# Patient Record
Sex: Male | Born: 1935 | Hispanic: No | Marital: Married | Smoking: Former smoker
Health system: Southern US, Community
[De-identification: ages and names within clinical notes are randomized; demographics above are authoritative.]

## PROBLEM LIST (undated history)

## (undated) DIAGNOSIS — M109 Gout, unspecified: Secondary | ICD-10-CM

## (undated) DIAGNOSIS — I10 Essential (primary) hypertension: Secondary | ICD-10-CM

## (undated) DIAGNOSIS — T8859XA Other complications of anesthesia, initial encounter: Secondary | ICD-10-CM

## (undated) DIAGNOSIS — M1990 Unspecified osteoarthritis, unspecified site: Secondary | ICD-10-CM

## (undated) DIAGNOSIS — T4145XA Adverse effect of unspecified anesthetic, initial encounter: Secondary | ICD-10-CM

## (undated) DIAGNOSIS — C61 Malignant neoplasm of prostate: Secondary | ICD-10-CM

## (undated) HISTORY — DX: Malignant neoplasm of prostate: C61

## (undated) HISTORY — PX: CATARACT EXTRACTION: SUR2

## (undated) HISTORY — PX: PROSTATE BIOPSY: SHX241

---

## 2001-08-20 ENCOUNTER — Encounter: Admission: RE | Admit: 2001-08-20 | Discharge: 2001-08-20 | Payer: Self-pay | Admitting: Internal Medicine

## 2003-05-22 HISTORY — PX: BACK SURGERY: SHX140

## 2009-05-10 ENCOUNTER — Encounter: Admission: RE | Admit: 2009-05-10 | Discharge: 2009-05-10 | Payer: Self-pay | Admitting: Neurosurgery

## 2011-06-22 ENCOUNTER — Other Ambulatory Visit: Payer: Self-pay | Admitting: Internal Medicine

## 2011-06-22 ENCOUNTER — Ambulatory Visit
Admission: RE | Admit: 2011-06-22 | Discharge: 2011-06-22 | Disposition: A | Discharge status: Home / Self Care | Payer: Medicare Other | Source: Ambulatory Visit | Attending: Internal Medicine | Admitting: Internal Medicine

## 2011-06-22 DIAGNOSIS — M79673 Pain in unspecified foot: Secondary | ICD-10-CM

## 2012-06-04 ENCOUNTER — Other Ambulatory Visit: Payer: Self-pay | Admitting: Neurosurgery

## 2012-06-04 DIAGNOSIS — M542 Cervicalgia: Secondary | ICD-10-CM

## 2012-06-07 ENCOUNTER — Ambulatory Visit
Admission: RE | Admit: 2012-06-07 | Discharge: 2012-06-07 | Disposition: A | Discharge status: Home / Self Care | Payer: Medicare Other | Source: Ambulatory Visit | Attending: Neurosurgery | Admitting: Neurosurgery

## 2012-06-07 DIAGNOSIS — M542 Cervicalgia: Secondary | ICD-10-CM

## 2012-08-21 ENCOUNTER — Other Ambulatory Visit: Payer: Self-pay | Admitting: Urology

## 2012-09-18 ENCOUNTER — Encounter (HOSPITAL_COMMUNITY): Payer: Self-pay | Admitting: Pharmacy Technician

## 2012-10-03 NOTE — Patient Instructions (Addendum)
[address] DEVONTA BLANFORD  10/03/2012   Your procedure is scheduled on: 10/10/12  Report to [facility] at 0515 AM.  Call this number if you have problems the morning of surgery [phone]: [phone]   Remember:   Do not eat food or drink liquids After Midnight.     Do not wear jewelry, make-up or nail polish.  Do not wear lotions, powders, or perfumes. You may wear deodorant.  Do not shave 48 hours prior to surgery. Men may shave face and neck.  Do not bring valuables to the hospital.  Contacts, dentures or bridgework may not be worn into surgery.  Leave suitcase in the car. After surgery it may be brought to your room.  For patients admitted to the hospital, checkout time is 11:00 AM the day of discharge.   Please read over the following fact sheets that you were given: MRSA Information.  Birdie Sons, RN  pre op nurse call if needed [phone]    FAILURE TO FOLLOW THESE INSTRUCTIONS MAY RESULT IN CANCELLATION OF YOUR SURGERY   Patient Signature: ___________________________________________

## 2012-10-03 NOTE — Progress Notes (Signed)
Office visit note on chart, EKG 05/09/11 on chart

## 2012-10-06 ENCOUNTER — Encounter (HOSPITAL_COMMUNITY): Payer: Self-pay

## 2012-10-06 ENCOUNTER — Ambulatory Visit (HOSPITAL_COMMUNITY)
Admission: RE | Admit: 2012-10-06 | Discharge: 2012-10-06 | Disposition: A | Discharge status: Home / Self Care | Payer: Medicare Other | Source: Ambulatory Visit | Attending: Urology | Admitting: Urology

## 2012-10-06 ENCOUNTER — Encounter (HOSPITAL_COMMUNITY)
Admission: RE | Admit: 2012-10-06 | Discharge: 2012-10-06 | Disposition: A | Discharge status: Home / Self Care | Payer: Medicare Other | Source: Ambulatory Visit | Attending: Urology | Admitting: Urology

## 2012-10-06 DIAGNOSIS — Z01810 Encounter for preprocedural cardiovascular examination: Secondary | ICD-10-CM | POA: Insufficient documentation

## 2012-10-06 DIAGNOSIS — Z01812 Encounter for preprocedural laboratory examination: Secondary | ICD-10-CM | POA: Insufficient documentation

## 2012-10-06 DIAGNOSIS — Z87891 Personal history of nicotine dependence: Secondary | ICD-10-CM | POA: Insufficient documentation

## 2012-10-06 DIAGNOSIS — I517 Cardiomegaly: Secondary | ICD-10-CM | POA: Insufficient documentation

## 2012-10-06 DIAGNOSIS — I10 Essential (primary) hypertension: Secondary | ICD-10-CM | POA: Insufficient documentation

## 2012-10-06 DIAGNOSIS — Z01818 Encounter for other preprocedural examination: Secondary | ICD-10-CM | POA: Insufficient documentation

## 2012-10-06 HISTORY — DX: Other complications of anesthesia, initial encounter: T88.59XA

## 2012-10-06 HISTORY — DX: Gout, unspecified: M10.9

## 2012-10-06 HISTORY — DX: Essential (primary) hypertension: I10

## 2012-10-06 HISTORY — DX: Adverse effect of unspecified anesthetic, initial encounter: T41.45XA

## 2012-10-06 HISTORY — DX: Unspecified osteoarthritis, unspecified site: M19.90

## 2012-10-06 LAB — BASIC METABOLIC PANEL
BUN: 21 mg/dL (ref 6–23)
CO2: 31 mEq/L (ref 19–32)
Calcium: 9.8 mg/dL (ref 8.4–10.5)
Chloride: 101 mEq/L (ref 96–112)
Creatinine, Ser: 0.85 mg/dL (ref 0.50–1.35)
GFR calc Af Amer: >90 mL/min (ref >90)
GFR calc non Af Amer: 80 mL/min — ABNORMAL LOW (ref >90)
Glucose, Bld: 94 mg/dL (ref 70–99)
Potassium: 4.6 mEq/L (ref 3.5–5.1)
Sodium: 140 mEq/L (ref 135–145)

## 2012-10-06 LAB — SURGICAL PCR SCREEN
MRSA, PCR: NEGATIVE
Staphylococcus aureus: POSITIVE — AB

## 2012-10-06 LAB — CBC
HCT: 42.7 % (ref 39.0–52.0)
Hemoglobin: 15 g/dL (ref 13.0–17.0)
MCH: 32.1 pg (ref 26.0–34.0)
MCHC: 35.1 g/dL (ref 30.0–36.0)
MCV: 91.2 fL (ref 78.0–100.0)
Platelets: 174 10*3/uL (ref 150–400)
RBC: 4.68 MIL/uL (ref 4.22–5.81)
RDW: 12.9 % (ref 11.5–15.5)
WBC: 4.7 10*3/uL (ref 4.0–10.5)

## 2012-10-06 NOTE — Progress Notes (Signed)
10/06/12 0929  OBSTRUCTIVE SLEEP APNEA  Have you ever been diagnosed with sleep apnea through a sleep study? No  Do you snore loudly (loud enough to be heard through closed doors)?  1  Do you often feel tired, fatigued, or sleepy during the daytime? 0  Has anyone observed you stop breathing during your sleep? 0  Do you have, or are you being treated for high blood pressure? 1  BMI more than 35 kg/m2? 0  Age over 77 years old? 1  Neck circumference greater than 40 cm/18 inches? 0  Gender: 1  Obstructive Sleep Apnea Score 4  Score 4 or greater  Results sent to PCP

## 2012-10-09 DIAGNOSIS — N401 Enlarged prostate with lower urinary tract symptoms: Secondary | ICD-10-CM

## 2012-10-09 DIAGNOSIS — N138 Other obstructive and reflux uropathy: Secondary | ICD-10-CM

## 2012-10-09 NOTE — H&P (Signed)
Seth Mccall is a 79 year old male Seth Mccall with a history of BPH and outlet obstruction.   History of Present Illness     BPH with LUTS: Seth Mccall has obstructive voiding symptoms consisting primarily of nocturia and slow urinary stream. In addition Seth Mccall reported that Seth Mccall had daytime frequency and fairly significant urgency. These symptoms have been present for about 3 years. Seth Mccall has tried Avodart but stopped that after Seth Mccall learned about the black box warning for that medication. Seth Mccall said after stopping the medicine Seth Mccall noted no real change in his voiding pattern. Seth Mccall said Seth Mccall also took tamsulosin but stopped that about a year ago as Seth Mccall noted no improvement with this medicine either. Seth Mccall said Seth Mccall is primarily bothered by his urgency. Seth Mccall had urodynamics in 11/11 which revealed a large capacity bladder of 700 cc with mild bladder instability at large volumes as well as some degree of outlet obstruction.  Seth Mccall underwent transurethral microwave therapy on 04/23/11.      Seth Mccall has had a history of kidney stones in the distant past but has not had any flank pain or hematuria for some time.   Interval history: Seth Mccall continues to be bothered by obstructive voiding symptoms. Seth Mccall came in today to discuss proceeding with surgical therapy.    Past Medical History Problems  1. History of  Edema Of The Penis 607.83 2. History of  Hypertension 401.9 3. History of  Nephrolithiasis V13.01 4. History of  Spinal Stenosis 724.00  Surgical History Problems  1. History of  Spine Repair  Current Meds 1. Aspirin 81 MG Oral Tablet; Therapy: (Recorded:14Nov2011) to 2. Caltrate 600+D TABS; Therapy: (Recorded:14Nov2011) to 3. Centrum Silver Oral Tablet; Therapy: (Recorded:14Nov2011) to 4. Colcrys 0.6 MG Oral Tablet; Therapy: 21Jan2013 to 5. Lisinopril 10 MG Oral Tablet; Therapy: (Recorded:14Nov2011) to 6. Rapaflo 8 MG Oral Capsule; TAKE 1 CAPSULE Bedtime; Therapy: 07Dec2011 to  (Evaluate:21Dec2012); Last Rx:22Dec2011 7.  Sulfamethoxazole-Trimethoprim 400-80 MG Oral Tablet; Take 1 tablet twice daily; Therapy:  16Dec2013 to (Last Rx:16Dec2013)  Requested for: 16Dec2013  Allergies Medication  1. No Known Drug Allergies  Family History Problems  1. Family history of  Family Health Status - Mother's Age Mother lives in [place] and is 104. 2. Family history of  Family Health Status Number Of Children 4 children 3. Family history of  Father Deceased At Age ____ Died at 61 from unknown causes.  Social History Problems  1. Alcohol Use 2. Caffeine Use 2 cups a day 3. Marital History - Currently Married 4. Never A Smoker Denied  5. History of  Former Smoker 6. History of  Tobacco Use  Review of Systems Genitourinary, constitutional, skin, eye, otolaryngeal, hematologic/lymphatic, cardiovascular, pulmonary, endocrine, musculoskeletal, gastrointestinal, neurological and psychiatric system(s) were reviewed and pertinent findings if present are noted.  Genitourinary: urinary frequency, feelings of urinary urgency, nocturia, difficulty starting the urinary stream, weak urinary stream, post-void dribbling and erectile dysfunction.  Gastrointestinal: constipation.    Vitals Vital Signs Height Weight BMI BSA  Weight: 150 lb  Blood Pressure  Blood Pressure: 145 / 76 Temperature  Temperature: 97.9 F Pulse  Heart Rate: 83  Physical Exam Constitutional: Well nourished and well developed. No acute distress.  ENT:. The ears and nose are normal in appearance.  Neck: The appearance of the neck is normal and no neck mass is present.  Pulmonary: No respiratory distress and normal respiratory rhythm and effort.  Cardiovascular: Heart rate and rhythm are normal. No peripheral edema.  Abdomen: The abdomen is soft and  nontender. No masses are palpated. No CVA tenderness. No hernias are palpable. No hepatosplenomegaly noted.  Rectal: Rectal exam demonstrates normal sphincter tone, no tenderness and no masses. The  prostate has no nodularity and is not tender. The left seminal vesicle is nonpalpable. The right seminal vesicle is nonpalpable. The perineum is normal on inspection.  Genitourinary: Examination of the penis demonstrates no discharge, no masses, no lesions and a normal meatus. The penis is uncircumcised. The scrotum is without lesions. The right epididymis is palpably normal and non-tender. The left epididymis is found to have a spermatocele, but non-tender. The right testis is non-tender and without masses. The left testis is non-tender and without masses.  Lymphatics: The femoral and inguinal nodes are not enlarged or tender.  Skin: Normal skin turgor, no visible rash and no visible skin lesions.  Neuro/Psych:. Mood and affect are appropriate.   The following clinical lab reports were reviewed:  The urine was clear.    Assessment Assessed  1. Benign Prostatic Hypertrophy With Urinary Obstruction 600.01   I long discussion with the Seth Mccall about proceeding with surgical treatment of his outlet obstruction. Seth Mccall was given information about transurethral resection of the prostate previously and is reviewed this. Seth Mccall would like to proceed with surgery. I therefore went over the procedure with him and also discussed performing the procedure with the greenlight laser. We discussed the pluses and minuses of each of these forms of therapy. I also went over the risks and potential complications. Seth Mccall understands and would like to proceed with greenlight laser ablation of his prostate. We discussed the probability of success, the anticipated postoperative course and Seth Mccall indicated that Seth Mccall would have his daughter available to assist him postoperatively.   Plan    Seth Mccall will be scheduled for a greenlight laser ablation of the prostate.

## 2012-10-10 ENCOUNTER — Encounter (HOSPITAL_COMMUNITY)
Admission: RE | Disposition: A | Discharge status: Home / Self Care | Payer: Self-pay | Source: Ambulatory Visit | Attending: Urology

## 2012-10-10 ENCOUNTER — Ambulatory Visit (HOSPITAL_COMMUNITY)
Admission: RE | Admit: 2012-10-10 | Discharge: 2012-10-11 | Disposition: A | Discharge status: Home / Self Care | Payer: Medicare Other | Source: Ambulatory Visit | Attending: Urology | Admitting: Urology

## 2012-10-10 ENCOUNTER — Encounter (HOSPITAL_COMMUNITY): Payer: Self-pay | Admitting: *Deleted

## 2012-10-10 ENCOUNTER — Encounter (HOSPITAL_COMMUNITY): Payer: Self-pay | Admitting: Certified Registered Nurse Anesthetist

## 2012-10-10 ENCOUNTER — Ambulatory Visit (HOSPITAL_COMMUNITY): Payer: Medicare Other | Admitting: Certified Registered Nurse Anesthetist

## 2012-10-10 DIAGNOSIS — N138 Other obstructive and reflux uropathy: Secondary | ICD-10-CM | POA: Insufficient documentation

## 2012-10-10 DIAGNOSIS — R3915 Urgency of urination: Secondary | ICD-10-CM | POA: Insufficient documentation

## 2012-10-10 DIAGNOSIS — Z79899 Other long term (current) drug therapy: Secondary | ICD-10-CM | POA: Insufficient documentation

## 2012-10-10 DIAGNOSIS — Z87442 Personal history of urinary calculi: Secondary | ICD-10-CM | POA: Insufficient documentation

## 2012-10-10 DIAGNOSIS — N139 Obstructive and reflux uropathy, unspecified: Secondary | ICD-10-CM | POA: Insufficient documentation

## 2012-10-10 DIAGNOSIS — K5900 Constipation, unspecified: Secondary | ICD-10-CM | POA: Insufficient documentation

## 2012-10-10 DIAGNOSIS — R350 Frequency of micturition: Secondary | ICD-10-CM | POA: Insufficient documentation

## 2012-10-10 DIAGNOSIS — R351 Nocturia: Secondary | ICD-10-CM | POA: Insufficient documentation

## 2012-10-10 DIAGNOSIS — N401 Enlarged prostate with lower urinary tract symptoms: Secondary | ICD-10-CM

## 2012-10-10 DIAGNOSIS — Z7982 Long term (current) use of aspirin: Secondary | ICD-10-CM | POA: Insufficient documentation

## 2012-10-10 DIAGNOSIS — I10 Essential (primary) hypertension: Secondary | ICD-10-CM | POA: Insufficient documentation

## 2012-10-10 DIAGNOSIS — N529 Male erectile dysfunction, unspecified: Secondary | ICD-10-CM | POA: Insufficient documentation

## 2012-10-10 HISTORY — PX: TRANSURETHRAL RESECTION OF PROSTATE: SHX73

## 2012-10-10 HISTORY — PX: GREEN LIGHT LASER TURP (TRANSURETHRAL RESECTION OF PROSTATE: SHX6260

## 2012-10-10 SURGERY — GREEN LIGHT LASER TURP (TRANSURETHRAL RESECTION OF PROSTATE
Anesthesia: General | Wound class: Clean Contaminated

## 2012-10-10 MED ORDER — OXYCODONE HCL 5 MG PO TABS: 5.0000 mg | ORAL_TABLET | Freq: Once | ORAL | Status: DC | PRN

## 2012-10-10 MED ORDER — PROMETHAZINE HCL 25 MG/ML IJ SOLN: 6.2500 mg | INTRAMUSCULAR | Status: DC | PRN

## 2012-10-10 MED ORDER — FENTANYL CITRATE 0.05 MG/ML IJ SOLN
INTRAMUSCULAR | Status: DC | PRN
  Administered 2012-10-10 (×2): 50 ug via INTRAVENOUS

## 2012-10-10 MED ORDER — HYDROMORPHONE HCL PF 1 MG/ML IJ SOLN: 0.2500 mg | INTRAMUSCULAR | Status: DC | PRN

## 2012-10-10 MED ORDER — OXYCODONE HCL 5 MG/5ML PO SOLN
5.0000 mg | Freq: Once | ORAL | Status: DC | PRN
  Filled 2012-10-10: qty 5

## 2012-10-10 MED ORDER — CIPROFLOXACIN IN D5W 200 MG/100ML IV SOLN
200.0000 mg | INTRAVENOUS | Status: AC
  Administered 2012-10-10: 200 mg via INTRAVENOUS
  Filled 2012-10-10: qty 100

## 2012-10-10 MED ORDER — SODIUM CHLORIDE 0.9 % IV SOLN
INTRAVENOUS | Status: DC
  Administered 2012-10-10: 100 mL/h via INTRAVENOUS

## 2012-10-10 MED ORDER — MEPERIDINE HCL 50 MG/ML IJ SOLN: 6.2500 mg | INTRAMUSCULAR | Status: DC | PRN

## 2012-10-10 MED ORDER — SODIUM CHLORIDE 0.9 % IR SOLN
Status: DC | PRN
  Administered 2012-10-10: 6000 mL

## 2012-10-10 MED ORDER — SODIUM CHLORIDE 0.9 % IR SOLN
Status: DC | PRN
  Administered 2012-10-10: 1000 mL

## 2012-10-10 MED ORDER — EPHEDRINE SULFATE 50 MG/ML IJ SOLN
INTRAMUSCULAR | Status: DC | PRN
  Administered 2012-10-10 (×2): 10 mg via INTRAVENOUS

## 2012-10-10 MED ORDER — ACETAMINOPHEN 10 MG/ML IV SOLN
INTRAVENOUS | Status: DC | PRN
  Administered 2012-10-10: 1000 mg via INTRAVENOUS

## 2012-10-10 MED ORDER — HYDROCODONE-ACETAMINOPHEN 7.5-325 MG PO TABS: 1.0000 | ORAL_TABLET | ORAL | Status: DC | PRN

## 2012-10-10 MED ORDER — ACETAMINOPHEN 325 MG PO TABS: 650.0000 mg | ORAL_TABLET | ORAL | Status: DC | PRN

## 2012-10-10 MED ORDER — ACETAMINOPHEN 10 MG/ML IV SOLN: 1000.0000 mg | Freq: Once | INTRAVENOUS | Status: DC | PRN

## 2012-10-10 MED ORDER — CIPROFLOXACIN IN D5W 200 MG/100ML IV SOLN
200.0000 mg | Freq: Two times a day (BID) | INTRAVENOUS | Status: AC
  Administered 2012-10-10 – 2012-10-11 (×2): 200 mg via INTRAVENOUS
  Filled 2012-10-10 (×2): qty 100

## 2012-10-10 MED ORDER — HYDROCODONE-ACETAMINOPHEN 5-325 MG PO TABS: 1.0000 | ORAL_TABLET | ORAL | Status: DC | PRN

## 2012-10-10 MED ORDER — LACTATED RINGERS IV SOLN
INTRAVENOUS | Status: DC | PRN
  Administered 2012-10-10 (×2): via INTRAVENOUS

## 2012-10-10 MED ORDER — STERILE WATER FOR IRRIGATION IR SOLN
Status: DC | PRN
  Administered 2012-10-10: 500 mL

## 2012-10-10 MED ORDER — LIDOCAINE HCL (CARDIAC) 20 MG/ML IV SOLN
INTRAVENOUS | Status: DC | PRN
  Administered 2012-10-10: 50 mg via INTRAVENOUS

## 2012-10-10 MED ORDER — ZOLPIDEM TARTRATE 5 MG PO TABS: 5.0000 mg | ORAL_TABLET | Freq: Every evening | ORAL | Status: DC | PRN

## 2012-10-10 MED ORDER — BELLADONNA ALKALOIDS-OPIUM 16.2-60 MG RE SUPP: 1.0000 | Freq: Four times a day (QID) | RECTAL | Status: DC | PRN

## 2012-10-10 MED ORDER — PHENAZOPYRIDINE HCL 200 MG PO TABS: 200.0000 mg | ORAL_TABLET | Freq: Three times a day (TID) | ORAL | Status: DC | PRN

## 2012-10-10 MED ORDER — ACETAMINOPHEN 10 MG/ML IV SOLN
INTRAVENOUS | Status: AC
  Filled 2012-10-10: qty 100

## 2012-10-10 MED ORDER — PROPOFOL 10 MG/ML IV BOLUS
INTRAVENOUS | Status: DC | PRN
  Administered 2012-10-10: 160 mg via INTRAVENOUS

## 2012-10-10 MED ORDER — ONDANSETRON HCL 4 MG/2ML IJ SOLN: 4.0000 mg | INTRAMUSCULAR | Status: DC | PRN

## 2012-10-10 MED ORDER — BACITRACIN-NEOMYCIN-POLYMYXIN 400-5-5000 EX OINT: 1.0000 "application " | TOPICAL_OINTMENT | Freq: Three times a day (TID) | CUTANEOUS | Status: DC | PRN

## 2012-10-10 SURGICAL SUPPLY — 22 items
BAG URINE DRAINAGE (UROLOGICAL SUPPLIES) ×2 IMPLANT
BAG URO CATCHER STRL LF (DRAPE) ×2 IMPLANT
CATH FOLEY 2WAY SLVR  5CC 18FR (CATHETERS) ×1
CATH FOLEY 2WAY SLVR 30CC 22FR (CATHETERS) IMPLANT
CATH FOLEY 2WAY SLVR 5CC 18FR (CATHETERS) IMPLANT
CLOTH BEACON ORANGE TIMEOUT ST (SAFETY) ×2 IMPLANT
DRAPE CAMERA CLOSED 9X96 (DRAPES) ×2 IMPLANT
ELECT BUTTON HF 24-28F 2 30DE (ELECTRODE) IMPLANT
ELECT LOOP MED HF 24F 12D (CUTTING LOOP) IMPLANT
ELECT LOOP MED HF 24F 12D CBL (CLIP) IMPLANT
ELECT RESECT VAPORIZE 12D CBL (ELECTRODE) IMPLANT
FEE RENTAL LASER GREENLIGHT (Laser) IMPLANT
GLOVE BIO SURGEON STRL SZ8 (GLOVE) ×1 IMPLANT
GLOVE BIOGEL M STRL SZ7.5 (GLOVE) ×1 IMPLANT
GOWN STRL REIN XL XLG (GOWN DISPOSABLE) ×1 IMPLANT
HOLDER FOLEY CATH W/STRAP (MISCELLANEOUS) ×1 IMPLANT
LASER FIBER /GREENLIGHT LASER (Laser) ×1 IMPLANT
LASER GREENLIGHT RENTAL P/PROC (Laser) ×2 IMPLANT
PACK CYSTO (CUSTOM PROCEDURE TRAY) ×2 IMPLANT
SYR 30ML LL (SYRINGE) IMPLANT
SYRINGE IRR TOOMEY STRL 70CC (SYRINGE) IMPLANT
TUBING CONNECTING 10 (TUBING) ×2 IMPLANT

## 2012-10-10 NOTE — Op Note (Signed)
PATIENT:  Seth Mccall  PRE-OPERATIVE DIAGNOSIS:  BPH with outlet obstruction  POST-OPERATIVE DIAGNOSIS: Same  PROCEDURE: Green light laser vaporization of prostate  SURGEON:  Garnett Farm  INDICATION: Seth Mccall is a 79 year old male with long-standing bladder outlet obstruction secondary to BPH. He had undergone a previous minimally invasive procedure without significant improvement in his voiding pattern. We discussed the treatment options and he has elected to proceed with surgery.  ANESTHESIA:  General  EBL:  Minimal  DRAINS: 18 French Foley  LOCAL MEDICATIONS USED:  None  SPECIMEN:  None  Description of procedure: After informed consent the patient was brought to the major OR, placed on the table and administered general anesthesia. He was then moved to the dorsal lithotomy position and his genitalia was sterilely prepped and draped. An official timeout was then performed. He received preoperative antibiotics and had PAS hose on.  Cystoscopy was initially performed with the rigid cystoscope and 30 lens. Ureter was noted be normal down to the sphincter which appeared intact. The prostatic urethra revealed a fairly short urethra but a median lobe component with some lateral lobe hypertrophy. The bladder was then entered and fully inspected there were no tumors, stones or inflammatory lesions. One plus trabeculation was noted. Ureteral orifices were noted to be of normal configuration and position and well away from the bladder neck.  The greenlight laser was then inserted and I began vaporizing the adenomatous tissue using 60 W setting at the bladder neck from the 5 to 7:00 position down to capsular fibers and then back to the level of the veru. I then addressed the lateral lobes. I first vaporized the right lobe using 120 W of energy. Once this was completely vaporized I then addressed the left lobe. I then reduced the wattage back down to 80 W and treated with  the anterior tissue and then further reduced of energy down to 60 W and treated tissue near the apex remaining proximal to the veru at all times. I then fulgurated a single bleeder. I then left the bladder filled and removed the cystoscope. There was good flow of urine and this remained clear. I therefore inserted an 62 French Foley catheter. This was connected to closed system drainage after irrigation revealed clear return. Total laser time was 12 minutes and 49 seconds. The patient tolerated procedure well no intraoperative complications.  PLAN OF CARE: Due to his age he will be observed overnight with anticipated discharge in the morning after a voiding trial.  PATIENT DISPOSITION:  PACU - hemodynamically stable.

## 2012-10-10 NOTE — Progress Notes (Signed)
Patient ID: Seth Mccall, male   DOB: March 31, 1930, 79 y.o.   MRN: [id number] Postoperative check.  He had no specific complaints. No abdominal discomfort was reported.  He was afebrile with stable vital signs. His urine was completely clear with no clots. His abdomen was soft and nontender.  He is status post green light laser prostate ablation and appears to be doing well.  1. He is scheduled to have his catheter removed first thing in the morning. 2. He may be discharged home in the morning. His prescriptions have been written, followup entered and instructions entered.

## 2012-10-10 NOTE — Interval H&P Note (Signed)
History and Physical Interval Note:  10/10/2012 7:12 AM  Seth Mccall  has presented today for surgery, with the diagnosis of BPH  The various methods of treatment have been discussed with the patient and family. After consideration of risks, benefits and other options for treatment, the patient has consented to  Procedure(s): GREEN LIGHT LASER TURP (TRANSURETHRAL RESECTION OF PROSTATE (N/A) as a surgical intervention .  The patient's history has been reviewed, patient examined, no change in status, stable for surgery.  I have reviewed the patient's chart and labs.  Questions were answered to the patient's satisfaction.     Garnett Farm

## 2012-10-10 NOTE — Anesthesia Preprocedure Evaluation (Addendum)
Anesthesia Evaluation  Patient identified by MRN, date of birth, ID band Patient awake    Reviewed: Allergy & Precautions, H&P , NPO status , Patient's Chart, lab work & pertinent test results  History of Anesthesia Complications Negative for: history of anesthetic complications  Airway Mallampati: II TM Distance: >3 FB Neck ROM: Full    Dental  (+) Dental Advisory Given and Teeth Intact   Pulmonary former smoker,  breath sounds clear to auscultation        Cardiovascular hypertension, Pt. on medications Rhythm:Regular Rate:Normal     Neuro/Psych negative neurological ROS  negative psych ROS   GI/Hepatic negative GI ROS, Neg liver ROS,   Endo/Other  negative endocrine ROS  Renal/GU negative Renal ROS     Musculoskeletal negative musculoskeletal ROS (+)   Abdominal   Peds  Hematology negative hematology ROS (+)   Anesthesia Other Findings   Reproductive/Obstetrics                          Anesthesia Physical Anesthesia Plan  ASA: II  Anesthesia Plan: General   Post-op Pain Management:    Induction: Intravenous  Airway Management Planned: LMA  Additional Equipment:   Intra-op Plan:   Post-operative Plan: Extubation in OR  Informed Consent: I have reviewed the patients History and Physical, chart, labs and discussed the procedure including the risks, benefits and alternatives for the proposed anesthesia with the patient or authorized representative who has indicated his/her understanding and acceptance.   Dental advisory given  Plan Discussed with: CRNA  Anesthesia Plan Comments:         Anesthesia Quick Evaluation

## 2012-10-10 NOTE — Anesthesia Postprocedure Evaluation (Signed)
Anesthesia Post Note  Patient: Seth Mccall  Procedure(s) Performed: Procedure(s) (LRB): GREEN LIGHT LASER TURP (TRANSURETHRAL RESECTION OF PROSTATE (N/A)  Anesthesia type: General  Patient location: PACU  Post pain: Pain level controlled  Post assessment: Post-op Vital signs reviewed  Last Vitals: BP 108/55  Pulse 64  Temp(Src) 36.3 C (Oral)  Resp 20  Ht 5\' 3"  (1.6 m)  Wt 161 lb (73.029 kg)  BMI 28.53 kg/m2  SpO2 98%  Post vital signs: Reviewed  Level of consciousness: sedated  Complications: No apparent anesthesia complications

## 2012-10-10 NOTE — Transfer of Care (Signed)
Immediate Anesthesia Transfer of Care Note  Patient: Seth Mccall  Procedure(s) Performed: Procedure(s): GREEN LIGHT LASER TURP (TRANSURETHRAL RESECTION OF PROSTATE (N/A)  Patient Location: PACU  Anesthesia Type:General  Level of Consciousness: awake and alert   Airway & Oxygen Therapy: Patient Spontanous Breathing and Patient connected to face mask oxygen  Post-op Assessment: Report given to PACU RN and Post -op Vital signs reviewed and stable  Post vital signs: Reviewed and stable  Complications: No apparent anesthesia complications

## 2012-10-11 ENCOUNTER — Emergency Department (HOSPITAL_COMMUNITY)
Admission: EM | Admit: 2012-10-11 | Discharge: 2012-10-11 | Disposition: A | Discharge status: Home / Self Care | Payer: Medicare Other | Attending: Emergency Medicine | Admitting: Emergency Medicine

## 2012-10-11 ENCOUNTER — Encounter (HOSPITAL_COMMUNITY): Payer: Self-pay

## 2012-10-11 DIAGNOSIS — I10 Essential (primary) hypertension: Secondary | ICD-10-CM | POA: Insufficient documentation

## 2012-10-11 DIAGNOSIS — R109 Unspecified abdominal pain: Secondary | ICD-10-CM | POA: Insufficient documentation

## 2012-10-11 DIAGNOSIS — M109 Gout, unspecified: Secondary | ICD-10-CM | POA: Insufficient documentation

## 2012-10-11 DIAGNOSIS — Z7982 Long term (current) use of aspirin: Secondary | ICD-10-CM | POA: Insufficient documentation

## 2012-10-11 DIAGNOSIS — R339 Retention of urine, unspecified: Secondary | ICD-10-CM | POA: Insufficient documentation

## 2012-10-11 DIAGNOSIS — Z87891 Personal history of nicotine dependence: Secondary | ICD-10-CM | POA: Insufficient documentation

## 2012-10-11 DIAGNOSIS — Z79899 Other long term (current) drug therapy: Secondary | ICD-10-CM | POA: Insufficient documentation

## 2012-10-11 DIAGNOSIS — M129 Arthropathy, unspecified: Secondary | ICD-10-CM | POA: Insufficient documentation

## 2012-10-11 DIAGNOSIS — Z9889 Other specified postprocedural states: Secondary | ICD-10-CM | POA: Insufficient documentation

## 2012-10-11 LAB — URINALYSIS, ROUTINE W REFLEX MICROSCOPIC
Bilirubin Urine: NEGATIVE
Leukocytes, UA: NEGATIVE
Nitrite: NEGATIVE
Specific Gravity, Urine: 1.008 (ref 1.005–1.030)
Urobilinogen, UA: 0.2 mg/dL (ref 0.0–1.0)

## 2012-10-11 LAB — POCT I-STAT, CHEM 8
Calcium, Ion: 1.19 mmol/L (ref 1.13–1.30)
Glucose, Bld: 104 mg/dL — ABNORMAL HIGH (ref 70–99)
HCT: 43 % (ref 39.0–52.0)
Hemoglobin: 14.6 g/dL (ref 13.0–17.0)
Potassium: 4.8 mEq/L (ref 3.5–5.1)

## 2012-10-11 LAB — URINE MICROSCOPIC-ADD ON

## 2012-10-11 NOTE — ED Provider Notes (Signed)
History    CSN: [id number] Arrival date & time 10/11/12  1656 First MD Initiated Contact with Patient 10/11/12 1731    Chief Complaint  Patient presents with  . Urinary Retention   HPI Patient presents to the emergency room with complaints of urinary retention. The patient had a TURP procedure yesterday by Dr. Vernie Ammons.  He had his Foley catheter removed today at 11 AM. Since that time he has not been able to urinate. Patient states his bladder feels extremely full and distended. He also feels like he has to have a bowel movement now. He does have pain in his lower abdomen associated with that inability to urinate. He has not had any fever. He has not had any chills. He has not had any vomiting or diarrhea.  Past Medical History  Diagnosis Date  . Hypertension   . Arthritis   . Gout   . Complication of anesthesia     limited movement in neck because of fusion    Past Surgical History  Procedure Laterality Date  . Back surgery  2005    fusion of C3, C4, and C5  . Cataract extraction Bilateral   . Transurethral resection of prostate  10-10-2012    History reviewed. No pertinent family history.  History  Substance Use Topics  . Smoking status: Former Smoker -- 0.50 packs/day for 4 years    Types: Cigarettes    Quit date: 05/21/1968  . Smokeless tobacco: Never Used  . Alcohol Use: Yes     Comment: occasional      Review of Systems  All other systems reviewed and are negative.    Allergies  Review of patient's allergies indicates no known allergies.  Home Medications   Current Outpatient Rx  Name  Route  Sig  Dispense  Refill  . aspirin EC 81 MG tablet   Oral   Take 81 mg by mouth every other day.         . colchicine 0.6 MG tablet   Oral   Take 0.6 mg by mouth every other day.         . lisinopril (PRINIVIL,ZESTRIL) 10 MG tablet   Oral   Take 10 mg by mouth every morning. Take with a 5 mg tablet to make 15 mg         . lisinopril (PRINIVIL,ZESTRIL) 5 MG  tablet   Oral   Take 5 mg by mouth every morning. Take with a 10 mg tablet to make 15 mg         . Multiple Vitamin (MULTIVITAMIN WITH MINERALS) TABS   Oral   Take 1 tablet by mouth daily.         . phenazopyridine (PYRIDIUM) 200 MG tablet   Oral   Take 1 tablet (200 mg total) by mouth 3 (three) times daily as needed for pain.   30 tablet   0   . HYDROcodone-acetaminophen (NORCO) 7.5-325 MG per tablet   Oral   Take 1-2 tablets by mouth every 4 (four) hours as needed for pain.   40 tablet   0     BP 168/92  Pulse 77  Temp(Src) 98.3 F (36.8 C) (Oral)  Resp 20  SpO2 98%  Physical Exam  Nursing note and vitals reviewed. Constitutional: He appears well-developed and well-nourished. No distress.  HENT:  Head: Normocephalic and atraumatic.  Right Ear: External ear normal.  Left Ear: External ear normal.  Eyes: Conjunctivae are normal. Right eye exhibits no discharge. Left eye  exhibits no discharge. No scleral icterus.  Neck: Neck supple. No tracheal deviation present.  Cardiovascular: Normal rate, regular rhythm and intact distal pulses.   Pulmonary/Chest: Effort normal and breath sounds normal. No stridor. No respiratory distress. He has no wheezes. He has no rales.  Abdominal: Soft. Bowel sounds are normal. He exhibits mass. He exhibits no distension. There is tenderness in the suprapubic area. There is guarding. There is no rebound. No hernia.  Palpable mass in the suprapubic region consistent with a distended bladder  Musculoskeletal: He exhibits no edema and no tenderness.  Neurological: He is alert. He has normal strength. No sensory deficit. Cranial nerve deficit:  no gross defecits noted. He exhibits normal muscle tone. He displays no seizure activity. Coordination normal.  Skin: Skin is warm and dry. No rash noted.  Psychiatric: He has a normal mood and affect.    ED Course  Procedures (including critical care time) ED Course Foley catheter (coude) inserted  by RN without difficulty.  Pt tolerated well.  Large volume of urine obtained.  Approx 800 cc.   Labs Reviewed  URINALYSIS, ROUTINE W REFLEX MICROSCOPIC - Abnormal; Notable for the following:    Hgb urine dipstick MODERATE (*)    All other components within normal limits  POCT I-STAT, CHEM 8 - Abnormal; Notable for the following:    Glucose, Bld 104 (*)    All other components within normal limits  URINE MICROSCOPIC-ADD ON   No results found.   1. Urinary retention       MDM  Pt had urinary retention following his recent surgery. He improved following catheter insertion.  Discussed with Dr. Laverle Patter.  Will follow up in the office with Dr Vernie Ammons next week.  Pt will leave with a catheter in place.         Celene Kras, MD 10/11/12 [phone]

## 2012-10-11 NOTE — ED Notes (Signed)
Coude catheter inserted by P. Crawford Givens, RN

## 2012-10-11 NOTE — Progress Notes (Signed)
Patient ID: Jameis Ruben Im, male   DOB: 1929-06-07, 79 y.o.   MRN: [id number]  Date of admission: 10/10/2012  Date of discharge: 10/11/2012  Admission diagnosis: Bladder outlet obstruction  Discharge diagnosis: Same  History and Physical: For full details, please see admission history and physical. Briefly, Banner LOCKLAN CANOY is a 79 y.o. year old patient with bladder outlet obstruction.   Hospital Course: He underwent TUVP of the prostate yesterday.  His urine remained clear and his catheter was removed for a voiding trial which he was able to pass.  He was felt to be stable for discharge home.  Disposition: Home  Discharge instruction: The patient was instructed to be ambulatory but told to refrain from heavy lifting, strenuous activity, or driving.   Discharge medications:    Medication List    STOP taking these medications       silodosin 8 MG Caps capsule  Commonly known as:  RAPAFLO      TAKE these medications       aspirin EC 81 MG tablet  Take 81 mg by mouth every other day.     colchicine 0.6 MG tablet  Take 0.6 mg by mouth every other day.     HYDROcodone-acetaminophen 7.5-325 MG per tablet  Commonly known as:  NORCO  Take 1-2 tablets by mouth every 4 (four) hours as needed for pain.     lisinopril 10 MG tablet  Commonly known as:  PRINIVIL,ZESTRIL  Take 10 mg by mouth every morning. Take with a 5 mg tablet to make 15 mg     lisinopril 5 MG tablet  Commonly known as:  PRINIVIL,ZESTRIL  Take 5 mg by mouth every morning. Take with a 10 mg tablet to make 15 mg     multivitamin with minerals Tabs  Take 1 tablet by mouth daily.     phenazopyridine 200 MG tablet  Commonly known as:  PYRIDIUM  Take 1 tablet (200 mg total) by mouth 3 (three) times daily as needed for pain.        Followup:      Follow-up Information   Follow up with Garnett Farm, MD On 10/20/2012. (at 11:00)    Contact information:   [address] [facility] [place]  [address] [phone]

## 2012-10-11 NOTE — Progress Notes (Signed)
Patient discharge to home, daughter at bedside. Alel to urinate 2x prior to discharge light yellow . Patient no complaints of any pain or discomfort. Discharge teaching and follow up appointment done and was given to the patient.

## 2012-10-11 NOTE — ED Notes (Signed)
He states he underwent T.U.R.P. Yesterday by Dr. Vernie Ammons.  He states he was released today, and was able to void this morning; but unable to do so since 1300 hours today.  He states he feels "real full; and I feel like I need to have a b.m.".

## 2012-10-11 NOTE — ED Notes (Signed)
Asked P.Crawford Givens, RN to attempt catheter insertion.

## 2012-10-14 ENCOUNTER — Encounter (HOSPITAL_COMMUNITY): Payer: Self-pay | Admitting: Urology

## 2014-09-21 ENCOUNTER — Other Ambulatory Visit: Payer: Self-pay | Admitting: Internal Medicine

## 2014-09-21 DIAGNOSIS — Z9181 History of falling: Secondary | ICD-10-CM

## 2014-09-21 DIAGNOSIS — R42 Dizziness and giddiness: Secondary | ICD-10-CM

## 2014-09-22 ENCOUNTER — Other Ambulatory Visit: Payer: Self-pay

## 2014-09-22 ENCOUNTER — Ambulatory Visit
Admission: RE | Admit: 2014-09-22 | Discharge: 2014-09-22 | Disposition: A | Discharge status: Home / Self Care | Payer: Medicare Other | Source: Ambulatory Visit | Attending: Internal Medicine | Admitting: Internal Medicine

## 2014-09-22 DIAGNOSIS — Z9181 History of falling: Secondary | ICD-10-CM

## 2014-09-22 DIAGNOSIS — R42 Dizziness and giddiness: Secondary | ICD-10-CM

## 2015-04-25 ENCOUNTER — Ambulatory Visit: Payer: PPO | Admitting: Radiation Oncology

## 2015-04-27 ENCOUNTER — Encounter: Payer: Self-pay | Admitting: Radiation Oncology

## 2015-04-27 NOTE — Progress Notes (Signed)
GU Location of Tumor / Histology: prostatic adenocarcinoma   If Prostate Cancer, Gleason Score is (3 + 4) and PSA is (7.32) August 2016  Emeril H Quinney has been followed by Dr. Karsten Ro since May 2014 for s/p TURP for BPH.  Biopsies of prostate (if applicable) revealed:    Past/Anticipated interventions by urology, if any: Transurethral microwave therapy on 04/23/11 and greenlight laser vaporization 10/10/12. Prostate biopsy. Referral to Dr. Tammi Klippel.  Past/Anticipated interventions by medical oncology, if any: no  Weight changes, if any: no  Bowel/Bladder complaints, if any: nocturia, slow urinary stream, daytime frequency, fairly significant urgency (pt.primary concern).   Nausea/Vomiting, if any: no  Pain issues, if any:  no  SAFETY ISSUES:  Prior radiation? no  Pacemaker/ICD? no  Possible current pregnancy? no  Is the patient on methotrexate? no  Current Complaints / other details:  81 year old male. Married with four children. Mother 104 lives in [place]. Former smoker. Prostate volume 13.4 cc. Stopped Flomax and Avodart after he saw no change in his LUTS. He has previous prostate surgery which could make radioactive seed implantation more challenging.

## 2015-04-28 ENCOUNTER — Ambulatory Visit: Payer: PPO | Admitting: Radiation Oncology

## 2015-04-28 ENCOUNTER — Ambulatory Visit
Admission: RE | Admit: 2015-04-28 | Discharge: 2015-04-28 | Disposition: A | Discharge status: Home / Self Care | Payer: PPO | Source: Ambulatory Visit | Attending: Radiation Oncology | Admitting: Radiation Oncology

## 2015-04-28 ENCOUNTER — Ambulatory Visit: Payer: Medicare Other

## 2015-04-28 ENCOUNTER — Encounter: Payer: Self-pay | Admitting: Radiation Oncology

## 2015-04-28 VITALS — BP 144/70 | HR 64 | Temp 98.2°F | Resp 12 | Wt 160.0 lb

## 2015-04-28 DIAGNOSIS — C61 Malignant neoplasm of prostate: Secondary | ICD-10-CM | POA: Diagnosis not present

## 2015-04-28 DIAGNOSIS — Z87891 Personal history of nicotine dependence: Secondary | ICD-10-CM | POA: Diagnosis not present

## 2015-04-28 DIAGNOSIS — M109 Gout, unspecified: Secondary | ICD-10-CM | POA: Diagnosis not present

## 2015-04-28 DIAGNOSIS — Z79899 Other long term (current) drug therapy: Secondary | ICD-10-CM | POA: Diagnosis not present

## 2015-04-28 DIAGNOSIS — I10 Essential (primary) hypertension: Secondary | ICD-10-CM | POA: Insufficient documentation

## 2015-04-28 DIAGNOSIS — N401 Enlarged prostate with lower urinary tract symptoms: Secondary | ICD-10-CM

## 2015-04-28 DIAGNOSIS — M1990 Unspecified osteoarthritis, unspecified site: Secondary | ICD-10-CM | POA: Insufficient documentation

## 2015-04-28 DIAGNOSIS — N138 Other obstructive and reflux uropathy: Secondary | ICD-10-CM

## 2015-04-28 NOTE — Progress Notes (Signed)
Radiation Oncology         [phone]) [phone] ________________________________  Initial outpatient Consultation  Name: Seth Mccall MRN: [id number]  Date: 04/28/2015   DOB: 1929/12/07  [id number], MD  Kathie Rhodes, MD   REFERRING PHYSICIAN: Kathie Rhodes, MD  DIAGNOSIS: 81 y.o. gentleman with stage T1c adenocarcinoma of the prostate with a Gleason's score of 3+4 and a PSA of 7.32    ICD-9-CM ICD-10-CM   1. BPH (benign prostatic hypertrophy) with urinary obstruction 600.01 N40.1    599.69    2. Malignant neoplasm of prostate ([place]) [facility] ILLNESS::Lenardo H Engelhard is a 81 y.o. gentleman followed by urology after TURP in 2014. He was noted to have an elevated PSA of 7.32 on 10/08/14. Accordingly, he was referred for evaluation in urology, and a digital rectal examination was performed on 02/10/15 revealing a 1+ prostate with no nodulary. The patient proceeded to transrectal ultrasound with 12 biopsies of the prostate on 03/24/15. The prostate volume measured 13.39 cc.  Out of 12 core biopsies, 5 were positive.  The maximum Gleason score was 3+4, and this was seen in left and right apex.  The patient reviewed the biopsy results with his urologist and he has kindly been referred today for discussion of potential radiation treatment options.   PREVIOUS RADIATION THERAPY: No  PAST MEDICAL HISTORY:  has a past medical history of Hypertension; Arthritis; Gout; Complication of anesthesia; and Prostate cancer ([place]).    PAST SURGICAL HISTORY: Past Surgical History  Procedure Laterality Date  . Back surgery  2005    fusion of C3, C4, and C5  . Cataract extraction Bilateral   . Transurethral resection of prostate  10-10-2012  . Green light laser turp (transurethral resection of prostate N/A 10/10/2012    Procedure: GREEN LIGHT LASER TURP (TRANSURETHRAL RESECTION OF PROSTATE;  Surgeon: Claybon Jabs, MD;  Location: [facility];  Service: Urology;  Laterality: N/A;   . Prostate biopsy      FAMILY HISTORY: family history is not on file.  SOCIAL HISTORY:  reports that he quit smoking about 46 years ago. His smoking use included Cigarettes. He has a 2 pack-year smoking history. He has never used smokeless tobacco. He reports that he drinks alcohol. He reports that he does not use illicit drugs.  ALLERGIES: Review of patient's allergies indicates no known allergies.  MEDICATIONS:  Current Outpatient Prescriptions  Medication Sig Dispense Refill  . colchicine 0.6 MG tablet Take 0.6 mg by mouth every other day.    . lisinopril (PRINIVIL,ZESTRIL) 5 MG tablet Take 5 mg by mouth every morning. Take with a 10 mg tablet to make 15 mg    . Multiple Vitamin (MULTIVITAMIN WITH MINERALS) TABS Take 1 tablet by mouth daily.     No current facility-administered medications for this encounter.    REVIEW OF SYSTEMS:  A 15 point review of systems is documented in the electronic medical record. This was obtained by the nursing staff. However, I reviewed this with the patient to discuss relevant findings and make appropriate changes.  Pertinent items are noted in HPI..  The patient completed an IPSS and IIEF questionnaire.  PHYSICAL EXAM: This patient is in no acute distress.  He is alert and oriented.   weight is 160 lb (72.576 kg). His oral temperature is 98.2 F (36.8 C). His blood pressure is 144/70 and his pulse is 64. His respiration is 12 and oxygen saturation is 98%.  He exhibits  no respiratory distress or labored breathing.  He appears neurologically intact.  His mood is pleasant.  His affect is appropriate.  Please note the digital rectal exam findings described above.  KPS = 100  100 - Normal; no complaints; no evidence of disease. 90   - Able to carry on normal activity; minor signs or symptoms of disease. 80   - Normal activity with effort; some signs or symptoms of disease. 29   - Cares for self; unable to carry on normal activity or to do active work. 60    - Requires occasional assistance, but is able to care for most of his personal needs. 50   - Requires considerable assistance and frequent medical care. 40   - Disabled; requires special care and assistance. 20   - Severely disabled; hospital admission is indicated although death not imminent. 85   - Very sick; hospital admission necessary; active supportive treatment necessary. 10   - Moribund; fatal processes progressing rapidly. 0     - Dead  Karnofsky DA, Abelmann [place], Craver LS and Burchenal [facility] [phone]) The use of the nitrogen mustards in the palliative treatment of carcinoma: with particular reference to bronchogenic carcinoma Cancer 1 634-56   LABORATORY DATA:  Lab Results  Component Value Date   WBC 4.7 10/06/2012   HGB 14.6 10/11/2012   HCT 43.0 10/11/2012   MCV 91.2 10/06/2012   PLT 174 10/06/2012   Lab Results  Component Value Date   NA 139 10/11/2012   K 4.8 10/11/2012   CL 104 10/11/2012   CO2 31 10/06/2012   No results found for: ALT, AST, GGT, ALKPHOS, BILITOT   RADIOGRAPHY: No results found.    IMPRESSION: This gentleman is a 81 y.o. 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  His T-Stage, Gleason's Score, and PSA put him into the intermediate risk group.  Accordingly he is eligible for a variety of potential treatment options including prostatectomy or external radiation. He is not ideally suited for prostate seed implant based on previous TURP which can leave a central defect in the gland prohibiting seed purchase. Additionally, the prostate volume measured at TRUS was measured at less than 15 cc, which was below the threshold for seed implant.  PLAN: Today I reviewed the findings and workup thus far.  We discussed the natural history of prostate cancer.  We reviewed the the implications of T-stage, Gleason's Score, and PSA on decision-making and outcomes in prostate cancer.  We discussed radiation  treatment in the management of prostate cancer with regard to the logistics and delivery of external beam radiation treatment as well as the logistics and delivery of prostate brachytherapy.  We compared and contrasted each of these approaches and also compared these against prostatectomy.  The patient expressed interest in external beam radiotherapy.  I filled out a patient counseling form for him with relevant treatment diagrams and we retained a copy for our records.   We talked about the possibility of not having treatment as an option. The patient felt strongly with proceeding with treatment.  The patient would like to proceed with prostate IMRT.  I will share my findings with Dr. Karsten Ro and move forward with scheduling placement of three gold fiducial markers into the prostate to proceed with IMRT in the near future.  I enjoyed meeting with him today, and will look forward to participating in the care of this very nice gentleman.   ------------------------------------------------  Sheral Apley Tammi Klippel, M.D.    This document serves as a record of services personally performed by Tyler Pita, MD. It was created on his behalf by Lendon Collar, a trained medical scribe. The creation of this record is based on the scribe's personal observations and the provider's statements to them. This document has been checked and approved by the attending provider.

## 2015-04-28 NOTE — Progress Notes (Addendum)
GU Location of Tumor / Histology: Adenocarcinoma of the Prostate  If Prostate Cancer, Gleason Score is (3+4) and PSA is (7.32)  Garan H Bordner presented months ago with signs/symptoms of: For approximately 3 years patient reports obstructive voiding symptoms consisting primarily of nocturia, slow urinary stream, daytime frequency and fairly significant urgency.   Biopsies of  (if applicable) revealed: [id number]    Past/Anticipated interventions by urology, if any: 10/10/2012, Claybon Jabs, MD- TURP (TRANSURETHRAL RESECTION OF PROSTATE   Past/Anticipated interventions by medical oncology, if any: no  Weight changes, if any: no   Bowel/Bladder complaints, if any: Bladder: Reports occasional weak urinary stream and need to "push down" at the end of his stream.  Denies changes in urinary color or odor.  Denies pain with urination.  Bowel  Reports occasional constipations, which he contributes to his diet.    Nausea/Vomiting, if any: no  Pain issues, if any:  no  SAFETY ISSUES:  Prior radiation? no  Pacemaker/ICD? no  Possible current pregnancy? no  Is the patient on methotrexate? no  Current Complaints / other details:  History of kidney stones, approximately 15-20 years ago.  Reports after having his neck surgery he noticed weakness to his left leg.  He reports after falling in April of 2016 he noticed increased difficulty dressing his lower half.

## 2015-04-28 NOTE — Addendum Note (Signed)
Encounter addended by: Jenene Slicker, RN on: 04/28/2015  4:51 PM<BR>     Documentation filed: Charges VN

## 2015-04-29 ENCOUNTER — Telehealth: Payer: Self-pay | Admitting: *Deleted

## 2015-04-29 NOTE — Telephone Encounter (Signed)
CALLED PATIENT TO INFORM OF GOLD SEED PLACEMENT FOR 05-05-15 - ARRIVAL TIME - 1:45 PM @ DR. OTTELIN'S OFFICE AND HIS SIM ON 05-20-15 - @ 9 AM @ DR. MANNING'S OFFICE, SPOKE WITH PATIENT AND HE IS AWARE OF THESE APPTS.

## 2015-05-20 ENCOUNTER — Ambulatory Visit
Admission: RE | Admit: 2015-05-20 | Discharge: 2015-05-20 | Disposition: A | Discharge status: Home / Self Care | Payer: PPO | Source: Ambulatory Visit | Attending: Radiation Oncology | Admitting: Radiation Oncology

## 2015-05-20 DIAGNOSIS — C61 Malignant neoplasm of prostate: Secondary | ICD-10-CM | POA: Diagnosis not present

## 2015-05-20 NOTE — Progress Notes (Signed)
  Radiation Oncology         [phone] ________________________________  Name: Seth Mccall MRN: [id number]  Date: 05/20/2015   DOB: April 02, 1930  SIMULATION AND TREATMENT PLANNING NOTE    ICD-9-CM ICD-10-CM   1. Malignant neoplasm of prostate ([place]) 185 C61     DIAGNOSIS:  82 y.o. 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  Identity was confirmed.  All relevant records and images related to the planned course of therapy were reviewed.  The patient freely provided informed written consent to proceed with treatment after reviewing the details related to the planned course of therapy. The consent form was witnessed and verified by the simulation staff.  Then, the patient was set-up in a stable reproducible supine position for radiation therapy.  A vacuum lock pillow device was custom fabricated to position his legs in a reproducible immobilized position.  Then, I performed a urethrogram under sterile conditions to identify the prostatic apex.  CT images were obtained.  Surface markings were placed.  The CT images were loaded into the planning software.  Then the prostate target and avoidance structures including the rectum, bladder, bowel and hips were contoured.  Treatment planning then occurred.  The radiation prescription was entered and confirmed.  A total of 1 complex treatment devices were fabricated. I have requested : Intensity Modulated Radiotherapy (IMRT) is medically necessary for this case for the following reason:  Rectal sparing.Marland Kitchen  PLAN:  The patient will receive 78 Gy in 40 fractions in 1.8 Gy fractions.  ________________________________  Sheral Apley Tammi Klippel, M.D.    This document serves as a record of services personally performed by Tyler Pita, MD. It was created on his behalf by Lendon Collar, a trained medical scribe. The  creation of this record is based on the scribe's personal observations and the provider's statements to them. This document has been checked and approved by the attending provider.

## 2015-05-25 DIAGNOSIS — M1990 Unspecified osteoarthritis, unspecified site: Secondary | ICD-10-CM | POA: Diagnosis not present

## 2015-05-25 DIAGNOSIS — C61 Malignant neoplasm of prostate: Secondary | ICD-10-CM | POA: Diagnosis not present

## 2015-05-25 DIAGNOSIS — M109 Gout, unspecified: Secondary | ICD-10-CM | POA: Diagnosis not present

## 2015-05-25 DIAGNOSIS — I10 Essential (primary) hypertension: Secondary | ICD-10-CM | POA: Diagnosis not present

## 2015-05-25 DIAGNOSIS — Z87891 Personal history of nicotine dependence: Secondary | ICD-10-CM | POA: Diagnosis not present

## 2015-05-25 DIAGNOSIS — Z79899 Other long term (current) drug therapy: Secondary | ICD-10-CM | POA: Diagnosis not present

## 2015-05-26 DIAGNOSIS — C61 Malignant neoplasm of prostate: Secondary | ICD-10-CM | POA: Diagnosis not present

## 2015-06-01 ENCOUNTER — Ambulatory Visit
Admission: RE | Admit: 2015-06-01 | Discharge: 2015-06-01 | Disposition: A | Discharge status: Home / Self Care | Payer: PPO | Source: Ambulatory Visit | Attending: Radiation Oncology | Admitting: Radiation Oncology

## 2015-06-01 DIAGNOSIS — C61 Malignant neoplasm of prostate: Secondary | ICD-10-CM | POA: Diagnosis not present

## 2015-06-02 ENCOUNTER — Ambulatory Visit
Admission: RE | Admit: 2015-06-02 | Discharge: 2015-06-02 | Disposition: A | Discharge status: Home / Self Care | Payer: PPO | Source: Ambulatory Visit | Attending: Radiation Oncology | Admitting: Radiation Oncology

## 2015-06-02 ENCOUNTER — Encounter: Payer: Self-pay | Admitting: Radiation Oncology

## 2015-06-02 VITALS — BP 127/63 | HR 83 | Resp 16 | Wt 160.1 lb

## 2015-06-02 DIAGNOSIS — C61 Malignant neoplasm of prostate: Secondary | ICD-10-CM

## 2015-06-02 NOTE — Progress Notes (Signed)
Weight and vitals stable. Denies pain. Denies fatigue. Ambulates with the aid of cane. Describes an intermittent urine stream. Denies dysuria or hematuria. Reports nocturia x 1. Reports urinary leakage at night. Denies diarrhea. Oriented patient to staff and routine of the clinic. Provided patient with RADIATION THERAPY AND YOU handbook then, reviewed pertinent information. Educated patient reference potential side effects and management such as fatigue, diarrhea and urinary/bladder changes. Answered all patient questions to the best of my ability. Provided patient with my business card and encouraged him to call with needs. Patient verbalized understanding of all reviewed.   BP 127/63 mmHg  Pulse 83  Resp 16  Wt 160 lb 1.6 oz (72.621 kg)  SpO2 100% Wt Readings from Last 3 Encounters:  06/02/15 160 lb 1.6 oz (72.621 kg)  04/28/15 160 lb (72.576 kg)  10/10/12 161 lb (73.029 kg)

## 2015-06-02 NOTE — Progress Notes (Signed)
  Radiation Oncology         [phone]   Name: Seth Mccall MRN: [id number]   Date: 06/02/2015  DOB: [birth year]   Weekly Radiation Therapy Management    ICD-9-CM ICD-10-CM   1. Malignant neoplasm of prostate ([facility]) 185 C61     Current Dose: 3.9 Gy  Planned Dose:  78 Gy  Narrative The patient presents for routine under treatment assessment.  Weight and vitals stable. Denies pain. Denies fatigue. Ambulates with the aid of cane. Describes an intermittent urine stream. Denies dysuria or hematuria. Reports nocturia x 1. Reports urinary leakage at night. Denies diarrhea. Oriented patient to staff and routine of the clinic. Provided patient with RADIATION THERAPY AND YOU handbook then, reviewed pertinent information. Educated patient reference potential side effects and management such as fatigue, diarrhea and urinary/bladder changes. Answered all patient questions to the best of my ability. Provided patient with my business card and encouraged him to call with needs. Patient verbalized understanding of all reviewed.   The patient is without complaint. Set-up films were reviewed. The chart was checked.  Physical Findings  weight is 160 lb 1.6 oz (72.621 kg). His blood pressure is 127/63 and his pulse is 83. His respiration is 16 and oxygen saturation is 100%. . Weight essentially stable.  No significant changes.  Impression The patient is tolerating radiation.  Plan Continue treatment as planned.         Sheral Apley Tammi Klippel, M.D.    This document serves as a record of services personally performed by Tyler Pita, MD. It was created on his behalf by Lendon Collar, a trained medical scribe. The creation of this record is based on the scribe's personal observations and the provider's statements to them. This document has been checked and approved by the attending provider.

## 2015-06-03 ENCOUNTER — Ambulatory Visit
Admission: RE | Admit: 2015-06-03 | Discharge: 2015-06-03 | Disposition: A | Discharge status: Home / Self Care | Payer: PPO | Source: Ambulatory Visit | Attending: Radiation Oncology | Admitting: Radiation Oncology

## 2015-06-03 DIAGNOSIS — C61 Malignant neoplasm of prostate: Secondary | ICD-10-CM | POA: Diagnosis not present

## 2015-06-06 ENCOUNTER — Ambulatory Visit
Admission: RE | Admit: 2015-06-06 | Discharge: 2015-06-06 | Disposition: A | Discharge status: Home / Self Care | Payer: PPO | Source: Ambulatory Visit | Attending: Radiation Oncology | Admitting: Radiation Oncology

## 2015-06-06 DIAGNOSIS — C61 Malignant neoplasm of prostate: Secondary | ICD-10-CM | POA: Diagnosis not present

## 2015-06-07 ENCOUNTER — Ambulatory Visit
Admission: RE | Admit: 2015-06-07 | Discharge: 2015-06-07 | Disposition: A | Discharge status: Home / Self Care | Payer: PPO | Source: Ambulatory Visit | Attending: Radiation Oncology | Admitting: Radiation Oncology

## 2015-06-07 DIAGNOSIS — C61 Malignant neoplasm of prostate: Secondary | ICD-10-CM | POA: Diagnosis not present

## 2015-06-08 ENCOUNTER — Ambulatory Visit
Admission: RE | Admit: 2015-06-08 | Discharge: 2015-06-08 | Disposition: A | Discharge status: Home / Self Care | Payer: PPO | Source: Ambulatory Visit | Attending: Radiation Oncology | Admitting: Radiation Oncology

## 2015-06-08 DIAGNOSIS — C61 Malignant neoplasm of prostate: Secondary | ICD-10-CM | POA: Diagnosis not present

## 2015-06-09 ENCOUNTER — Encounter: Payer: Self-pay | Admitting: Radiation Oncology

## 2015-06-09 ENCOUNTER — Ambulatory Visit
Admission: RE | Admit: 2015-06-09 | Discharge: 2015-06-09 | Disposition: A | Discharge status: Home / Self Care | Payer: PPO | Source: Ambulatory Visit | Attending: Radiation Oncology | Admitting: Radiation Oncology

## 2015-06-09 VITALS — BP 113/73 | HR 60 | Temp 97.8°F | Resp 20 | Wt 158.3 lb

## 2015-06-09 DIAGNOSIS — C61 Malignant neoplasm of prostate: Secondary | ICD-10-CM | POA: Diagnosis not present

## 2015-06-09 NOTE — Progress Notes (Signed)
Weekly rad txs prostate 7/40 completed, increased urgency & frequency, says he voids very little but goes every 1/2 hour during the day, no dysuria, no hematuria, no nocturia,gets up only 1 time at night , regular bowels, no pain,  11:07 AM BP 113/73 mmHg  Pulse 60  Temp(Src) 97.8 F (36.6 C) (Oral)  Resp 20  Wt 158 lb 4.8 oz (71.804 kg)  Wt Readings from Last 3 Encounters:  06/09/15 158 lb 4.8 oz (71.804 kg)  06/02/15 160 lb 1.6 oz (72.621 kg)  04/28/15 160 lb (72.576 kg)

## 2015-06-09 NOTE — Progress Notes (Signed)
  Radiation Oncology         [phone]   Name: Seth Mccall MRN: [id number]   Date: 06/09/2015  DOB: 23-Apr-1930   Weekly Radiation Therapy Management    ICD-9-CM ICD-10-CM   1. Malignant neoplasm of prostate ([facility]) 185 C61     Current Dose: 13.65 Gy  Planned Dose:  78 Gy  Narrative The patient presents for routine under treatment assessment.  Patient reports increased urgency & frequency. Reports that he voids very little but goes every 1/2 hour during the day. However, patient reports that he only gets up 1 time at night. Patient denies dysuria, hematuria, and nocturia. Reports regular bowels, no pain. The patient is without complaint.  Set-up films were reviewed. The chart was checked.  Physical Findings  weight is 158 lb 4.8 oz (71.804 kg). His oral temperature is 97.8 F (36.6 C). His blood pressure is 113/73 and his pulse is 60. His respiration is 20.  Weight essentially stable.  No significant changes.  Impression The patient is tolerating radiation.  Plan Continue treatment as planned.      Sheral Apley Tammi Klippel, M.D. This document serves as a record of services personally performed by Tyler Pita, MD. It was created on his behalf by Jenell Milliner, a trained medical scribe. The creation of this record is based on the scribe's personal observations and the provider's statements to them. This document has been checked and approved by the attending provider.

## 2015-06-10 ENCOUNTER — Ambulatory Visit
Admission: RE | Admit: 2015-06-10 | Discharge: 2015-06-10 | Disposition: A | Discharge status: Home / Self Care | Payer: PPO | Source: Ambulatory Visit | Attending: Radiation Oncology | Admitting: Radiation Oncology

## 2015-06-10 DIAGNOSIS — C61 Malignant neoplasm of prostate: Secondary | ICD-10-CM | POA: Diagnosis not present

## 2015-06-13 ENCOUNTER — Ambulatory Visit: Payer: PPO

## 2015-06-13 ENCOUNTER — Ambulatory Visit
Admission: RE | Admit: 2015-06-13 | Discharge: 2015-06-13 | Disposition: A | Discharge status: Home / Self Care | Payer: PPO | Source: Ambulatory Visit | Attending: Radiation Oncology | Admitting: Radiation Oncology

## 2015-06-14 ENCOUNTER — Ambulatory Visit
Admission: RE | Admit: 2015-06-14 | Discharge: 2015-06-14 | Disposition: A | Discharge status: Home / Self Care | Payer: PPO | Source: Ambulatory Visit | Attending: Radiation Oncology | Admitting: Radiation Oncology

## 2015-06-14 DIAGNOSIS — C61 Malignant neoplasm of prostate: Secondary | ICD-10-CM | POA: Diagnosis not present

## 2015-06-15 ENCOUNTER — Ambulatory Visit
Admission: RE | Admit: 2015-06-15 | Discharge: 2015-06-15 | Disposition: A | Discharge status: Home / Self Care | Payer: PPO | Source: Ambulatory Visit | Attending: Radiation Oncology | Admitting: Radiation Oncology

## 2015-06-15 DIAGNOSIS — C61 Malignant neoplasm of prostate: Secondary | ICD-10-CM | POA: Diagnosis not present

## 2015-06-16 ENCOUNTER — Ambulatory Visit
Admission: RE | Admit: 2015-06-16 | Discharge: 2015-06-16 | Disposition: A | Discharge status: Home / Self Care | Payer: PPO | Source: Ambulatory Visit | Attending: Radiation Oncology | Admitting: Radiation Oncology

## 2015-06-16 VITALS — BP 105/67 | HR 71 | Resp 16 | Wt 159.8 lb

## 2015-06-16 DIAGNOSIS — C61 Malignant neoplasm of prostate: Secondary | ICD-10-CM | POA: Diagnosis not present

## 2015-06-16 NOTE — Progress Notes (Signed)
Weight and vitals stable. Denies pain. Reports urgency. Reports hesitancy and intermittent stream. Reports mild dysuria. Reports frequency continues but, is less than last week. Denies hematuria. Denies diarrhea or rectal irritation. Reports nocturia x 1. Denis fatigue.   BP 105/67 mmHg  Pulse 71  Resp 16  Wt 159 lb 12.8 oz (72.485 kg)  SpO2 100% Wt Readings from Last 3 Encounters:  06/16/15 159 lb 12.8 oz (72.485 kg)  06/09/15 158 lb 4.8 oz (71.804 kg)  06/02/15 160 lb 1.6 oz (72.621 kg)

## 2015-06-16 NOTE — Progress Notes (Signed)
  Radiation Oncology         [phone]   Name: Seth Mccall MRN: [id number]   Date: 06/16/2015  DOB: 12/17/1929   Weekly Radiation Therapy Management    ICD-9-CM ICD-10-CM   1. Malignant neoplasm of prostate ([facility]) 185 C61     Current Dose: 21.45 Gy  Planned Dose:  78 Gy  Narrative The patient presents for routine under treatment assessment.  Weight and vitals stable. Reports urinary urgency, hesitancy, and intermittent stream. Reports nocturia x1 and mild dysuria. Reports that frequency continues but, is less than last week. Denies pain, fatigue, hematuria, diarrhea, and rectal irritation.  Set-up films were reviewed. The chart was checked.  Physical Findings  weight is 159 lb 12.8 oz (72.485 kg). His blood pressure is 105/67 and his pulse is 71. His respiration is 16 and oxygen saturation is 100%.  Weight essentially stable.  No significant changes.  Impression The patient is tolerating radiation.  Plan Continue treatment as planned.      Sheral Apley Tammi Klippel, M.D. This document serves as a record of services personally performed by Tyler Pita, MD. It was created on his behalf by Jenell Milliner, a trained medical scribe. The creation of this record is based on the scribe's personal observations and the provider's statements to them. This document has been checked and approved by the attending provider.

## 2015-06-17 ENCOUNTER — Ambulatory Visit
Admission: RE | Admit: 2015-06-17 | Discharge: 2015-06-17 | Disposition: A | Discharge status: Home / Self Care | Payer: PPO | Source: Ambulatory Visit | Attending: Radiation Oncology | Admitting: Radiation Oncology

## 2015-06-17 DIAGNOSIS — C61 Malignant neoplasm of prostate: Secondary | ICD-10-CM | POA: Diagnosis not present

## 2015-06-20 ENCOUNTER — Ambulatory Visit
Admission: RE | Admit: 2015-06-20 | Discharge: 2015-06-20 | Disposition: A | Discharge status: Home / Self Care | Payer: PPO | Source: Ambulatory Visit | Attending: Radiation Oncology | Admitting: Radiation Oncology

## 2015-06-20 DIAGNOSIS — C61 Malignant neoplasm of prostate: Secondary | ICD-10-CM | POA: Diagnosis not present

## 2015-06-21 ENCOUNTER — Ambulatory Visit
Admission: RE | Admit: 2015-06-21 | Discharge: 2015-06-21 | Disposition: A | Discharge status: Home / Self Care | Payer: PPO | Source: Ambulatory Visit | Attending: Radiation Oncology | Admitting: Radiation Oncology

## 2015-06-21 DIAGNOSIS — C61 Malignant neoplasm of prostate: Secondary | ICD-10-CM | POA: Diagnosis not present

## 2015-06-22 ENCOUNTER — Ambulatory Visit
Admission: RE | Admit: 2015-06-22 | Discharge: 2015-06-22 | Disposition: A | Discharge status: Home / Self Care | Payer: PPO | Source: Ambulatory Visit | Attending: Radiation Oncology | Admitting: Radiation Oncology

## 2015-06-22 DIAGNOSIS — C61 Malignant neoplasm of prostate: Secondary | ICD-10-CM | POA: Diagnosis not present

## 2015-06-23 ENCOUNTER — Ambulatory Visit
Admission: RE | Admit: 2015-06-23 | Discharge: 2015-06-23 | Disposition: A | Discharge status: Home / Self Care | Payer: PPO | Source: Ambulatory Visit | Attending: Radiation Oncology | Admitting: Radiation Oncology

## 2015-06-23 DIAGNOSIS — C61 Malignant neoplasm of prostate: Secondary | ICD-10-CM | POA: Diagnosis not present

## 2015-06-24 ENCOUNTER — Ambulatory Visit
Admission: RE | Admit: 2015-06-24 | Discharge: 2015-06-24 | Disposition: A | Discharge status: Home / Self Care | Payer: PPO | Source: Ambulatory Visit | Attending: Radiation Oncology | Admitting: Radiation Oncology

## 2015-06-24 VITALS — BP 129/69 | HR 62 | Resp 16 | Wt 159.3 lb

## 2015-06-24 DIAGNOSIS — C61 Malignant neoplasm of prostate: Secondary | ICD-10-CM | POA: Diagnosis not present

## 2015-06-24 NOTE — Patient Instructions (Signed)
Contact our office if you have any questions following today's appointment: [phone].  

## 2015-06-24 NOTE — Progress Notes (Signed)
  Radiation Oncology         [phone]   Name: Seth Mccall MRN: [id number]   Date: 06/24/2015  DOB: [birth year]     Weekly Radiation Therapy Management    ICD-9-CM ICD-10-CM   1. Malignant neoplasm of prostate ([facility]) 185 C61     Current Dose: 33.15 Gy  Planned Dose:  78 Gy  Narrative The patient presents for routine under treatment assessment.  On review of systems, he reports dysuria and difficulty beginning urinary stream.  He denies any difficulty with hematuria , frequency or incontinence. No other complaints or verbalized  Set-up films were reviewed. The chart was checked.  Physical Findings  weight is 159 lb 4.8 oz (72.258 kg). His blood pressure is 129/69 and his pulse is 62. His respiration is 16 and oxygen saturation is 100%.    Pain scale 0 out of 10  in general this is a well-appearing [place] Asian male in no acute distress. He is alert and oriented 4 and appropriate throughout the examination. Cardiopulmonary assessment is negative for acute distress and he exhibits normal effort.  Impression The patient is tolerating radiation.  Plan Continue treatment as planned. We will continue to follow his symptoms.   The above documentation reflects my direct findings during this shared patient visit. Please see the separate note by Dr. Tammi Klippel on this date for the remainder of the patient's plan of care.  Carola Rhine, PAC   This document serves as a record of services personally performed by Shona Simpson, PAC and Tyler Pita, MD. It was created on their behalf by Arlyce Harman, a trained medical scribe. The creation of this record is based on the scribe's personal observations and the provider's statements to them. This document has been checked and approved by the attending provider.

## 2015-06-27 ENCOUNTER — Ambulatory Visit
Admission: RE | Admit: 2015-06-27 | Discharge: 2015-06-27 | Disposition: A | Discharge status: Home / Self Care | Payer: PPO | Source: Ambulatory Visit | Attending: Radiation Oncology | Admitting: Radiation Oncology

## 2015-06-27 DIAGNOSIS — C61 Malignant neoplasm of prostate: Secondary | ICD-10-CM | POA: Diagnosis not present

## 2015-06-28 ENCOUNTER — Ambulatory Visit
Admission: RE | Admit: 2015-06-28 | Discharge: 2015-06-28 | Disposition: A | Discharge status: Home / Self Care | Payer: PPO | Source: Ambulatory Visit | Attending: Radiation Oncology | Admitting: Radiation Oncology

## 2015-06-28 DIAGNOSIS — C61 Malignant neoplasm of prostate: Secondary | ICD-10-CM | POA: Diagnosis not present

## 2015-06-29 ENCOUNTER — Ambulatory Visit
Admission: RE | Admit: 2015-06-29 | Discharge: 2015-06-29 | Disposition: A | Discharge status: Home / Self Care | Payer: PPO | Source: Ambulatory Visit | Attending: Radiation Oncology | Admitting: Radiation Oncology

## 2015-06-29 DIAGNOSIS — C61 Malignant neoplasm of prostate: Secondary | ICD-10-CM | POA: Diagnosis not present

## 2015-06-30 ENCOUNTER — Ambulatory Visit
Admission: RE | Admit: 2015-06-30 | Discharge: 2015-06-30 | Disposition: A | Discharge status: Home / Self Care | Payer: PPO | Source: Ambulatory Visit | Attending: Radiation Oncology | Admitting: Radiation Oncology

## 2015-06-30 VITALS — BP 123/65 | HR 70 | Resp 16 | Wt 160.0 lb

## 2015-06-30 DIAGNOSIS — C61 Malignant neoplasm of prostate: Secondary | ICD-10-CM

## 2015-06-30 NOTE — Progress Notes (Signed)
Weight and vitals stable. Denies pain. Reports urgency. Reports hesitancy and intermittent stream. Reports mild dysuria. Reports urinary frequency. Denies hematuria. Denies diarrhea or rectal irritation. Reports some constipation. Discuss Miralax and probiotics. Reports nocturia x 1. Denies fatigue.   BP 123/65 mmHg  Pulse 70  Resp 16  Wt 160 lb (72.576 kg)  SpO2 100% Wt Readings from Last 3 Encounters:  06/30/15 160 lb (72.576 kg)  06/24/15 159 lb 4.8 oz (72.258 kg)  06/16/15 159 lb 12.8 oz (72.485 kg)

## 2015-06-30 NOTE — Progress Notes (Signed)
   Radiation Oncology         [phone]   Name: Seth Mccall MRN: [id number]   Date: 06/30/2015  DOB: 07-14-1929   Weekly Radiation Therapy Management    ICD-9-CM ICD-10-CM   1. Malignant neoplasm of prostate ([facility]) 185 C61     Current Dose: 40.95 Gy  Planned Dose:  78 Gy  Narrative The patient presents for routine under treatment assessment.  Weight and vitals stable. Denies pain. Reports urgency. Reports hesitancy and intermittent stream. Reports mild dysuria. Reports urinary frequency. Denies hematuria. Denies diarrhea or rectal irritation. Reports some constipation. Discuss Miralax and probiotics. Reports nocturia x 1. Denies fatigue.  Set-up films were reviewed. The chart was checked.  Physical Findings  weight is 160 lb (72.576 kg). His blood pressure is 123/65 and his pulse is 70. His respiration is 16 and oxygen saturation is 100%.  Weight essentially stable.  No significant changes.  Impression The patient is tolerating radiation.  Plan Continue treatment as planned.      Sheral Apley Tammi Klippel, M.D. This document serves as a record of services personally performed by Tyler Pita, MD. It was created on his behalf by Derek Mound, a trained medical scribe. The creation of this record is based on the scribe's personal observations and the provider's statements to them. This document has been checked and approved by the attending provider.

## 2015-07-01 ENCOUNTER — Ambulatory Visit
Admission: RE | Admit: 2015-07-01 | Discharge: 2015-07-01 | Disposition: A | Discharge status: Home / Self Care | Payer: PPO | Source: Ambulatory Visit | Attending: Radiation Oncology | Admitting: Radiation Oncology

## 2015-07-01 DIAGNOSIS — C61 Malignant neoplasm of prostate: Secondary | ICD-10-CM | POA: Diagnosis not present

## 2015-07-04 ENCOUNTER — Ambulatory Visit
Admission: RE | Admit: 2015-07-04 | Discharge: 2015-07-04 | Disposition: A | Discharge status: Home / Self Care | Payer: PPO | Source: Ambulatory Visit | Attending: Radiation Oncology | Admitting: Radiation Oncology

## 2015-07-04 DIAGNOSIS — C61 Malignant neoplasm of prostate: Secondary | ICD-10-CM | POA: Diagnosis not present

## 2015-07-05 ENCOUNTER — Ambulatory Visit
Admission: RE | Admit: 2015-07-05 | Discharge: 2015-07-05 | Disposition: A | Discharge status: Home / Self Care | Payer: PPO | Source: Ambulatory Visit | Attending: Radiation Oncology | Admitting: Radiation Oncology

## 2015-07-05 DIAGNOSIS — C61 Malignant neoplasm of prostate: Secondary | ICD-10-CM | POA: Diagnosis not present

## 2015-07-06 ENCOUNTER — Ambulatory Visit
Admission: RE | Admit: 2015-07-06 | Discharge: 2015-07-06 | Disposition: A | Discharge status: Home / Self Care | Payer: PPO | Source: Ambulatory Visit | Attending: Radiation Oncology | Admitting: Radiation Oncology

## 2015-07-06 DIAGNOSIS — C61 Malignant neoplasm of prostate: Secondary | ICD-10-CM | POA: Diagnosis not present

## 2015-07-07 ENCOUNTER — Ambulatory Visit
Admission: RE | Admit: 2015-07-07 | Discharge: 2015-07-07 | Disposition: A | Discharge status: Home / Self Care | Payer: PPO | Source: Ambulatory Visit | Attending: Radiation Oncology | Admitting: Radiation Oncology

## 2015-07-07 DIAGNOSIS — C61 Malignant neoplasm of prostate: Secondary | ICD-10-CM | POA: Diagnosis not present

## 2015-07-08 ENCOUNTER — Ambulatory Visit
Admission: RE | Admit: 2015-07-08 | Discharge: 2015-07-08 | Disposition: A | Discharge status: Home / Self Care | Payer: PPO | Source: Ambulatory Visit | Attending: Radiation Oncology | Admitting: Radiation Oncology

## 2015-07-08 ENCOUNTER — Encounter: Payer: Self-pay | Admitting: Radiation Oncology

## 2015-07-08 VITALS — BP 111/68 | HR 84 | Resp 16 | Wt 158.5 lb

## 2015-07-08 DIAGNOSIS — C61 Malignant neoplasm of prostate: Secondary | ICD-10-CM | POA: Diagnosis not present

## 2015-07-08 NOTE — Progress Notes (Signed)
Weight and vitals stable. Denies pain. Reports urgency continues. Reports hesitancy with intermittent stream. Reports mild dysuria. Reports urinary frequency. Denies hematuria. Denies diarrhea or rectal irritation. Reports some constipation. Reports nocturia x1. Reports occasional bladder leakage during the night time hours only. Reports mild fatigue.   BP 111/68 mmHg  Pulse 84  Resp 16  Wt 158 lb 8 oz (71.895 kg)  SpO2 100% Wt Readings from Last 3 Encounters:  07/08/15 158 lb 8 oz (71.895 kg)  06/30/15 160 lb (72.576 kg)  06/24/15 159 lb 4.8 oz (72.258 kg)

## 2015-07-08 NOTE — Patient Instructions (Signed)
Contact our office if you have any questions following today's appointment: [phone].  

## 2015-07-08 NOTE — Progress Notes (Signed)
   Radiation Oncology         [phone]   Name: Seth Mccall MRN: [id number]   Date: 07/08/2015  DOB: Apr 29, 1930   Weekly Radiation Therapy Management    ICD-9-CM ICD-10-CM   1. Malignant neoplasm of prostate ([facility]) 185 C61     Current Dose: 52.35 Gy  Planned Dose:  78 Gy  Narrative The patient presents for routine under treatment assessment.  On review of systems, the patient continues to have episodes of nocturia 1-2 times per night. He states that in the last two nights, he has noticed dribbling and a small amount of incontinence he had not previously noticed. He has some dysuria but denies any worsening of this, more frequency, flank pain, or fever. He denies any hematuria. No other complaints are verbalized.  Set-up films were reviewed. The chart was checked.  Physical Findings  weight is 158 lb 8 oz (71.895 kg). His blood pressure is 111/68 and his pulse is 84. His respiration is 16 and oxygen saturation is 100%.   Pain scale 0/10 In general this is a well appearing elderly male in no acute distress. He's alert and oriented x4 and appropriate throughout the examination. Cardiopulmonary assessment reveals normal effort.  Impression The patient is tolerating radiation for his intermediate risk T1C adenocarcinoma of the prostate.  Plan Continue treatment as planned. He will notify us of increasing urinary symptoms, but also cut back on his oral intake of liquids prior to bedtime.     Jodelle Gross, MD, PhD

## 2015-07-11 ENCOUNTER — Ambulatory Visit
Admission: RE | Admit: 2015-07-11 | Discharge: 2015-07-11 | Disposition: A | Discharge status: Home / Self Care | Payer: PPO | Source: Ambulatory Visit | Attending: Radiation Oncology | Admitting: Radiation Oncology

## 2015-07-12 ENCOUNTER — Ambulatory Visit
Admission: RE | Admit: 2015-07-12 | Discharge: 2015-07-12 | Disposition: A | Discharge status: Home / Self Care | Payer: PPO | Source: Ambulatory Visit | Attending: Radiation Oncology | Admitting: Radiation Oncology

## 2015-07-12 DIAGNOSIS — C61 Malignant neoplasm of prostate: Secondary | ICD-10-CM | POA: Diagnosis not present

## 2015-07-13 ENCOUNTER — Ambulatory Visit
Admission: RE | Admit: 2015-07-13 | Discharge: 2015-07-13 | Disposition: A | Discharge status: Home / Self Care | Payer: PPO | Source: Ambulatory Visit | Attending: Radiation Oncology | Admitting: Radiation Oncology

## 2015-07-13 DIAGNOSIS — C61 Malignant neoplasm of prostate: Secondary | ICD-10-CM | POA: Diagnosis not present

## 2015-07-14 ENCOUNTER — Ambulatory Visit
Admission: RE | Admit: 2015-07-14 | Discharge: 2015-07-14 | Disposition: A | Discharge status: Home / Self Care | Payer: PPO | Source: Ambulatory Visit | Attending: Radiation Oncology | Admitting: Radiation Oncology

## 2015-07-14 DIAGNOSIS — C61 Malignant neoplasm of prostate: Secondary | ICD-10-CM | POA: Diagnosis not present

## 2015-07-15 ENCOUNTER — Ambulatory Visit
Admission: RE | Admit: 2015-07-15 | Discharge: 2015-07-15 | Disposition: A | Discharge status: Home / Self Care | Payer: PPO | Source: Ambulatory Visit | Attending: Radiation Oncology | Admitting: Radiation Oncology

## 2015-07-15 VITALS — BP 128/69 | HR 74 | Resp 16 | Wt 157.9 lb

## 2015-07-15 DIAGNOSIS — C61 Malignant neoplasm of prostate: Secondary | ICD-10-CM

## 2015-07-15 NOTE — Progress Notes (Signed)
Weight and vitals stable. Denies pain. Reports urgency continues. Reports hesitancy with intermittent stream. Reports mild dysuria. Reports urinary frequency. Denies hematuria. Denies diarrhea or rectal irritation.  Reports nocturia x1. Reports occasional bladder leakage during the night time hours only. Reports mild fatigue.  BP 128/69 mmHg  Pulse 74  Resp 16  Wt 157 lb 14.4 oz (71.623 kg) Wt Readings from Last 3 Encounters:  07/15/15 157 lb 14.4 oz (71.623 kg)  07/08/15 158 lb 8 oz (71.895 kg)  06/30/15 160 lb (72.576 kg)

## 2015-07-15 NOTE — Progress Notes (Signed)
   Radiation Oncology         [phone]   Name: Seth Mccall MRN: [id number]   Date: 07/15/2015  DOB: 11/22/1929     Weekly Radiation Therapy Management  No diagnosis found.  Current Dose: 60.45 Gy  Planned Dose:  78 Gy  Narrative The patient presents for routine under treatment assessment.  Weight and vitals stable. Weekly radiation treatment, 31/40 fractions. Denies pain. Reports urgency continues. Reports hesitancy with intermittent stream. Reports mild dysuria at beginning of urinary stream, attributes this to not drinking enough water. Reports urinary frequency. Denies hematuria. Denies diarrhea or rectal irritation.Reports nocturia x1. Reports occasional bladder leakage during the night time hours only. Reports mild fatigue. He is accompanied by his daughter today.  Set-up films were reviewed. The chart was checked.  Physical Findings  weight is 157 lb 14.4 oz (71.623 kg). His blood pressure is 128/69 and his pulse is 74. His respiration is 16.  Weight essentially stable.  No significant changes. Ambulates with use of cane.    Impression The patient is tolerating radiation.  Plan Continue treatment as planned.      ------------------------------------------------  Jodelle Gross, MD, PhD   This document serves as a record of services personally performed by Kyung Rudd, MD. It was created on his behalf by Arlyce Harman, a trained medical scribe. The creation of this record is based on the scribe's personal observations and the provider's statements to them. This document has been checked and approved by the attending provider.

## 2015-07-18 ENCOUNTER — Ambulatory Visit
Admission: RE | Admit: 2015-07-18 | Discharge: 2015-07-18 | Disposition: A | Discharge status: Home / Self Care | Payer: PPO | Source: Ambulatory Visit | Attending: Radiation Oncology | Admitting: Radiation Oncology

## 2015-07-18 DIAGNOSIS — C61 Malignant neoplasm of prostate: Secondary | ICD-10-CM | POA: Diagnosis not present

## 2015-07-19 ENCOUNTER — Ambulatory Visit
Admission: RE | Admit: 2015-07-19 | Discharge: 2015-07-19 | Disposition: A | Discharge status: Home / Self Care | Payer: PPO | Source: Ambulatory Visit | Attending: Radiation Oncology | Admitting: Radiation Oncology

## 2015-07-19 DIAGNOSIS — C61 Malignant neoplasm of prostate: Secondary | ICD-10-CM | POA: Diagnosis not present

## 2015-07-20 ENCOUNTER — Ambulatory Visit
Admission: RE | Admit: 2015-07-20 | Discharge: 2015-07-20 | Disposition: A | Discharge status: Home / Self Care | Payer: PPO | Source: Ambulatory Visit | Attending: Radiation Oncology | Admitting: Radiation Oncology

## 2015-07-20 DIAGNOSIS — C61 Malignant neoplasm of prostate: Secondary | ICD-10-CM | POA: Diagnosis not present

## 2015-07-21 ENCOUNTER — Ambulatory Visit
Admission: RE | Admit: 2015-07-21 | Discharge: 2015-07-21 | Disposition: A | Discharge status: Home / Self Care | Payer: PPO | Source: Ambulatory Visit | Attending: Radiation Oncology | Admitting: Radiation Oncology

## 2015-07-21 VITALS — BP 120/81 | HR 74 | Resp 16 | Wt 158.3 lb

## 2015-07-21 DIAGNOSIS — C61 Malignant neoplasm of prostate: Secondary | ICD-10-CM | POA: Diagnosis not present

## 2015-07-21 NOTE — Progress Notes (Signed)
   Radiation Oncology         [phone]   Name: Seth Mccall MRN: [id number]   Date: 07/21/2015  DOB: [birth year]    Weekly Radiation Therapy Management    ICD-9-CM ICD-10-CM   1. Malignant neoplasm of prostate ([facility]) 185 C61     Current Dose: 68.25 Gy  Planned Dose:  78 Gy  Narrative The patient presents for routine under treatment assessment.  Weight and vitals stable. Denies pain. Reports urinary urgency continues. Reports hesitancy with intermittent stream. Reports moderate dysuria. Reports urinary frequency. Denies hematuria. Denies diarrhea or rectal irritation. Reports nocturia x1. Reports occasional bladder leakage during the night time hours only. Reports mild fatigue.  Set-up films were reviewed. The chart was checked.  Physical Findings  weight is 158 lb 4.8 oz (71.804 kg). His blood pressure is 120/81 and his pulse is 74. His respiration is 16 and oxygen saturation is 100%.  Weight essentially stable.  No significant changes. The patient's skin in the inguinal folds shows mild hyperpigmentation.  Impression The patient is tolerating radiation. Possible candidiasis.  Plan Continue treatment as planned. We gave the patient nystatin power to apply to the inguinal folds.      Sheral Apley Tammi Klippel, M.D.  This document serves as a record of services personally performed by Tyler Pita, MD. It was created on his behalf by Darcus Austin, a trained medical scribe. The creation of this record is based on the scribe's personal observations and the provider's statements to them. This document has been checked and approved by the attending provider.

## 2015-07-21 NOTE — Progress Notes (Signed)
Weight and vitals stable. Denies pain. Reports urinary urgency continues. Reports hesitancy with intermittent stream. Reports moderate dysuria. Reports urinary frequency. Denies hematuria. Denies diarrhea or rectal irritation. Reports nocturia x1. Reports occasional bladder leakage during the night time hours only. Reports mild fatigue.  BP 120/81 mmHg  Pulse 74  Resp 16  Wt 158 lb 4.8 oz (71.804 kg)  SpO2 100% Wt Readings from Last 3 Encounters:  07/21/15 158 lb 4.8 oz (71.804 kg)  07/15/15 157 lb 14.4 oz (71.623 kg)  07/08/15 158 lb 8 oz (71.895 kg)

## 2015-07-22 ENCOUNTER — Ambulatory Visit
Admission: RE | Admit: 2015-07-22 | Discharge: 2015-07-22 | Disposition: A | Discharge status: Home / Self Care | Payer: PPO | Source: Ambulatory Visit | Attending: Radiation Oncology | Admitting: Radiation Oncology

## 2015-07-22 DIAGNOSIS — C61 Malignant neoplasm of prostate: Secondary | ICD-10-CM | POA: Diagnosis not present

## 2015-07-25 ENCOUNTER — Ambulatory Visit
Admission: RE | Admit: 2015-07-25 | Discharge: 2015-07-25 | Disposition: A | Discharge status: Home / Self Care | Payer: PPO | Source: Ambulatory Visit | Attending: Radiation Oncology | Admitting: Radiation Oncology

## 2015-07-25 DIAGNOSIS — C61 Malignant neoplasm of prostate: Secondary | ICD-10-CM | POA: Diagnosis not present

## 2015-07-26 ENCOUNTER — Ambulatory Visit: Payer: PPO

## 2015-07-26 ENCOUNTER — Ambulatory Visit
Admission: RE | Admit: 2015-07-26 | Discharge: 2015-07-26 | Disposition: A | Discharge status: Home / Self Care | Payer: PPO | Source: Ambulatory Visit | Attending: Radiation Oncology | Admitting: Radiation Oncology

## 2015-07-26 DIAGNOSIS — C61 Malignant neoplasm of prostate: Secondary | ICD-10-CM | POA: Diagnosis not present

## 2015-07-27 ENCOUNTER — Ambulatory Visit
Admission: RE | Admit: 2015-07-27 | Discharge: 2015-07-27 | Disposition: A | Discharge status: Home / Self Care | Payer: PPO | Source: Ambulatory Visit | Attending: Radiation Oncology | Admitting: Radiation Oncology

## 2015-07-27 ENCOUNTER — Ambulatory Visit: Payer: PPO

## 2015-07-28 ENCOUNTER — Ambulatory Visit
Admission: RE | Admit: 2015-07-28 | Discharge: 2015-07-28 | Disposition: A | Discharge status: Home / Self Care | Payer: PPO | Source: Ambulatory Visit | Attending: Radiation Oncology | Admitting: Radiation Oncology

## 2015-07-28 ENCOUNTER — Encounter: Payer: Self-pay | Admitting: Radiation Oncology

## 2015-07-28 VITALS — BP 123/62 | HR 75 | Resp 16 | Wt 159.7 lb

## 2015-07-28 DIAGNOSIS — C61 Malignant neoplasm of prostate: Secondary | ICD-10-CM | POA: Insufficient documentation

## 2015-07-28 DIAGNOSIS — Z510 Encounter for antineoplastic radiation therapy: Secondary | ICD-10-CM | POA: Insufficient documentation

## 2015-07-28 NOTE — Progress Notes (Addendum)
Weight and vitals stable. Denies pain. Reports urinary urgency continues. Reports hesitancy with intermittent stream. Reports moderate dysuria. Reports urinary frequency. Denies hematuria. Denies diarrhea or rectal irritation. Reports when he urinate often he passes a scant amount of stool at the same time. Reports nocturia x1. Reports occasional bladder leakage during the night time hours only. Reports mild fatigue. One month follow up appointment card given. Understands to contact this RN with future needs.   BP 123/62 mmHg  Pulse 75  Resp 16  Wt 159 lb 11.2 oz (72.439 kg)  SpO2 100% Wt Readings from Last 3 Encounters:  07/28/15 159 lb 11.2 oz (72.439 kg)  07/21/15 158 lb 4.8 oz (71.804 kg)  07/15/15 157 lb 14.4 oz (71.623 kg)

## 2015-07-28 NOTE — Progress Notes (Signed)
   Radiation Oncology         [phone]   Name: Seth Mccall MRN: [id number]   Date: 07/28/2015  DOB: [birth year]    Weekly Radiation Therapy Management    ICD-9-CM ICD-10-CM   1. Malignant neoplasm of prostate (HCC) 185 C61     Current Dose: 78 Gy  Planned Dose:  78 Gy  Narrative The patient presents for routine under treatment assessment.  Weight and vitals stable. Denies pain. Reports urinary urgency continues. Reports hesitancy with intermittent stream. Reports moderate dysuria. Reports urinary frequency. Denies hematuria. Denies diarrhea or rectal irritation. Reports when he urinate often he passes a scant amount of stool at the same time. Reports nocturia x1. Reports occasional bladder leakage during the night time hours only. Reports mild fatigue. One month follow up appointment card given. Understands to contact this RN with future needs.   Set-up films were reviewed. The chart was checked.  Physical Findings  weight is 159 lb 11.2 oz (72.439 kg). His blood pressure is 123/62 and his pulse is 75. His respiration is 16 and oxygen saturation is 100%.  Weight essentially stable.  No significant changes.   Impression The patient is tolerating radiation.  Plan Continue treatment as planned. The patient was given a one month follow up appointment card today.      Sheral Apley Tammi Klippel, M.D.  This document serves as a record of services personally performed by Tyler Pita, MD. It was created on his behalf by Arlyce Harman, a trained medical scribe. The creation of this record is based on the scribe's personal observations and the provider's statements to them. This document has been checked and approved by the attending provider.

## 2015-08-19 NOTE — Progress Notes (Signed)
  Radiation Oncology         [phone] ________________________________  Name: Seth Mccall MRN: [id number]  Date: 07/28/2015  DOB: Nov 12, 1929  End of Treatment Note   ICD-9-CM ICD-10-CM    1. Malignant neoplasm of prostate ([place]) 185 C61     DIAGNOSIS: 81 y.o. gentleman with stage T1c adenocarcinoma of the prostate with a Gleason's score of 3+4 and a PSA of 7.32     Indication for treatment:  Curative, Definitive Radiotherapy       Radiation treatment dates:   06/01/2015-07/28/2015  Site/dose:   The prostate was treated to 78 Gy in 40 fractions of 1.95 Gy  Beams/energy:   The patient was treated with IMRT using volumetric arc therapy delivering 6 MV X-rays to clockwise and counterclockwise circumferential arcs with a 90 degree collimator offset to avoid dose scalloping.  Image guidance was performed with daily cone beam CT prior to each fraction to align to gold markers in the prostate and assure proper bladder and rectal fill volumes.  Immobilization was achieved with BodyFix custom mold.  Narrative: The patient tolerated radiation treatment relatively well.   The patient experienced some modest fatigue and minor urinary irritation including moderate dysuria, hesitancy with intermittent stream, urgency, frequency, and occasional bladder leakage during the night time hours only.  Plan: The patient has completed radiation treatment. He will return to radiation oncology clinic for routine followup in one month. I advised him to call or return sooner if he has any questions or concerns related to his recovery or treatment. ________________________________  Sheral Apley. Tammi Klippel, M.D.  This document serves as a record of services personally performed by Tyler Pita, MD. It was created on his behalf by Arlyce Harman, a trained medical scribe. The creation of this record is based on the scribe's personal observations and the provider's statements to them. This document  has been checked and approved by the attending provider.

## 2015-08-30 ENCOUNTER — Telehealth: Payer: Self-pay | Admitting: *Deleted

## 2015-08-30 ENCOUNTER — Encounter: Payer: Self-pay | Admitting: Radiation Oncology

## 2015-08-30 ENCOUNTER — Ambulatory Visit
Admission: RE | Admit: 2015-08-30 | Discharge: 2015-08-30 | Disposition: A | Discharge status: Home / Self Care | Payer: PPO | Source: Ambulatory Visit | Attending: Radiation Oncology | Admitting: Radiation Oncology

## 2015-08-30 VITALS — BP 126/75 | HR 81 | Temp 97.8°F | Ht 63.0 in | Wt 161.4 lb

## 2015-08-30 DIAGNOSIS — C61 Malignant neoplasm of prostate: Secondary | ICD-10-CM

## 2015-08-30 DIAGNOSIS — B379 Candidiasis, unspecified: Secondary | ICD-10-CM | POA: Insufficient documentation

## 2015-08-30 NOTE — Telephone Encounter (Signed)
CALLED PATIENT TO INFORM OF SURVIVORSHIP APPT. ON 09-29-15 WITH GRETCHEN DAWSON @ 11 AM, SPOKE WITH PATIENT AND HE IS AWARE OF THIS APPT.

## 2015-08-30 NOTE — Progress Notes (Signed)
Radiation Oncology         [phone] ________________________________  Name: Seth Mccall MRN: [id number]  Date: 08/30/2015  DOB: [birth year]  Follow-Up Visit Note  CC: [name], MD  Kathie Rhodes, MD  Diagnosis:   Stage T1c, adenocarcinoma of the prostate with Gleason's score 3+4 and PSA of 7.32.  Interval Since Last Radiation:   4 weeks  06/01/15-07/28/15: The prostate was treated to 78 gr in 40 fractions  Narrative:  The patient returns today for routine follow-up. During the course of treatment, he did very well in terms of tolerating the therapy, but did experience some fatigue, as well as dysuria and towards the end developed a episode of candida in the groin folds bilaterally. He was treated with nystatin.  On review of systems, the patient reports that he is doing much better since her last visit, is yeast has been fully treated, but he does have some irritation still in the groin folds. He states that he has not had any fevers or chills, chest pain or shortness of breath. He does have some residual urinary incontinence which was present prior to his radiotherapy treatments. He wears a pad, and changes this once a day. He does trying talcum powder to the skin to keep this dry.  He also has noticed some hesitancy on occasion, an incomplete sense of emptying. He states that once he is able to sit or stand long enough, he is able to completely empty his bladder. He denies any dysuria at this time. He has regular bowel activity and denies any fecal incontinence. No other complaints or verbalized.                            ALLERGIES:  has No Known Allergies.  Meds: Current Outpatient Prescriptions  Medication Sig Dispense Refill  . colchicine 0.6 MG tablet Take 0.6 mg by mouth every other day. Reported on 06/02/2015    . lisinopril (PRINIVIL,ZESTRIL) 5 MG tablet Take 5 mg by mouth every morning. Take with a 10 mg tablet to make 15 mg     No current facility-administered  medications for this encounter.    Physical Findings:  height is 5\' 3"  (1.6 m) and weight is 161 lb 6.4 oz (73.211 kg). His temperature is 97.8 F (36.6 C). His blood pressure is 126/75 and his pulse is 81. .   In general this is a well appearing [place] male in no acute distress. He's alert and oriented x4 and appropriate throughout the examination. Cardiopulmonary assessment is negative for acute distress and he exhibits normal effort. The groin. Bilaterally are examined, no evidence of erythematous streaking or plaquing of the skin is noted. No evidence of cutaneous candidiasis appreciated. He does have some hyperpigmentation with a previous candida was noted. No evidence of skin breakdown or superinfection is identified.   Lab Findings: Lab Results  Component Value Date   WBC 4.7 10/06/2012   HGB 14.6 10/11/2012   HCT 43.0 10/11/2012   MCV 91.2 10/06/2012   PLT 174 10/06/2012     Radiographic Findings: No results found.  Impression/Plan: 1. Stage T1c, adenocarcinoma of the prostate with Gleason's score 3+4 and PSA of 7.32. The patient has successfully completed radiotherapy did very well overall. His symptoms are improving, and the  residual symptoms that he is currently experiencing include hesitancy, incomplete sense of voiding at times, and nocturia 1. He is generally quite satisfied with how he has done.  We discussed the rationale for me back with urology in approximately 2 months time for his first PSA assessment. We did discuss the typical is for his nadir 2 PO2 around 1. He does understand that this will take time to see this and that we would anticipate the neighboring time between 18-24 months. He states agreement and understanding. He did not receive any androgen deprivation therapy, and we are hopeful that moving forward he will not require any additional treatment. We would be happy to see him back in the future should he have any questions or concerns regarding his  treatment or require additional radiation. 2. Survivor. I have given the patient a pamphlet on wife after cancer for every survivor, and described the movement and oncology towards survivor clinic and the importance that they hold. He is interested in meeting at least one time with our survivorship program, and a referral was placed to Mike Craze, NP to be seeing the patient in approximately one month's time. 3. Previously treated cutaneous candida. The findings on exam have improved significantly with the course of nystatin. He is still experiencing some tenderness in the area and we have discussed the use of corn starch to keep the skin dry. I cautioned him on the fact that because he wears a pad for occasional incontinence, that this can predispose him to multiple yeast infections. By using corn starch this may help to prevent this. He will continue to monitor and keep [place] informed he has any questions or concerns or continued follow-up with urology regarding this issue.     Carola Rhine, PAC

## 2015-08-30 NOTE — Progress Notes (Signed)
Seth Mccall here for reassessment s/p XRT for prostate cancer.  He reports that he has to strain to void, "most times", intermittent incontinence during the night, nocturia x 1. Denies any rectal irritation nor diarrhea.  Notes urge to defecate with each void and need to strain.

## 2015-09-19 ENCOUNTER — Telehealth: Payer: Self-pay | Admitting: *Deleted

## 2015-09-19 NOTE — Telephone Encounter (Signed)
Called patient to inform of fu appt. With Dr. Karsten Ro on May 9 - arrival time - 9:30 am, spoke with patient and he is aware of this appt.

## 2015-09-29 ENCOUNTER — Encounter: Payer: Self-pay | Admitting: Adult Health

## 2015-09-29 ENCOUNTER — Ambulatory Visit (HOSPITAL_BASED_OUTPATIENT_CLINIC_OR_DEPARTMENT_OTHER): Payer: PPO | Admitting: Adult Health

## 2015-09-29 VITALS — BP 134/63 | HR 59 | Temp 97.7°F | Resp 16

## 2015-09-29 DIAGNOSIS — C61 Malignant neoplasm of prostate: Secondary | ICD-10-CM

## 2015-09-29 NOTE — Progress Notes (Signed)
CLINIC:  Survivorship  REASON FOR VISIT:  Survivorship Care Plan visit & to address acute survivorship needs for recent history of prostate cancer.   BRIEF ONCOLOGY HISTORY:    Malignant neoplasm of prostate ([place])   03/24/2015 Procedure Prostate biopsy ([place]). 5/12 cores (+) for prostate adenoca. Gleason 7 (3+4) to left mid lat (30%), left apex (5%), & right apex (30%). Gleason 6 (3+3) to right mid lat (10%) & right apex lat (40%).    04/28/2015 Initial Diagnosis Malignant neoplasm of prostate ([place])   06/01/2015 - 07/28/2015 Radiation Therapy IMRT Tammi Klippel). Total dose: Prostate 78 Gy in 40 fractions     INTERVAL HISTORY:  Seth Mccall reports feeling quite well since completing radiation therapy about 2 months ago.  He endorses urinary frequency and leaking/dribbling requiring him to wear an incontinence pad.  He reports nocturia about 2x/night.  These symptoms are no worse than previously reported.  He denies any dysuria or hematuria. He states that he is not concerned with the quality of erections. Denies any diarrhea or bloody/dark stools.  Endorses occasional constipation.  He continues to struggle with groin/skin fold redness and pain as a result of the incontinence.  He denies any pruritis to this area.  He was using Nystatin powder to the area for about 2 weeks, which did not help so he stopped using.  He has started using talcum powder, which gives him some relief of his symptoms, but is still concerned.  He saw his urologist, Dr. Karsten Ro, at [facility] recently; he was told that his prostate was normal/as expected at this point in his healing.  He also had a PSA blood test but hasn't received the results of that yet.  He reports that his mood has been great. He is struggling with his mobility; he ambulates with a cane.     ADDITIONAL REVIEW OF SYSTEMS:  Review of Systems  Constitutional: Negative for malaise/fatigue.  Gastrointestinal: Negative for blood in stool and melena.    Genitourinary: Positive for frequency. Negative for dysuria and hematuria.       -Continued urinary incontinence (wears pad) -Nocturia 2x/night  Musculoskeletal: Positive for falls.       Reports recent fall unrelated to his cancer treatment; walks with a cane; now has specialty shoes to help provide stability for uneven lower limb.   Skin: Positive for rash. Negative for itching.       Redness and pain to groin/skin folds at bilateral legs  Psychiatric/Behavioral: Negative for depression. The patient is not nervous/anxious and does not have insomnia.      PAST MEDICAL & SURGICAL HISTORY:  Past Medical History  Diagnosis Date  . Hypertension   . Arthritis   . Gout   . Complication of anesthesia     limited movement in neck because of fusion  . Prostate cancer [facility])     Past Surgical History  Procedure Laterality Date  . Back surgery  2005    fusion of C3, C4, and C5  . Cataract extraction Bilateral   . Transurethral resection of prostate  10-10-2012  . Green light laser turp (transurethral resection of prostate N/A 10/10/2012    Procedure: GREEN LIGHT LASER TURP (TRANSURETHRAL RESECTION OF PROSTATE;  Surgeon: Claybon Jabs, MD;  Location: [facility];  Service: Urology;  Laterality: N/A;  . Prostate biopsy       CURRENT MEDICATIONS:  Current Outpatient Prescriptions on File Prior to Visit  Medication Sig Dispense Refill  . colchicine 0.6 MG tablet  Take 0.6 mg by mouth every other day. Reported on 06/02/2015    . lisinopril (PRINIVIL,ZESTRIL) 5 MG tablet Take 5 mg by mouth every morning. Take with a 10 mg tablet to make 15 mg     No current facility-administered medications on file prior to visit.    ALLERGIES:  No Known Allergies  SOCIAL HISTORY:   Seth Mccall is married and lives with his wife in [place].  They have 4 children, 3 sons and 1 daughter who is an internal medicine physician in [place].  One son lives in [place], another in [place], and another in  [place].  He and his wife are originally from [place].  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  They came to the [place] in 1997.  He attended classes at a community college for IT, but was unable to find employment in that field.  He subsequently worked at [employer] for 10 years and retired from there about 15 years ago.  He enjoys spending time with his 7 grandchildren.  He does not smoke and drinks alcohol occasionally.    PHYSICAL EXAM:  Vitals:  Filed Vitals:   09/29/15 1130  BP: 134/63  Pulse: 59  Temp: 97.7 F (36.5 C)  Resp: 16   There were no vitals filed for this visit.  General: Well-nourished, well-appearing male in no acute distress.  Unaccompanied today.  HEENT: Head is normocephalic.  Pupils equal and reactive to light. Conjunctivae clear without exudate.  Sclerae anicteric. Oral mucosa pink and moist.  Lymph: No cervical, supraclavicular, or infraclavicular lymphadenopathy noted on palpation.   Respiratory: Clear to auscultation bilaterally. Chest expansion symmetric; breathing non-labored.   GU: Deferred.  Recent DRE/prostate exam with urologist.  Neuro: No focal deficits. Ambulates with cane.  MSK: Difficulty changing positions; kyphosis of upper back and apparent scoliosis.  Psych: Normal mood and affect for situation. Extremities: Trace bilat lower extremity pitting edema.  Skin: Warm and dry.   LABORATORY DATA:  PSA collected at [facility]: 1.41 (09/27/15)  DIAGNOSTIC IMAGING:  None for this visit.    ASSESSMENT & PLAN:  Seth Mccall is a pleasant 82 y.o. male with history of Stage IIA (T1cN0M0) adenocarcinoma of the prostate; initial PSA 7.32; treated with radiation therapy; completed treatment on 07/28/15.  Patient presents to survivorship clinic today for survivorship care plan visit and to address any acute survivorship concerns since completing treatment.   1. Stage IIA prostate  cancer:  Seth Mccall is continuing to recover from radiation therapy.  He will see Dr. Karsten Ro at [facility] in 03/2016 for subsequent follow-up and PSA check.   Today, he received a copy of his [facility]) document, which was reviewed with him in detail.  The SCP details his cancer treatment history and potential late/long-term side effects of those treatments.  We discussed the follow-up schedule he can anticipate with interval imaging for surveillance of his cancer.  I have also shared a copy of his treatment summary/SCP with his PCP.   2. Skin excoriation likely secondary to urinary incontinence: Seth Mccall completed a 2-week course of Nystatin powder for presumed fungal infection to his inguinal region.  I explained that he likely has continued excoriation to this area given the use of incontinence pads and urine irritating his skin.  I encouraged him to continue to use the talcum powder, but I also asked that he try to get in to see his  dermatologist when he is able.  His dermatologist may be able to give him a cream or protective/barrier spray to prevent micro skin tears/further excoriation from the skin being exposed to urine.  He voiced understanding and agreed with this plan.    3. Physical activity/Healthy eating: Getting adequate physical activity and maintaining a healthy diet as a cancer survivor is important for overall wellness and reduces the risk of cancer recurrence. We discussed the [facility], which is a fitness program that is offered to cancer survivors free of charge.  We also reviewed "The Nutrition Rainbow" handout, as well as the American Cancer Society's booklet with recommendations for nutrition and physical activity.  He was given a Engineer, civil (consulting) with instructions on how to get enrolled in this program if he chooses to do so.    4. Health promotion/Cancer screening:  Seth Mccall was given the national recommendations for  colonoscopy, skin screenings, and vaccinations.  I encouraged him to talk with his PCP about arranging appropriate cancer screening tests, as clinically indicated.  5. Support services/Counseling: It is not uncommon for this period of the patient's cancer care trajectory to be one of many emotions and stressors.  At this time, he appears to be coping quite well with adequate support in place.  Seth Mccall was encouraged to take advantage of our many support services programs, support groups, and/or counseling in coping with her new life as a cancer survivor after completing anti-cancer treatment. I specifically highlighted for him when the Prostate Cancer Support Group is held here at the cancer center.  The fellowship of other survivors/caregivers in group therapy facilitated by our prostate cancer support group leader could be helpful for both the patient and his wife.   He was given a calendar of the cancer center's support services events, as well as brochures for our spiritual care and free counseling resources.    Dispo:  -Return to survivorship clinic as needed; no additional follow-up needed at this time.   -Consider transitioning the patient to long-term survivorship, when clinically appropriate.   A total of 45 minutes was spent in face-to-face care of this patient, with greater than 50% of that time spent in counseling and care coordination.   Mike Craze, NP Survivorship Program [place] [phone]   (Coding/Billing notation: This patient is a no charge per Dr. Pablo Ledger, Medical Director of Survivorship)

## 2015-10-11 ENCOUNTER — Other Ambulatory Visit: Payer: Self-pay | Admitting: Internal Medicine

## 2015-10-11 DIAGNOSIS — E042 Nontoxic multinodular goiter: Secondary | ICD-10-CM

## 2015-10-18 ENCOUNTER — Ambulatory Visit
Admission: RE | Admit: 2015-10-18 | Discharge: 2015-10-18 | Disposition: A | Discharge status: Home / Self Care | Payer: PPO | Source: Ambulatory Visit | Attending: Internal Medicine | Admitting: Internal Medicine

## 2015-10-18 DIAGNOSIS — E042 Nontoxic multinodular goiter: Secondary | ICD-10-CM

## 2016-03-22 ENCOUNTER — Telehealth (INDEPENDENT_AMBULATORY_CARE_PROVIDER_SITE_OTHER): Payer: Self-pay | Admitting: Physical Medicine and Rehabilitation

## 2016-03-22 NOTE — Telephone Encounter (Signed)
I do not think I was the one who ordered this?

## 2016-03-23 NOTE — Telephone Encounter (Signed)
Faxed new order and notified patient.

## 2017-03-29 ENCOUNTER — Telehealth (INDEPENDENT_AMBULATORY_CARE_PROVIDER_SITE_OTHER): Payer: Self-pay | Admitting: Physical Medicine and Rehabilitation

## 2017-03-29 NOTE — Telephone Encounter (Signed)
Yes this was a difficult patient history and treatment.  If we have the prior prescription copied it would be nice to be able to reproduce that.

## 2017-04-02 NOTE — Telephone Encounter (Signed)
Called and left message that prescription is available for pickup at our front desk.

## 2017-08-10 ENCOUNTER — Encounter (HOSPITAL_COMMUNITY): Payer: Self-pay | Admitting: Emergency Medicine

## 2017-08-10 ENCOUNTER — Emergency Department (HOSPITAL_COMMUNITY): Payer: Medicare HMO

## 2017-08-10 ENCOUNTER — Inpatient Hospital Stay (HOSPITAL_COMMUNITY)
Admission: EM | Admit: 2017-08-10 | Discharge: 2017-08-13 | DRG: 247 | Disposition: A | Discharge status: Home Health | Payer: Medicare HMO | Attending: Cardiology | Admitting: Cardiology

## 2017-08-10 DIAGNOSIS — Z8546 Personal history of malignant neoplasm of prostate: Secondary | ICD-10-CM

## 2017-08-10 DIAGNOSIS — R079 Chest pain, unspecified: Secondary | ICD-10-CM | POA: Diagnosis not present

## 2017-08-10 DIAGNOSIS — E785 Hyperlipidemia, unspecified: Secondary | ICD-10-CM | POA: Diagnosis present

## 2017-08-10 DIAGNOSIS — I10 Essential (primary) hypertension: Secondary | ICD-10-CM | POA: Diagnosis present

## 2017-08-10 DIAGNOSIS — Z79899 Other long term (current) drug therapy: Secondary | ICD-10-CM

## 2017-08-10 DIAGNOSIS — M4800 Spinal stenosis, site unspecified: Secondary | ICD-10-CM | POA: Diagnosis present

## 2017-08-10 DIAGNOSIS — Z87891 Personal history of nicotine dependence: Secondary | ICD-10-CM

## 2017-08-10 DIAGNOSIS — I200 Unstable angina: Secondary | ICD-10-CM | POA: Diagnosis present

## 2017-08-10 DIAGNOSIS — Z9842 Cataract extraction status, left eye: Secondary | ICD-10-CM

## 2017-08-10 DIAGNOSIS — I25110 Atherosclerotic heart disease of native coronary artery with unstable angina pectoris: Principal | ICD-10-CM | POA: Diagnosis present

## 2017-08-10 DIAGNOSIS — W19XXXA Unspecified fall, initial encounter: Secondary | ICD-10-CM | POA: Diagnosis present

## 2017-08-10 DIAGNOSIS — Y92239 Unspecified place in hospital as the place of occurrence of the external cause: Secondary | ICD-10-CM | POA: Diagnosis present

## 2017-08-10 DIAGNOSIS — Z9841 Cataract extraction status, right eye: Secondary | ICD-10-CM

## 2017-08-10 DIAGNOSIS — R269 Unspecified abnormalities of gait and mobility: Secondary | ICD-10-CM | POA: Diagnosis present

## 2017-08-10 DIAGNOSIS — Z955 Presence of coronary angioplasty implant and graft: Secondary | ICD-10-CM

## 2017-08-10 LAB — CBC WITH DIFFERENTIAL/PLATELET
BASOS PCT: 0 %
Basophils Absolute: 0 10*3/uL (ref 0.0–0.1)
EOS ABS: 0.2 10*3/uL (ref 0.0–0.7)
Eosinophils Relative: 4 %
HCT: 40.1 % (ref 39.0–52.0)
Hemoglobin: 13.5 g/dL (ref 13.0–17.0)
LYMPHS ABS: 1.5 10*3/uL (ref 0.7–4.0)
Lymphocytes Relative: 32 %
MCH: 32.5 pg (ref 26.0–34.0)
MCHC: 33.7 g/dL (ref 30.0–36.0)
MCV: 96.4 fL (ref 78.0–100.0)
MONOS PCT: 7 %
Monocytes Absolute: 0.3 10*3/uL (ref 0.1–1.0)
Neutro Abs: 2.7 10*3/uL (ref 1.7–7.7)
Neutrophils Relative %: 57 %
PLATELETS: 181 10*3/uL (ref 150–400)
RBC: 4.16 MIL/uL — ABNORMAL LOW (ref 4.22–5.81)
RDW: 13.4 % (ref 11.5–15.5)
WBC: 4.7 10*3/uL (ref 4.0–10.5)

## 2017-08-10 LAB — I-STAT TROPONIN, ED: TROPONIN I, POC: 0 ng/mL (ref 0.00–0.08)

## 2017-08-10 LAB — BASIC METABOLIC PANEL
Anion gap: 9 (ref 5–15)
BUN: 20 mg/dL (ref 6–20)
CO2: 25 mmol/L (ref 22–32)
CREATININE: 0.77 mg/dL (ref 0.61–1.24)
Calcium: 8.9 mg/dL (ref 8.9–10.3)
Chloride: 102 mmol/L (ref 101–111)
GFR calc Af Amer: >60 mL/min (ref >60)
Glucose, Bld: 94 mg/dL (ref 65–99)
Potassium: 4.5 mmol/L (ref 3.5–5.1)
SODIUM: 136 mmol/L (ref 135–145)

## 2017-08-10 LAB — LIPID PANEL
CHOL/HDL RATIO: 3 ratio
CHOLESTEROL: 180 mg/dL (ref 0–200)
HDL: 60 mg/dL (ref >40)
LDL Cholesterol: 102 mg/dL — ABNORMAL HIGH (ref 0–99)
Triglycerides: 91 mg/dL (ref <150)
VLDL: 18 mg/dL (ref 0–40)

## 2017-08-10 LAB — TROPONIN I: Troponin I: <0.03 ng/mL (ref <0.03)

## 2017-08-10 LAB — TSH: TSH: 1.676 u[IU]/mL (ref 0.350–4.500)

## 2017-08-10 MED ORDER — NITROGLYCERIN 2 % TD OINT
1.0000 [in_us] | TOPICAL_OINTMENT | Freq: Once | TRANSDERMAL | Status: AC
  Administered 2017-08-10: 1 [in_us] via TOPICAL
  Filled 2017-08-10: qty 1

## 2017-08-10 MED ORDER — METOPROLOL SUCCINATE ER 25 MG PO TB24
12.5000 mg | ORAL_TABLET | Freq: Every day | ORAL | Status: DC
Start: 2017-08-10 — End: 2017-08-13
  Administered 2017-08-10 – 2017-08-13 (×4): 12.5 mg via ORAL
  Filled 2017-08-10 (×6): qty 1

## 2017-08-10 MED ORDER — NITROGLYCERIN 0.4 MG SL SUBL
0.4000 mg | SUBLINGUAL_TABLET | SUBLINGUAL | Status: DC | PRN
  Administered 2017-08-11 (×6): 0.4 mg via SUBLINGUAL
  Filled 2017-08-10 (×2): qty 1

## 2017-08-10 MED ORDER — ATORVASTATIN CALCIUM 10 MG PO TABS
10.0000 mg | ORAL_TABLET | Freq: Every day | ORAL | Status: DC
  Administered 2017-08-10 – 2017-08-13 (×4): 10 mg via ORAL
  Filled 2017-08-10 (×4): qty 1

## 2017-08-10 MED ORDER — ONDANSETRON HCL 4 MG/2ML IJ SOLN: 4.0000 mg | Freq: Four times a day (QID) | INTRAMUSCULAR | Status: DC | PRN

## 2017-08-10 MED ORDER — HEPARIN (PORCINE) IN NACL 100-0.45 UNIT/ML-% IJ SOLN
1050.0000 [IU]/h | INTRAMUSCULAR | Status: DC
Start: 2017-08-10 — End: 2017-08-12
  Administered 2017-08-10: 850 [IU]/h via INTRAVENOUS
  Administered 2017-08-11: 1050 [IU]/h via INTRAVENOUS
  Filled 2017-08-10 (×2): qty 250

## 2017-08-10 MED ORDER — HEPARIN BOLUS VIA INFUSION
4000.0000 [IU] | Freq: Once | INTRAVENOUS | Status: AC
  Administered 2017-08-10: 4000 [IU] via INTRAVENOUS
  Filled 2017-08-10: qty 4000

## 2017-08-10 MED ORDER — NITROGLYCERIN 2 % TD OINT: 1.0000 [in_us] | TOPICAL_OINTMENT | Freq: Every day | TRANSDERMAL | Status: DC

## 2017-08-10 MED ORDER — GI COCKTAIL ~~LOC~~
30.0000 mL | Freq: Once | ORAL | Status: AC
  Administered 2017-08-10: 30 mL via ORAL
  Filled 2017-08-10: qty 30

## 2017-08-10 MED ORDER — FENTANYL CITRATE (PF) 100 MCG/2ML IJ SOLN
25.0000 ug | Freq: Once | INTRAMUSCULAR | Status: AC
  Administered 2017-08-10: 25 ug via INTRAVENOUS
  Filled 2017-08-10: qty 2

## 2017-08-10 MED ORDER — ASPIRIN EC 81 MG PO TBEC
81.0000 mg | DELAYED_RELEASE_TABLET | Freq: Every day | ORAL | Status: DC
  Administered 2017-08-11 – 2017-08-13 (×2): 81 mg via ORAL
  Filled 2017-08-10 (×2): qty 1

## 2017-08-10 MED ORDER — ACETAMINOPHEN 325 MG PO TABS: 650.0000 mg | ORAL_TABLET | ORAL | Status: DC | PRN

## 2017-08-10 NOTE — ED Notes (Signed)
Patient transported to CT 

## 2017-08-10 NOTE — Progress Notes (Signed)
ANTICOAGULATION CONSULT NOTE - Initial Consult  Pharmacy Consult for heparin Indication: chest pain/ACS  No Known Allergies  Patient Measurements: Height: 5\' 3"  (160 cm) Weight: 157 lb (71.2 kg) IBW/kg (Calculated) : 56.9 Heparin Dosing Weight: 71.2 kg   Vital Signs: Temp: 98.1 F (36.7 C) (03/23 1405) Temp Source: Oral (03/23 1405) BP: 123/60 (03/23 1630) Pulse Rate: 59 (03/23 1630)  Labs: Recent Labs    08/10/17 1410  HGB 13.5  HCT 40.1  PLT 181  CREATININE 0.77    Estimated Creatinine Clearance: 59.8 mL/min (by C-G formula based on SCr of 0.77 mg/dL).   Medical History: Past Medical History:  Diagnosis Date  . Arthritis   . Complication of anesthesia    limited movement in neck because of fusion  . Gout   . Hypertension   . Prostate cancer ([place])     Medications:  Scheduled:  . [START ON 08/11/2017] aspirin EC  81 mg Oral Daily  . atorvastatin  10 mg Oral q1800  . metoprolol succinate  12.5 mg Oral Daily    Assessment: 52 yom presenting with acute chest pain from home (received 3 nitro and 325 mg aspirin by EMS). No anticoag PTA.   Hgb is 13.5, plts WNL. No signs/symptoms of bleeding. Scr 0.77 (CrCl 59 mL/min).   Goal of Therapy:  Heparin level 0.3-0.7 units/ml Monitor platelets by anticoagulation protocol: Yes   Plan:  Give 4000 units bolus x 1 Start heparin infusion at 850 units/hr Check anti-Xa level in 8 hours and daily while on heparin Continue to monitor H&H and platelets  Doylene Canard, PharmD Clinical Pharmacist  Pager: [phone] Clinical Phone for 08/10/2017 until 3:30pm: [phone] If after 3:30pm, please call main pharmacy at [phone] 08/10/2017,4:56 PM

## 2017-08-10 NOTE — ED Notes (Signed)
Daughter's phone number: [phone]

## 2017-08-10 NOTE — ED Provider Notes (Signed)
[facility] EMERGENCY DEPARTMENT Provider Note   CSN: [id number] Arrival date & time: 08/10/17  1401     History   Chief Complaint Chief Complaint  Patient presents with  . Chest Pain    HPI Seth Mccall is a 84 y.o. male.  The history is provided by the patient and medical records. No language interpreter was used.   Seth Mccall is a 84 y.o. male  with a PMH of HTN who presents to the Emergency Department complaining of acute onset of central chest tightness which radiates across entire chest wall that began at 12:30 PM today.  Denies any associated nausea, vomiting, diaphoresis.  Feels as if he may have had a little shortness of breath initially, but this is resolved.  He denies any history of similar.  He was given 324 ASA and 3 nitro by EMS.  He does report some improvement in chest pain with medications, but still is not chest pain-free.  Denies history of diabetes, high cholesterol or heart disease.  Past Medical History:  Diagnosis Date  . Arthritis   . Complication of anesthesia    limited movement in neck because of fusion  . Gout   . Hypertension   . Prostate cancer [facility])     Patient Active Problem List   Diagnosis Date Noted  . Candida infection 08/30/2015  . Malignant neoplasm of prostate ([place]) 04/28/2015  . BPH (benign prostatic hypertrophy) with urinary obstruction 10/09/2012    Past Surgical History:  Procedure Laterality Date  . BACK SURGERY  2005   fusion of C3, C4, and C5  . CATARACT EXTRACTION Bilateral   . GREEN LIGHT LASER TURP (TRANSURETHRAL RESECTION OF PROSTATE N/A 10/10/2012   Procedure: GREEN LIGHT LASER TURP (TRANSURETHRAL RESECTION OF PROSTATE;  Surgeon: Claybon Jabs, MD;  Location: [facility] ORS;  Service: Urology;  Laterality: N/A;  . PROSTATE BIOPSY    . TRANSURETHRAL RESECTION OF PROSTATE  10-10-2012        Home Medications    Prior to Admission medications   Medication Sig Start Date End Date  Taking? Authorizing Provider  colchicine 0.6 MG tablet Take 0.6 mg by mouth every other day. Reported on 06/02/2015    [provider]  lisinopril (PRINIVIL,ZESTRIL) 5 MG tablet Take 5 mg by mouth every morning. Take with a 10 mg tablet to make 15 mg    [provider]    Family History No family history on file.  Social History Social History   Tobacco Use  . Smoking status: Former Smoker    Packs/day: 0.50    Years: 4.00    Pack years: 2.00    Types: Cigarettes    Last attempt to quit: 05/21/1968    Years since quitting: 49.2  . Smokeless tobacco: Never Used  Substance Use Topics  . Alcohol use: Yes    Comment: occasional  . Drug use: No     Allergies   Patient has no known allergies.   Review of Systems Review of Systems  Respiratory: Positive for shortness of breath (Resolved).   Cardiovascular: Positive for chest pain.  All other systems reviewed and are negative.    Physical Exam Updated Vital Signs BP (!) 133/59 (BP Location: Left Arm)   Pulse 63   Temp 98.1 F (36.7 C) (Oral)   Ht 5\' 3"  (1.6 m)   Wt 71.2 kg (157 lb)   SpO2 97%   BMI 27.81 kg/m   Physical Exam  Constitutional:  He is oriented to person, place, and time. He appears well-developed and well-nourished. No distress.  HENT:  Head: Normocephalic and atraumatic.  Cardiovascular: Normal rate, regular rhythm and normal heart sounds.  No murmur heard. Pulmonary/Chest: Effort normal and breath sounds normal. No respiratory distress.  Abdominal: Soft. He exhibits no distension. There is no tenderness.  Musculoskeletal: He exhibits no edema.  Neurological: He is alert and oriented to person, place, and time.  Skin: Skin is warm and dry.  Nursing note and vitals reviewed.    ED Treatments / Results  Labs (all labs ordered are listed, but only abnormal results are displayed) Labs Reviewed  CBC WITH DIFFERENTIAL/PLATELET  BASIC METABOLIC PANEL  I-STAT TROPONIN, ED     EKG EKG Interpretation  Date/Time:  Saturday August 10 2017 14:03:42 EDT Ventricular Rate:  179 PR Interval:    QRS Duration: 97 QT Interval:  215 QTC Calculation: 273 R Axis:   1 Text Interpretation:  Paired ventricular premature complexes Low voltage, precordial leads Nonspecific T abnormalities, lateral leads Artifact in lead(s) I II III aVR aVL aVF V1 V2 V3 V5 V6 and baseline wander in lead(s) V2 V4 V5 T wave inversions inferiorly new from previous Confirmed by Theotis Burrow [phone]) on 08/10/2017 2:18:10 PM   Radiology No results found.  Procedures Procedures (including critical care time)  Medications Ordered in ED Medications  nitroGLYCERIN (NITROGLYN) 2 % ointment 1 inch (has no administration in time range)     Initial Impression / Assessment and Plan / ED Course  I have reviewed the triage vital signs and the nursing notes.  Pertinent labs & imaging results that were available during my care of the patient were reviewed by me and considered in my medical decision making (see chart for details).    Seth Mccall is a 84 y.o. male who presents to ED for chest pain which began today at 12:30 PM. EKG with inferior T wave changes compared to previous in 2014. Trop negative. Cardiology consulted who has evaluated patient and will admit for further work up.   Patient seen by and discussed with Dr. Rex Kras who agrees with treatment plan.    Final Clinical Impressions(s) / ED Diagnoses   Final diagnoses:  Chest pain    ED Discharge Orders    None       Ward, Ozella Almond, PA-C 08/10/17 1739    Little, Wenda Overland, MD 08/13/17 1059

## 2017-08-10 NOTE — ED Triage Notes (Signed)
Pt arrives via EMS from home with complaints of chest pain that began at 12:30. Pt describes it as a burning and tightness. EMS gave 3 nitro and 325 ASA.

## 2017-08-10 NOTE — H&P (Signed)
Seth Mccall is an 84 y.o. male.   Chief Complaint: Chest pain HPI: Patient is an [place] male with no significant cardiovascular risk factors except for very mild hypertension for which he takes 5 mg of lisinopril daily, history of gout for which he takes colchicine on a as needed basis, cervical spine fusion with mild residual right-sided weakness, was doing well until this morning suddenly developed chest tightness and burning sensation in the middle of the chest.  Symptoms persisted, for which he took aspirin at home and EMS was activated due to persistent symptoms.  He was brought to the emergency room, sublingual nitroglycerin did relieve chest tightness but he still continued to have burning sensation in the chest.  GI cocktail did not completely relieve the burning sensation but did help some.  Patient requested me to evaluate him further.  His daughter who is a physician, is present at the bedside.  No other associated symptoms.  Past Medical History:  Diagnosis Date  . Arthritis   . Complication of anesthesia    limited movement in neck because of fusion  . Gout   . Hypertension   . Prostate cancer [facility])     Past Surgical History:  Procedure Laterality Date  . BACK SURGERY  2005   fusion of C3, C4, and C5  . CATARACT EXTRACTION Bilateral   . GREEN LIGHT LASER TURP (TRANSURETHRAL RESECTION OF PROSTATE N/A 10/10/2012   Procedure: GREEN LIGHT LASER TURP (TRANSURETHRAL RESECTION OF PROSTATE;  Surgeon: Claybon Jabs, MD;  Location: [facility];  Service: Urology;  Laterality: N/A;  . PROSTATE BIOPSY    . TRANSURETHRAL RESECTION OF PROSTATE  10-10-2012   Family history: There is no history of premature coronary artery disease or diabetes in the family.  Social History:  reports that he quit smoking about 49 years ago. His smoking use included cigarettes. He has a 2.00 pack-year smoking history. He has never used smokeless tobacco. He reports that he drinks alcohol. He reports that  he does not use drugs.  He lives independently with his wife, he is the caregiver for his wife.  Allergies: No Known Allergies   Results for orders placed or performed during the hospital encounter of 08/10/17 (from the past 48 hour(s))  CBC with Differential     Status: Abnormal   Collection Time: 08/10/17  2:10 PM  Result Value Ref Range   WBC 4.7 4.0 - 10.5 K/uL   RBC 4.16 (L) 4.22 - 5.81 MIL/uL   Hemoglobin 13.5 13.0 - 17.0 g/dL   HCT 40.1 39.0 - 52.0 %   MCV 96.4 78.0 - 100.0 fL   MCH 32.5 26.0 - 34.0 pg   MCHC 33.7 30.0 - 36.0 g/dL   RDW 13.4 11.5 - 15.5 %   Platelets 181 150 - 400 K/uL   Neutrophils Relative % 57 %   Neutro Abs 2.7 1.7 - 7.7 K/uL   Lymphocytes Relative 32 %   Lymphs Abs 1.5 0.7 - 4.0 K/uL   Monocytes Relative 7 %   Monocytes Absolute 0.3 0.1 - 1.0 K/uL   Eosinophils Relative 4 %   Eosinophils Absolute 0.2 0.0 - 0.7 K/uL   Basophils Relative 0 %   Basophils Absolute 0.0 0.0 - 0.1 K/uL    Comment: Performed at [facility], [address] [place], [place] [address]  Basic metabolic panel     Status: None   Collection Time: 08/10/17  2:10 PM  Result Value Ref Range  Sodium 136 135 - 145 mmol/L   Potassium 4.5 3.5 - 5.1 mmol/L   Chloride 102 101 - 111 mmol/L   CO2 25 22 - 32 mmol/L   Glucose, Bld 94 65 - 99 mg/dL   BUN 20 6 - 20 mg/dL   Creatinine, Ser 0.77 0.61 - 1.24 mg/dL   Calcium 8.9 8.9 - 10.3 mg/dL   GFR calc non Af Amer >60 >60 mL/min   GFR calc Af Amer >60 >60 mL/min    Comment: (NOTE) The eGFR has been calculated using the CKD EPI equation. This calculation has not been validated in all clinical situations. eGFR's persistently <60 mL/min signify possible Chronic Kidney Disease.    Anion gap 9 5 - 15    Comment: Performed at [facility] [address], [place], [place] [address]  I-stat troponin, ED     Status: None   Collection Time: 08/10/17  2:17 PM  Result Value Ref Range   Troponin i, poc 0.00 0.00 - 0.08 ng/mL    Comment 3            Comment: Due to the release kinetics of cTnI, a negative result within the first hours of the onset of symptoms does not rule out myocardial infarction with certainty. If myocardial infarction is still suspected, repeat the test at appropriate intervals.    Dg Chest 2 View  Result Date: 08/10/2017 CLINICAL DATA:  Burning chest pain and tightness since 10:30 a.m. today. EXAM: CHEST - 2 VIEW COMPARISON:  10/06/2012. FINDINGS: The cardiac silhouette is mildly enlarged with a mild increase in size. Small bilateral calcified granulomata, greater on the right. Thoracic spine degenerative changes. IMPRESSION: No acute abnormality.  Mildly progressive cardiomegaly. Electronically Signed   By: Claudie Revering M.D.   On: 08/10/2017 14:59    Review of Systems  Constitutional: Negative.   HENT: Negative.   Eyes: Negative.   Respiratory: Negative.   Cardiovascular: Positive for chest pain. Negative for palpitations, orthopnea, claudication, leg swelling and PND.  Gastrointestinal: Negative.   Genitourinary: Negative.   Musculoskeletal: Positive for back pain (uses cane to support) and joint pain. Negative for falls.  Skin: Negative.   Neurological: Positive for focal weakness (right arma dn leg minimal weakness since spine surgery).  Endo/Heme/Allergies: Negative.   Psychiatric/Behavioral: Negative.   All other systems reviewed and are negative.  EKG 08/10/2017: Normal sinus rhythm, left axis deviation, deep T wave inversion in 3 and aVF suggestive of inferior ischemia.  Compared to EKG performed by EMS at home, EKG changes are new.  Repeat EKG reveals resolution of T wave inversion  Blood pressure 123/60, pulse (!) 59, temperature 98.1 F (36.7 C), temperature source Oral, resp. rate (!) 21, height 5' 3"  (1.6 m), weight 71.2 kg (157 lb), SpO2 98 %. Physical Exam  Constitutional: He is oriented to person, place, and time. He appears well-developed and well-nourished.  HENT:   Head: Atraumatic.  Eyes: Conjunctivae are normal.  Neck: Neck supple.  Cardiovascular: Normal rate, regular rhythm, normal heart sounds and intact distal pulses.  Distant heart sounds.  Normal vascular exam with intact pulses including carotids.  Respiratory: Effort normal and breath sounds normal.  Pectus excavatum noted  GI: Soft. Bowel sounds are normal.  obese  Musculoskeletal: Normal range of motion. He exhibits edema (2+ bilateral below knee leg edema, pigmentation present, pitting edema).  Neurological: He is alert and oriented to person, place, and time.  Skin: Skin is warm and dry.  Psychiatric:  He has a normal mood and affect.     Assessment/Plan 1.  Chest pain suggestive of unstable angina pectoris, dynamic EKG abnormalities as dictated above. 2.  Very mild hypertension Recommendation: Patient's major risk factors include his age and active EKG changes.  First set of cardiac markers are negative for myocardial injury.  I will start the patient on IV heparin until cardiac infection is ruled out, I will set him up for Lexiscan Myoview stress test in the morning.  We will also obtain an echocardiogram.  If low risk stress test he can be discharged home otherwise he will need coronary angiography depending on stress test results.  Patient has very well controlled hypertension, blood pressure is around 947 mmHg systolic.  He is on 5 mg of lisinopril which I have held, we will start him on metoprolol succinate 12.5 mg daily for now.  Concerned about using higher dose of beta-blocker in view of underlying borderline resting sinus  bradycardia.  At the request of his daughter, I had end-of-life discussions with him.  This is additional 20 minutes of discussion regarding various forms of decision making including CPR, intubation, life prolonging procedures, DNR status and meaning of DNR status.  At this point, patient wishes to be full code but states that if physicians decide that it is  futile care, he understands that he can be made DNR.  His daughter is present at the bedside during discussion.  Adrian Prows, MD 08/10/2017, 5:20 PM

## 2017-08-11 ENCOUNTER — Other Ambulatory Visit (HOSPITAL_COMMUNITY): Payer: PPO

## 2017-08-11 DIAGNOSIS — M4800 Spinal stenosis, site unspecified: Secondary | ICD-10-CM | POA: Diagnosis present

## 2017-08-11 DIAGNOSIS — I10 Essential (primary) hypertension: Secondary | ICD-10-CM | POA: Diagnosis present

## 2017-08-11 DIAGNOSIS — I25110 Atherosclerotic heart disease of native coronary artery with unstable angina pectoris: Secondary | ICD-10-CM | POA: Diagnosis present

## 2017-08-11 DIAGNOSIS — R079 Chest pain, unspecified: Secondary | ICD-10-CM | POA: Diagnosis present

## 2017-08-11 DIAGNOSIS — R269 Unspecified abnormalities of gait and mobility: Secondary | ICD-10-CM | POA: Diagnosis present

## 2017-08-11 DIAGNOSIS — Z955 Presence of coronary angioplasty implant and graft: Secondary | ICD-10-CM | POA: Diagnosis not present

## 2017-08-11 DIAGNOSIS — Z9841 Cataract extraction status, right eye: Secondary | ICD-10-CM | POA: Diagnosis not present

## 2017-08-11 DIAGNOSIS — Y92239 Unspecified place in hospital as the place of occurrence of the external cause: Secondary | ICD-10-CM | POA: Diagnosis present

## 2017-08-11 DIAGNOSIS — W19XXXA Unspecified fall, initial encounter: Secondary | ICD-10-CM | POA: Diagnosis present

## 2017-08-11 DIAGNOSIS — Z8546 Personal history of malignant neoplasm of prostate: Secondary | ICD-10-CM | POA: Diagnosis not present

## 2017-08-11 DIAGNOSIS — E785 Hyperlipidemia, unspecified: Secondary | ICD-10-CM | POA: Diagnosis present

## 2017-08-11 DIAGNOSIS — Z79899 Other long term (current) drug therapy: Secondary | ICD-10-CM | POA: Diagnosis not present

## 2017-08-11 DIAGNOSIS — Z87891 Personal history of nicotine dependence: Secondary | ICD-10-CM | POA: Diagnosis not present

## 2017-08-11 DIAGNOSIS — Z9842 Cataract extraction status, left eye: Secondary | ICD-10-CM | POA: Diagnosis not present

## 2017-08-11 LAB — COMPREHENSIVE METABOLIC PANEL
ALBUMIN: 3.3 g/dL — AB (ref 3.5–5.0)
ALT: 25 U/L (ref 17–63)
ANION GAP: 9 (ref 5–15)
AST: 25 U/L (ref 15–41)
Alkaline Phosphatase: 52 U/L (ref 38–126)
BILIRUBIN TOTAL: 0.7 mg/dL (ref 0.3–1.2)
BUN: 22 mg/dL — ABNORMAL HIGH (ref 6–20)
CO2: 22 mmol/L (ref 22–32)
Calcium: 8.7 mg/dL — ABNORMAL LOW (ref 8.9–10.3)
Chloride: 105 mmol/L (ref 101–111)
Creatinine, Ser: 1.01 mg/dL (ref 0.61–1.24)
GFR calc Af Amer: >60 mL/min (ref >60)
Glucose, Bld: 83 mg/dL (ref 65–99)
POTASSIUM: 4 mmol/L (ref 3.5–5.1)
Sodium: 136 mmol/L (ref 135–145)
TOTAL PROTEIN: 6.2 g/dL — AB (ref 6.5–8.1)

## 2017-08-11 LAB — CBC
HEMATOCRIT: 38.5 % — AB (ref 39.0–52.0)
Hemoglobin: 13.2 g/dL (ref 13.0–17.0)
MCH: 32.5 pg (ref 26.0–34.0)
MCHC: 34.3 g/dL (ref 30.0–36.0)
MCV: 94.8 fL (ref 78.0–100.0)
PLATELETS: 161 10*3/uL (ref 150–400)
RBC: 4.06 MIL/uL — ABNORMAL LOW (ref 4.22–5.81)
RDW: 12.9 % (ref 11.5–15.5)
WBC: 6.2 10*3/uL (ref 4.0–10.5)

## 2017-08-11 LAB — HEPARIN LEVEL (UNFRACTIONATED)
HEPARIN UNFRACTIONATED: 0.26 [IU]/mL — AB (ref 0.30–0.70)
Heparin Unfractionated: 0.34 IU/mL (ref 0.30–0.70)
Heparin Unfractionated: 0.24 IU/mL — ABNORMAL LOW (ref 0.30–0.70)

## 2017-08-11 LAB — TROPONIN I: Troponin I: <0.03 ng/mL (ref <0.03)

## 2017-08-11 MED ORDER — FENTANYL CITRATE (PF) 100 MCG/2ML IJ SOLN
25.0000 ug | INTRAMUSCULAR | Status: DC | PRN
  Administered 2017-08-11: 25 ug via INTRAVENOUS
  Filled 2017-08-11: qty 2

## 2017-08-11 MED ORDER — ASPIRIN 81 MG PO CHEW
81.0000 mg | CHEWABLE_TABLET | ORAL | Status: AC
  Administered 2017-08-12: 81 mg via ORAL
  Filled 2017-08-11: qty 1

## 2017-08-11 MED ORDER — SODIUM CHLORIDE 0.9 % IV SOLN: 250.0000 mL | INTRAVENOUS | Status: DC | PRN

## 2017-08-11 MED ORDER — SODIUM CHLORIDE 0.9 % WEIGHT BASED INFUSION
3.0000 mL/kg/h | INTRAVENOUS | Status: DC
  Administered 2017-08-12: 3 mL/kg/h via INTRAVENOUS

## 2017-08-11 MED ORDER — SODIUM CHLORIDE 0.9 % WEIGHT BASED INFUSION: 1.0000 mL/kg/h | INTRAVENOUS | Status: DC

## 2017-08-11 MED ORDER — NITROGLYCERIN IN D5W 200-5 MCG/ML-% IV SOLN
0.0000 ug/min | INTRAVENOUS | Status: DC
  Administered 2017-08-11: 5 ug/min via INTRAVENOUS
  Filled 2017-08-11: qty 250

## 2017-08-11 MED ORDER — HEPARIN BOLUS VIA INFUSION
1000.0000 [IU] | Freq: Once | INTRAVENOUS | Status: AC
  Administered 2017-08-11: 1000 [IU] via INTRAVENOUS
  Filled 2017-08-11: qty 1000

## 2017-08-11 MED ORDER — SODIUM CHLORIDE 0.9% FLUSH
3.0000 mL | Freq: Two times a day (BID) | INTRAVENOUS | Status: DC
  Administered 2017-08-11: 3 mL via INTRAVENOUS

## 2017-08-11 MED ORDER — SODIUM CHLORIDE 0.9% FLUSH: 3.0000 mL | INTRAVENOUS | Status: DC | PRN

## 2017-08-11 NOTE — H&P (View-Only) (Signed)
Subjective:  He had an episode of chest discomfort earlier this morning and had to be started on IV nitroglycerin.  Chest tightness with radiation to the left arm, subsided with intravenous nitroglycerin.  He was scheduled for a stress test this morning which is canceled.  Presently remains asymptomatic. Objective:  Vital Signs in the last 24 hours: Temp:  [97.7 F (36.5 C)-98.7 F (37.1 C)] 98.7 F (37.1 C) (03/24 0456) Pulse Rate:  [55-97] 60 (03/24 0456) Resp:  [15-24] 20 (03/24 0510) BP: (88-145)/(50-78) 105/70 (03/24 0510) SpO2:  [93 %-99 %] 98 % (03/24 0456) Weight:  [69.9 kg (154 lb 1.6 oz)-71.2 kg (157 lb)] 69.9 kg (154 lb 1.6 oz) (03/23 1859)  Intake/Output from previous day: 03/23 0701 - 03/24 0700 In: 161.5 [P.O.:60; I.V.:101.5] Out: -   Physical Exam: Blood pressure 105/70, pulse 60, temperature 98.7 F (37.1 C), temperature source Oral, resp. rate 20, height 5\' 3"  (1.6 m), weight 69.9 kg (154 lb 1.6 oz), SpO2 98 %.  Constitutional: He is oriented to person, place, and time. He appears well-developed and well-nourished.  HENT:  Head: Atraumatic.  Eyes: Conjunctivae are normal.  Neck: Neck supple.  Cardiovascular: Normal rate, regular rhythm, normal heart sounds and intact distal pulses.  Distant heart sounds.  Normal vascular exam with intact pulses including carotids.  Respiratory: Effort normal and breath sounds normal.  Pectus excavatum noted  GI: Soft. Bowel sounds are normal.  obese  Musculoskeletal: Normal range of motion. He exhibits edema (2+ bilateral below knee pitting leg edema, pigmentation present).  Neurological: He is alert and oriented to person, place, and time.  Skin: Skin is warm and dry.  Psychiatric: He has a normal mood and affect.    Lab Results: BMP Recent Labs    08/10/17 1410 08/10/17 2307  NA 136 136  K 4.5 4.0  CL 102 105  CO2 25 22  GLUCOSE 94 83  BUN 20 22*  CREATININE 0.77 1.01  CALCIUM 8.9 8.7*  GFRNONAA >60 >60  GFRAA  >60 >60    CBC Recent Labs  Lab 08/10/17 1410 08/11/17 0249  WBC 4.7 6.2  RBC 4.16* 4.06*  HGB 13.5 13.2  HCT 40.1 38.5*  PLT 181 161  MCV 96.4 94.8  MCH 32.5 32.5  MCHC 33.7 34.3  RDW 13.4 12.9  LYMPHSABS 1.5  --   MONOABS 0.3  --   EOSABS 0.2  --   BASOSABS 0.0  --    Cardiac Panel (last 3 results) Recent Labs    08/10/17 1636 08/10/17 2307 08/11/17 0249  TROPONINI <0.03 <0.03 <0.03   TSH Recent Labs    08/10/17 1710  TSH 1.676    Lipid Panel     Component Value Date/Time   CHOL 180 08/10/2017 1703   TRIG 91 08/10/2017 1703   HDL 60 08/10/2017 1703   CHOLHDL 3.0 08/10/2017 1703   VLDL 18 08/10/2017 1703   LDLCALC 102 (H) 08/10/2017 1703     Hepatic Function Panel Recent Labs    08/10/17 2307  PROT 6.2*  ALBUMIN 3.3*  AST 25  ALT 25  ALKPHOS 52  BILITOT 0.7   Imaging: Dg Chest 2 View  Result Date: 08/10/2017 CLINICAL DATA:  Burning chest pain and tightness since 10:30 a.m. today. EXAM: CHEST - 2 VIEW COMPARISON:  10/06/2012. FINDINGS: The cardiac silhouette is mildly enlarged with a mild increase in size. Small bilateral calcified granulomata, greater on the right. Thoracic spine degenerative changes. IMPRESSION: No acute abnormality.  Mildly  progressive cardiomegaly. Electronically Signed   By: Claudie Revering M.D.   On: 08/10/2017 14:59    Cardiac Studies: EKG 08/11/2017: Normal sinus rhythm at rate of 62 bpm, left axis deviation, nonspecific T abnormality.  LVH.  EKG 08/10/2017: Normal sinus rhythm, left axis deviation, deep T wave inversion in 3 and aVF suggestive of inferior ischemia.  Compared to EKG performed by EMS at home, EKG changes are new.  Repeat EKG reveals resolution of T wave inversion  Telemetry: Normal sinus rhythm.  ECHO: Pending  Scheduled Meds: . aspirin EC  81 mg Oral Daily  . atorvastatin  10 mg Oral q1800  . metoprolol succinate  12.5 mg Oral Daily   Continuous Infusions: . heparin 850 Units/hr (08/10/17 1730)  .  nitroGLYCERIN 5 mcg/min (08/11/17 0337)   PRN Meds:.acetaminophen, fentaNYL (SUBLIMAZE) injection, nitroGLYCERIN, ondansetron (ZOFRAN) IV   Assessment/Plan:   Assessment/Plan 1.  Chest pain suggestive of unstable angina pectoris, dynamic EKG abnormalities as dictated above.  Recurrence of chest pain last night relieved with intravenous nitroglycerin.  Presently on IV heparin. 2.  Very mild hypertension, controlled. 3.  Hyperlipidemia, mild now on statins.  Recommendation: Due to recurrence of chest discomfort, I will cancel the Lexiscan Myoview stress test, I will set him up for cardiac catheterization in the morning.  All questions regarding cardiac catheterization has been explained to the patient and his daughter who is a physician.  Patient is willing to proceed.  Adrian Prows, M.D. 08/11/2017, 10:25 AM [facility] Pager: [phone] Office: [phone] If no answer: [phone]

## 2017-08-11 NOTE — Progress Notes (Signed)
Subjective:  He had an episode of chest discomfort earlier this morning and had to be started on IV nitroglycerin.  Chest tightness with radiation to the left arm, subsided with intravenous nitroglycerin.  He was scheduled for a stress test this morning which is canceled.  Presently remains asymptomatic. Objective:  Vital Signs in the last 24 hours: Temp:  [97.7 F (36.5 C)-98.7 F (37.1 C)] 98.7 F (37.1 C) (03/24 0456) Pulse Rate:  [55-97] 60 (03/24 0456) Resp:  [15-24] 20 (03/24 0510) BP: (88-145)/(50-78) 105/70 (03/24 0510) SpO2:  [93 %-99 %] 98 % (03/24 0456) Weight:  [69.9 kg (154 lb 1.6 oz)-71.2 kg (157 lb)] 69.9 kg (154 lb 1.6 oz) (03/23 1859)  Intake/Output from previous day: 03/23 0701 - 03/24 0700 In: 161.5 [P.O.:60; I.V.:101.5] Out: -   Physical Exam: Blood pressure 105/70, pulse 60, temperature 98.7 F (37.1 C), temperature source Oral, resp. rate 20, height 5\' 3"  (1.6 m), weight 69.9 kg (154 lb 1.6 oz), SpO2 98 %.  Constitutional: He is oriented to person, place, and time. He appears well-developed and well-nourished.  HENT:  Head: Atraumatic.  Eyes: Conjunctivae are normal.  Neck: Neck supple.  Cardiovascular: Normal rate, regular rhythm, normal heart sounds and intact distal pulses.  Distant heart sounds.  Normal vascular exam with intact pulses including carotids.  Respiratory: Effort normal and breath sounds normal.  Pectus excavatum noted  GI: Soft. Bowel sounds are normal.  obese  Musculoskeletal: Normal range of motion. He exhibits edema (2+ bilateral below knee pitting leg edema, pigmentation present).  Neurological: He is alert and oriented to person, place, and time.  Skin: Skin is warm and dry.  Psychiatric: He has a normal mood and affect.    Lab Results: BMP Recent Labs    08/10/17 1410 08/10/17 2307  NA 136 136  K 4.5 4.0  CL 102 105  CO2 25 22  GLUCOSE 94 83  BUN 20 22*  CREATININE 0.77 1.01  CALCIUM 8.9 8.7*  GFRNONAA >60 >60  GFRAA  >60 >60    CBC Recent Labs  Lab 08/10/17 1410 08/11/17 0249  WBC 4.7 6.2  RBC 4.16* 4.06*  HGB 13.5 13.2  HCT 40.1 38.5*  PLT 181 161  MCV 96.4 94.8  MCH 32.5 32.5  MCHC 33.7 34.3  RDW 13.4 12.9  LYMPHSABS 1.5  --   MONOABS 0.3  --   EOSABS 0.2  --   BASOSABS 0.0  --    Cardiac Panel (last 3 results) Recent Labs    08/10/17 1636 08/10/17 2307 08/11/17 0249  TROPONINI <0.03 <0.03 <0.03   TSH Recent Labs    08/10/17 1710  TSH 1.676    Lipid Panel     Component Value Date/Time   CHOL 180 08/10/2017 1703   TRIG 91 08/10/2017 1703   HDL 60 08/10/2017 1703   CHOLHDL 3.0 08/10/2017 1703   VLDL 18 08/10/2017 1703   LDLCALC 102 (H) 08/10/2017 1703     Hepatic Function Panel Recent Labs    08/10/17 2307  PROT 6.2*  ALBUMIN 3.3*  AST 25  ALT 25  ALKPHOS 52  BILITOT 0.7   Imaging: Dg Chest 2 View  Result Date: 08/10/2017 CLINICAL DATA:  Burning chest pain and tightness since 10:30 a.m. today. EXAM: CHEST - 2 VIEW COMPARISON:  10/06/2012. FINDINGS: The cardiac silhouette is mildly enlarged with a mild increase in size. Small bilateral calcified granulomata, greater on the right. Thoracic spine degenerative changes. IMPRESSION: No acute abnormality.  Mildly  progressive cardiomegaly. Electronically Signed   By: Claudie Revering M.D.   On: 08/10/2017 14:59    Cardiac Studies: EKG 08/11/2017: Normal sinus rhythm at rate of 62 bpm, left axis deviation, nonspecific T abnormality.  LVH.  EKG 08/10/2017: Normal sinus rhythm, left axis deviation, deep T wave inversion in 3 and aVF suggestive of inferior ischemia.  Compared to EKG performed by EMS at home, EKG changes are new.  Repeat EKG reveals resolution of T wave inversion  Telemetry: Normal sinus rhythm.  ECHO: Pending  Scheduled Meds: . aspirin EC  81 mg Oral Daily  . atorvastatin  10 mg Oral q1800  . metoprolol succinate  12.5 mg Oral Daily   Continuous Infusions: . heparin 850 Units/hr (08/10/17 1730)  .  nitroGLYCERIN 5 mcg/min (08/11/17 0337)   PRN Meds:.acetaminophen, fentaNYL (SUBLIMAZE) injection, nitroGLYCERIN, ondansetron (ZOFRAN) IV   Assessment/Plan:   Assessment/Plan 1.  Chest pain suggestive of unstable angina pectoris, dynamic EKG abnormalities as dictated above.  Recurrence of chest pain last night relieved with intravenous nitroglycerin.  Presently on IV heparin. 2.  Very mild hypertension, controlled. 3.  Hyperlipidemia, mild now on statins.  Recommendation: Due to recurrence of chest discomfort, I will cancel the Lexiscan Myoview stress test, I will set him up for cardiac catheterization in the morning.  All questions regarding cardiac catheterization has been explained to the patient and his daughter who is a physician.  Patient is willing to proceed.  Adrian Prows, M.D. 08/11/2017, 10:25 AM [facility] Pager: [phone] Office: [phone] If no answer: [phone]

## 2017-08-11 NOTE — Progress Notes (Signed)
ANTICOAGULATION CONSULT NOTE - Initial Consult  Pharmacy Consult for heparin Indication: chest pain/ACS  Patient Measurements: Height: 5\' 3"  (160 cm) Weight: 154 lb 1.6 oz (69.9 kg) IBW/kg (Calculated) : 56.9 Heparin Dosing Weight: 71.2 kg   Vital Signs: Temp: 98.6 F (37 C) (03/24 1300) Temp Source: Oral (03/24 1300) BP: 128/67 (03/24 1300) Pulse Rate: 82 (03/24 1300)  Labs: Recent Labs    08/10/17 1410 08/10/17 1636 08/10/17 2307 08/11/17 0249 08/11/17 0945 08/11/17 1937  HGB 13.5  --   --  13.2  --   --   HCT 40.1  --   --  38.5*  --   --   PLT 181  --   --  161  --   --   HEPARINUNFRC  --   --   --  0.34 0.26* 0.24*  CREATININE 0.77  --  1.01  --   --   --   TROPONINI  --  <0.03 <0.03 <0.03  --   --    Estimated Creatinine Clearance: 47 mL/min (by C-G formula based on SCr of 1.01 mg/dL).  Assessment: 49 yom presenting with acute chest pain from home (received 3 nitro and 325 mg aspirin by EMS). No anticoag PTA. CBC is wnl, no s/sx of bleeding noted.  heparin level trended down and is now subtherapeutic  Goal of Therapy:  Heparin level 0.3-0.7 units/ml Monitor platelets by anticoagulation protocol: Yes   Plan:  Bolus 1000 units x 1 Increase heparin to 1050 u/hr Daily HL, CBC, monitor for s/sx of bleeding  Levester Fresh, PharmD, BCPS, BCCCP Clinical Pharmacist Clinical phone for 08/11/2017 from 1430 [phone]: [phone] If after 2300, please call main pharmacy at: [phone] 08/11/2017 8:19 PM

## 2017-08-11 NOTE — Progress Notes (Signed)
Chest tightness returned and pt now C/O pain in shoulder blades and down arms bilaterally, NTG x 3 given and another EKG done, call placed to Dr Einar Gip and orders received, will continue to monitor.

## 2017-08-11 NOTE — Progress Notes (Signed)
Pt started C/O chest tightness, gave NTG x 3 S/L, removed the paste on his chest, placed him on O2 at 2L/min and got an EKG. Chest tightness subsided and pt is resting quietly at this time, will continue to monitor.

## 2017-08-11 NOTE — Progress Notes (Signed)
ANTICOAGULATION CONSULT NOTE - Follow Up Consult  Pharmacy Consult for heparin Indication: chest pain/ACS  Labs: Recent Labs    08/10/17 1410 08/10/17 1636 08/10/17 2307 08/11/17 0249  HGB 13.5  --   --  13.2  HCT 40.1  --   --  38.5*  PLT 181  --   --  161  HEPARINUNFRC  --   --   --  0.34  CREATININE 0.77  --  1.01  --   TROPONINI  --  <0.03 <0.03  --     Assessment/Plan:  84yo male therapeutic on heparin with initial dosing for CP. Will continue gtt at current rate and confirm stable with additional level.   Wynona Neat, PharmD, BCPS  08/11/2017,3:25 AM

## 2017-08-11 NOTE — Progress Notes (Signed)
ANTICOAGULATION CONSULT NOTE - Initial Consult  Pharmacy Consult for heparin Indication: chest pain/ACS  Patient Measurements: Height: 5\' 3"  (160 cm) Weight: 154 lb 1.6 oz (69.9 kg) IBW/kg (Calculated) : 56.9 Heparin Dosing Weight: 71.2 kg   Vital Signs: Temp: 98.7 F (37.1 C) (03/24 0456) Temp Source: Oral (03/24 0456) BP: 105/70 (03/24 0510) Pulse Rate: 60 (03/24 0456)  Labs: Recent Labs    08/10/17 1410 08/10/17 1636 08/10/17 2307 08/11/17 0249 08/11/17 0945  HGB 13.5  --   --  13.2  --   HCT 40.1  --   --  38.5*  --   PLT 181  --   --  161  --   HEPARINUNFRC  --   --   --  0.34 0.26*  CREATININE 0.77  --  1.01  --   --   TROPONINI  --  <0.03 <0.03 <0.03  --    Estimated Creatinine Clearance: 47 mL/min (by C-G formula based on SCr of 1.01 mg/dL).  Assessment: 54 yom presenting with acute chest pain from home (received 3 nitro and 325 mg aspirin by EMS). No anticoag PTA. CBC is wnl, no s/sx of bleeding noted.  Confirmatory heparin level trended down and is now subtherapeutic. Will increase drip by ~1u/kg/hr. Recheck level in 8hrs  Goal of Therapy:  Heparin level 0.3-0.7 units/ml Monitor platelets by anticoagulation protocol: Yes   Plan:  Increase heparin to 950 u/hr HL in 8 hours Daily HL, CBC, monitor for s/sx of bleeding   Patterson Hammersmith PharmD PGY1 Pharmacy Practice Resident 08/11/2017 12:00 PM Pager: [phone] Phone: [phone]

## 2017-08-12 ENCOUNTER — Inpatient Hospital Stay (HOSPITAL_COMMUNITY)
Admission: EM | Disposition: A | Discharge status: Home Health | Payer: Self-pay | Source: Home / Self Care | Attending: Cardiology

## 2017-08-12 ENCOUNTER — Other Ambulatory Visit: Payer: Self-pay

## 2017-08-12 ENCOUNTER — Encounter (HOSPITAL_COMMUNITY): Payer: Self-pay | Admitting: Cardiology

## 2017-08-12 ENCOUNTER — Inpatient Hospital Stay (HOSPITAL_COMMUNITY): Payer: Medicare HMO

## 2017-08-12 HISTORY — PX: LEFT HEART CATH AND CORONARY ANGIOGRAPHY: CATH118249

## 2017-08-12 HISTORY — PX: CORONARY STENT INTERVENTION: CATH118234

## 2017-08-12 HISTORY — PX: CORONARY ATHERECTOMY: CATH118238

## 2017-08-12 LAB — CBC
HCT: 41.4 % (ref 39.0–52.0)
HCT: 38.8 % — ABNORMAL LOW (ref 39.0–52.0)
Hemoglobin: 14.3 g/dL (ref 13.0–17.0)
Hemoglobin: 13.5 g/dL (ref 13.0–17.0)
MCH: 32.4 pg (ref 26.0–34.0)
MCH: 32.9 pg (ref 26.0–34.0)
MCHC: 34.5 g/dL (ref 30.0–36.0)
MCHC: 34.8 g/dL (ref 30.0–36.0)
MCV: 93.7 fL (ref 78.0–100.0)
MCV: 94.6 fL (ref 78.0–100.0)
PLATELETS: 184 10*3/uL (ref 150–400)
PLATELETS: 166 10*3/uL (ref 150–400)
RBC: 4.42 MIL/uL (ref 4.22–5.81)
RBC: 4.1 MIL/uL — ABNORMAL LOW (ref 4.22–5.81)
RDW: 13 % (ref 11.5–15.5)
RDW: 13.3 % (ref 11.5–15.5)
WBC: 7.7 10*3/uL (ref 4.0–10.5)
WBC: 7.7 10*3/uL (ref 4.0–10.5)

## 2017-08-12 LAB — POCT ACTIVATED CLOTTING TIME
Activated Clotting Time: 268 seconds
Activated Clotting Time: 301 seconds

## 2017-08-12 LAB — PROTIME-INR
INR: 1.1
Prothrombin Time: 14.1 seconds (ref 11.4–15.2)

## 2017-08-12 LAB — HEPARIN LEVEL (UNFRACTIONATED): Heparin Unfractionated: 0.49 IU/mL (ref 0.30–0.70)

## 2017-08-12 SURGERY — LEFT HEART CATH AND CORONARY ANGIOGRAPHY
Anesthesia: LOCAL

## 2017-08-12 MED ORDER — VERAPAMIL HCL 2.5 MG/ML IV SOLN
INTRAVENOUS | Status: AC
  Filled 2017-08-12: qty 2

## 2017-08-12 MED ORDER — IOPAMIDOL (ISOVUE-370) INJECTION 76%
INTRAVENOUS | Status: DC | PRN
  Administered 2017-08-12: 123 mL via INTRA_ARTERIAL

## 2017-08-12 MED ORDER — HEPARIN SODIUM (PORCINE) 1000 UNIT/ML IJ SOLN
INTRAMUSCULAR | Status: AC
  Filled 2017-08-12: qty 1

## 2017-08-12 MED ORDER — IOPAMIDOL (ISOVUE-370) INJECTION 76%
INTRAVENOUS | Status: AC
  Filled 2017-08-12: qty 50

## 2017-08-12 MED ORDER — MIDAZOLAM HCL 2 MG/2ML IJ SOLN
INTRAMUSCULAR | Status: DC | PRN
  Administered 2017-08-12: 1 mg via INTRAVENOUS

## 2017-08-12 MED ORDER — TICAGRELOR 90 MG PO TABS
90.0000 mg | ORAL_TABLET | Freq: Two times a day (BID) | ORAL | Status: DC
  Administered 2017-08-12 – 2017-08-13 (×2): 90 mg via ORAL
  Filled 2017-08-12 (×2): qty 1

## 2017-08-12 MED ORDER — MIDAZOLAM HCL 2 MG/2ML IJ SOLN
INTRAMUSCULAR | Status: AC
  Filled 2017-08-12: qty 2

## 2017-08-12 MED ORDER — NITROGLYCERIN 1 MG/10 ML FOR IR/CATH LAB
INTRA_ARTERIAL | Status: AC
  Filled 2017-08-12: qty 10

## 2017-08-12 MED ORDER — TICAGRELOR 90 MG PO TABS
ORAL_TABLET | ORAL | Status: DC | PRN
  Administered 2017-08-12: 180 mg via ORAL

## 2017-08-12 MED ORDER — LIDOCAINE HCL (PF) 1 % IJ SOLN
INTRAMUSCULAR | Status: DC | PRN
  Administered 2017-08-12: 2 mL

## 2017-08-12 MED ORDER — ANGIOPLASTY BOOK
Freq: Once | Status: AC
  Administered 2017-08-13: 07:00:00
  Filled 2017-08-12: qty 1

## 2017-08-12 MED ORDER — NITROGLYCERIN 1 MG/10 ML FOR IR/CATH LAB
INTRA_ARTERIAL | Status: DC | PRN
  Administered 2017-08-12: 200 ug
  Administered 2017-08-12: 150 ug via INTRACORONARY
  Administered 2017-08-12 (×2): 200 ug via INTRACORONARY

## 2017-08-12 MED ORDER — HYDRALAZINE HCL 20 MG/ML IJ SOLN: 5.0000 mg | INTRAMUSCULAR | Status: AC | PRN

## 2017-08-12 MED ORDER — HEPARIN (PORCINE) IN NACL 2-0.9 UNIT/ML-% IJ SOLN
INTRAMUSCULAR | Status: AC
  Filled 2017-08-12: qty 1000

## 2017-08-12 MED ORDER — IOPAMIDOL (ISOVUE-370) INJECTION 76%
INTRAVENOUS | Status: AC
  Filled 2017-08-12: qty 100

## 2017-08-12 MED ORDER — VERAPAMIL HCL 2.5 MG/ML IV SOLN
INTRAVENOUS | Status: DC | PRN
  Administered 2017-08-12: 08:00:00 via INTRA_ARTERIAL

## 2017-08-12 MED ORDER — LIDOCAINE HCL 1 % IJ SOLN
INTRAMUSCULAR | Status: AC
  Filled 2017-08-12: qty 20

## 2017-08-12 MED ORDER — SODIUM CHLORIDE 0.9 % IV SOLN: 250.0000 mL | INTRAVENOUS | Status: DC | PRN

## 2017-08-12 MED ORDER — HEPARIN (PORCINE) IN NACL 2-0.9 UNIT/ML-% IJ SOLN
INTRAMUSCULAR | Status: AC | PRN
  Administered 2017-08-12 (×2): 500 mL via INTRA_ARTERIAL

## 2017-08-12 MED ORDER — TICAGRELOR 90 MG PO TABS
ORAL_TABLET | ORAL | Status: AC
  Filled 2017-08-12: qty 2

## 2017-08-12 MED ORDER — SODIUM CHLORIDE 0.9% FLUSH: 3.0000 mL | INTRAVENOUS | Status: DC | PRN

## 2017-08-12 MED ORDER — SODIUM CHLORIDE 0.9 % WEIGHT BASED INFUSION: 1.0000 mL/kg/h | INTRAVENOUS | Status: AC

## 2017-08-12 MED ORDER — SODIUM CHLORIDE 0.9% FLUSH
3.0000 mL | Freq: Two times a day (BID) | INTRAVENOUS | Status: DC
  Administered 2017-08-12 – 2017-08-13 (×2): 3 mL via INTRAVENOUS

## 2017-08-12 MED ORDER — HEPARIN SODIUM (PORCINE) 1000 UNIT/ML IJ SOLN
INTRAMUSCULAR | Status: DC | PRN
  Administered 2017-08-12: 5000 [IU] via INTRAVENOUS
  Administered 2017-08-12: 3000 [IU] via INTRAVENOUS
  Administered 2017-08-12: 1000 [IU] via INTRAVENOUS

## 2017-08-12 SURGICAL SUPPLY — 26 items
BALLN MAVERICK OTW 3.5X15 (BALLOONS) ×2
BALLOON MAVERICK OTW 3.5X15 (BALLOONS) IMPLANT
BAND CMPR LRG ZPHR (HEMOSTASIS) ×1
BAND ZEPHYR COMPRESS 30 LONG (HEMOSTASIS) ×1 IMPLANT
CATH INFINITI 5 FR JL3.5 (CATHETERS) ×1 IMPLANT
CATH LAUNCHER 6FR AL1 (CATHETERS) IMPLANT
CATH OPTITORQUE TIG 4.0 5F (CATHETERS) ×1 IMPLANT
CATH VISTA GUIDE 6FR XBLAD3.5 (CATHETERS) ×1 IMPLANT
CATHETER LAUNCHER 6FR AL1 (CATHETERS) ×2
CROWN DIAMONDBACK CLASSIC 1.25 (BURR) ×1 IMPLANT
GUIDEWIRE INQWIRE 1.5J.035X260 (WIRE) IMPLANT
INQWIRE 1.5J .035X260CM (WIRE) ×2
KIT ENCORE 26 ADVANTAGE (KITS) ×1 IMPLANT
KIT HEMO VALVE WATCHDOG (MISCELLANEOUS) ×1 IMPLANT
KIT PV (KITS) ×1 IMPLANT
LUBRICANT VIPERSLIDE CORONARY (MISCELLANEOUS) ×1 IMPLANT
NDL PERC 21GX4CM (NEEDLE) IMPLANT
NEEDLE PERC 21GX4CM (NEEDLE) ×2 IMPLANT
SHEATH RAIN RADIAL 21G 6FR (SHEATH) ×1 IMPLANT
STENT SIERRA 3.50 X 23 MM (Permanent Stent) ×1 IMPLANT
TRANSDUCER W/STOPCOCK (MISCELLANEOUS) ×2 IMPLANT
TRAY PV CATH (CUSTOM PROCEDURE TRAY) ×1 IMPLANT
TUBING CIL FLEX 10 FLL-RA (TUBING) ×2 IMPLANT
WIRE HI TORQ BMW 300CM (WIRE) ×1 IMPLANT
WIRE HI TORQ VERSACORE-J 145CM (WIRE) ×1 IMPLANT
WIRE VIPER ADVANCE COR .012TIP (WIRE) ×1 IMPLANT

## 2017-08-12 NOTE — Care Management Note (Addendum)
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 Provided w Brilinta coupon,  [facility] [phone] on [address] of [address] is pharmacy of choice, verified it is in stock   Action/Plan:   Expected Discharge Date:                  Expected Discharge Plan:  Home/Self Care  In-House Referral:     Discharge planning Services  CM Consult  Post Acute Care Choice:    Choice offered to:     DME Arranged:    DME Agency:     HH Arranged:    [place] Agency:     Status of Service:  In process, will continue to follow  If discussed at Long Length of Stay Meetings, dates discussed:    Additional Comments:  Carles Collet, RN 08/12/2017, 2:16 PM

## 2017-08-12 NOTE — Progress Notes (Signed)
Found patient  On floor,  Supine   on floor mat, AAO X 4 , stated I thought I was in my kitchen when I woke up" . Denies pain nor discomforts, moves all extremities, ,\Vital signs stable.  iv tubing disconnected from port,with  blood on floor,   Gown changed and clean patient , Assisted back in bed , Dr Lora Havens notified , CBC requested. Patient's daughter Lyn Hollingshead notified..Monitored patient , high fall risk monitoring maintained.low bed alarm and mat in use.

## 2017-08-12 NOTE — Progress Notes (Signed)
ANTICOAGULATION CONSULT NOTE - Follow Up Consult  Pharmacy Consult for heparin Indication: USAP  Labs: Recent Labs    08/10/17 1410 08/10/17 1636 08/10/17 2307  08/11/17 0249 08/11/17 0945 08/11/17 1937 08/12/17 0419  HGB 13.5  --   --   --  13.2  --   --  14.3  HCT 40.1  --   --   --  38.5*  --   --  41.4  PLT 181  --   --   --  161  --   --  184  LABPROT  --   --   --   --   --   --   --  14.1  INR  --   --   --   --   --   --   --  1.10  HEPARINUNFRC  --   --   --    < > 0.34 0.26* 0.24* 0.49  CREATININE 0.77  --  1.01  --   --   --   --   --   TROPONINI  --  <0.03 <0.03  --  <0.03  --   --   --    < > = values in this interval not displayed.     Assessment/Plan:  84yo male therapeutic on heparin after rate changes. Will continue gtt at current rate and confirm stable with additional level.   Wynona Neat, PharmD, BCPS  08/12/2017,5:44 AM

## 2017-08-12 NOTE — Progress Notes (Signed)
5. S/W JAYLA @ [employer] [facility] # [phone] OPT- 2    BRILINTA 90 MG BID  COVER-YES  CO-PAY- $ 142.00  TIER- 3 DRUG  PRIOR APPROVAL- NO   DEDUCTIBLE : NOT MET   PREFERRED PHARMACY : YES - [facility] AND [facility]

## 2017-08-12 NOTE — Interval H&P Note (Signed)
History and Physical Interval Note:  08/12/2017 7:38 AM  Zaquan Hurley Cisco  has presented today for surgery, with the diagnosis of unstable angina  The various methods of treatment have been discussed with the patient and family. After consideration of risks, benefits and other options for treatment, the patient has consented to  Procedure(s): LEFT HEART CATH AND CORONARY ANGIOGRAPHY (N/A) and possible angioplasty as a surgical intervention .  The patient's history has been reviewed, patient examined, no change in status, stable for surgery.  I have reviewed the patient's chart and labs.  Questions were answered to the patient's satisfaction.   Cath Lab Visit (complete for each Cath Lab visit)  Clinical Evaluation Leading to the Procedure:   ACS: Yes.    Non-ACS:    Anginal Classification: CCS IV  Anti-ischemic medical therapy: Minimal Therapy (1 class of medications)  Non-Invasive Test Results: No non-invasive testing performed  Prior CABG: No previous CABG        Adrian Prows

## 2017-08-12 NOTE — Progress Notes (Signed)
  Echocardiogram 2D Echocardiogram has been performed.  Seth Mccall 08/12/2017, 2:20 PM

## 2017-08-12 NOTE — Progress Notes (Signed)
[facility] BAND REMOVAL  LOCATION:    right radial  DEFLATED PER PROTOCOL:    Yes.    TIME BAND OFF / DRESSING APPLIED:    1315   SITE UPON ARRIVAL:    Level 0  SITE AFTER BAND REMOVAL:    Level 0  CIRCULATION SENSATION AND MOVEMENT:    Within Normal Limits   Yes.    COMMENTS:   TOLERATED PROCEDURE WELL

## 2017-08-13 LAB — CBC
HEMATOCRIT: 37.3 % — AB (ref 39.0–52.0)
HEMOGLOBIN: 13.1 g/dL (ref 13.0–17.0)
MCH: 33.1 pg (ref 26.0–34.0)
MCHC: 35.1 g/dL (ref 30.0–36.0)
MCV: 94.2 fL (ref 78.0–100.0)
Platelets: 166 10*3/uL (ref 150–400)
RBC: 3.96 MIL/uL — ABNORMAL LOW (ref 4.22–5.81)
RDW: 13.3 % (ref 11.5–15.5)
WBC: 7.6 10*3/uL (ref 4.0–10.5)

## 2017-08-13 LAB — BASIC METABOLIC PANEL
ANION GAP: 8 (ref 5–15)
BUN: 18 mg/dL (ref 6–20)
CO2: 24 mmol/L (ref 22–32)
Calcium: 8.5 mg/dL — ABNORMAL LOW (ref 8.9–10.3)
Chloride: 106 mmol/L (ref 101–111)
Creatinine, Ser: 0.94 mg/dL (ref 0.61–1.24)
GFR calc Af Amer: >60 mL/min (ref >60)
GLUCOSE: 105 mg/dL — AB (ref 65–99)
POTASSIUM: 4.3 mmol/L (ref 3.5–5.1)
Sodium: 138 mmol/L (ref 135–145)

## 2017-08-13 LAB — ECHOCARDIOGRAM COMPLETE
HEIGHTINCHES: 63 in
WEIGHTICAEL: 2425.6 [oz_av]

## 2017-08-13 MED ORDER — ASPIRIN 81 MG PO TBEC: 81.0000 mg | DELAYED_RELEASE_TABLET | Freq: Every day | ORAL | Status: AC

## 2017-08-13 MED ORDER — METOPROLOL SUCCINATE ER 25 MG PO TB24: 12.5000 mg | ORAL_TABLET | Freq: Every day | ORAL | 1 refills | Status: DC

## 2017-08-13 MED ORDER — ATORVASTATIN CALCIUM 10 MG PO TABS: 10.0000 mg | ORAL_TABLET | Freq: Every day | ORAL | 1 refills | Status: AC

## 2017-08-13 MED ORDER — TICAGRELOR 90 MG PO TABS: 90.0000 mg | ORAL_TABLET | Freq: Two times a day (BID) | ORAL | 0 refills | Status: DC

## 2017-08-13 MED ORDER — NITROGLYCERIN 0.4 MG SL SUBL: 0.4000 mg | SUBLINGUAL_TABLET | SUBLINGUAL | 3 refills | Status: AC | PRN

## 2017-08-13 MED FILL — Lidocaine HCl Local Inj 1%: INTRAMUSCULAR | Qty: 20 | Status: AC

## 2017-08-13 MED FILL — Heparin Sodium (Porcine) 2 Unit/ML in Sodium Chloride 0.9%: INTRAMUSCULAR | Qty: 1000 | Status: AC

## 2017-08-13 NOTE — Progress Notes (Signed)
CARDIAC REHAB PHASE I   PRE:  Rate/Rhythm: 80 SR    BP: sitting 127/60    SaO2:   MODE:  Ambulation: 2 steps to recliner   POST:  Rate/Rhythm: 85 SR  Pt quite stiff and weak today. Needed mod-max assist to get to EOB. He could tell his right leg was weak so he asked to go to recliner first. Slow to stand, upon standing pt struggled to move his legs and was tempted to dive for recliner. He did take 2 steps shuffling to side to reach the recliner. I used gait belt for assist. He normally uses his cane in the house, the shopping cart in small stores and a scooter in large stores. He aides his wife at home. He needs PT, imminent d/c order. I am unsure if he can go home today, he might be stronger after sitting up and moving around more. Ed completed with pt with good understanding. He understands importance of Brilinta/ASA. Will refer to [facility] as pt is interested. Left diet sheet. Daughter not available right now. Pt probably needs RW. [phone]  [place], ACSM 08/13/2017 9:41 AM

## 2017-08-13 NOTE — Evaluation (Signed)
Physical Therapy Evaluation Patient Details Name: Seth Mccall MRN: [id number] DOB: [birth year] Today's Date: 08/13/2017   History of Present Illness  Pt is an 84 y/o male admitted secondary to increased chest tightness. Pt is s/p L heart cath on 3/25. PMH includes gout, HTN, prostate CA, and back surgery.   Clinical Impression  Pt admitted secondary to problem above with deficits below. PTA, pt was very independent and using cane for ambulation; was the primary caregiver for his wife. Upon eval, pt presenting with weakness (RLE>LLE) and R knee pain and stiffness. Required mod to max A for mobility with RW and only able to perform side steps at EOB secondary to increased unsteadiness. Feel pt is a high fall risk, as pt unsteady and had fall during hospital stay. Spoke with pt's daughter over phone and was asking about taking pt home. Feel SNF is safest d/c disposition, however, if pt decides to go home will need HHPT and 24/7 assist/supervision. Will continue to follow acutely to maximize functional mobility independence and safety.     Follow Up Recommendations SNF;Supervision/Assistance - 24 hour    Equipment Recommendations  Rolling walker with 5" wheels;3in1 (PT)    Recommendations for Other Services OT consult     Precautions / Restrictions Precautions Precautions: Fall Precaution Comments: Had fall in hospital room on 3/26 Restrictions Weight Bearing Restrictions: Yes RUE Weight Bearing: Partial weight bearing RUE Partial Weight Bearing Percentage or Pounds: (<5 lbs) Other Position/Activity Restrictions: Cath precautions       Mobility  Bed Mobility Overal bed mobility: Needs Assistance Bed Mobility: Supine to Sit;Sit to Supine     Supine to sit: Mod assist Sit to supine: Mod assist   General bed mobility comments: Mod A for trunk elevation and for LE assist. Also required assist to scoot to EOB using Bed pad.   Transfers Overall transfer level: Needs  assistance Equipment used: Rolling walker (2 wheeled) Transfers: Sit to/from Stand Sit to Stand: Max assist         General transfer comment: MAx A for lift assist and steadying to stand. Verbal cues for safe hand placement.   Ambulation/Gait Ambulation/Gait assistance: Mod assist;Min assist Ambulation Distance (Feet): 1 Feet Assistive device: Rolling walker (2 wheeled)       General Gait Details: Side stepped along the EOB, as pt very unsteady with gait requiring min to mod A for steadying. Posterior LOB noted when taking side steps. Did not feel comfortable taking steps away from EOB without 2nd person.   Stairs            Wheelchair Mobility    Modified Rankin (Stroke Patients Only)       Balance Overall balance assessment: Needs assistance Sitting-balance support: No upper extremity supported;Feet supported Sitting balance-Leahy Scale: Fair   Postural control: Posterior lean Standing balance support: Bilateral upper extremity supported;During functional activity Standing balance-Leahy Scale: Poor Standing balance comment: Reliant on BUE support and external support to stand.                              Pertinent Vitals/Pain Pain Assessment: Faces Faces Pain Scale: Hurts little more Pain Location: R knee  Pain Descriptors / Indicators: Aching(stiffness ) Pain Intervention(s): Limited activity within patient's tolerance;Monitored during session;Repositioned    Home Living Family/patient expects to be discharged to:: Private residence Living Arrangements: Spouse/significant other Available Help at Discharge: Family;Available 24 hours/day(primary caregiver for wife ) Type of Home: House  Home Access: Level entry     Home Layout: One level Home Equipment: Cane - single point;Shower seat;Walker - 4 wheels      Prior Function Level of Independence: Independent with assistive device(s)         Comments: Used cane for ambulation       Hand Dominance   Dominant Hand: Right    Extremity/Trunk Assessment   Upper Extremity Assessment Upper Extremity Assessment: Defer to OT evaluation    Lower Extremity Assessment Lower Extremity Assessment: RLE deficits/detail RLE Deficits / Details: RLE leg length discrepancy at baseline. Uses elevated shoe. Reports increased R knee and ankle pain.     Cervical / Trunk Assessment Cervical / Trunk Assessment: Kyphotic  Communication   Communication: No difficulties  Cognition Arousal/Alertness: Awake/alert Behavior During Therapy: WFL for tasks assessed/performed Overall Cognitive Status: Within Functional Limits for tasks assessed                                        General Comments General comments (skin integrity, edema, etc.): Spoke with pt's daughter on the phone about mobility concerns and concerns about safety at home. Educated about recommendations for SNF and need for 24/7 support. However, pt's daughter asking if pt would be ok at home with 24/7 support and HHPT. Educated that I could not confirm/deny if pt would be ok at home, however, did reiterate safety concerns and recommendations for SNF.     Exercises General Exercises - Lower Extremity Hip Flexion/Marching: AROM;Both;10 reps;Standing(using RW; min A ) Other Exercises Other Exercises: Reviewed supine HEP including heel slides and ankle pumps to help ease soreness and stiffness.    Assessment/Plan    PT Assessment Patient needs continued PT services  PT Problem List Decreased strength;Decreased balance;Decreased mobility;Decreased knowledge of use of DME;Decreased knowledge of precautions;Pain       PT Treatment Interventions DME instruction;Gait training;Functional mobility training;Therapeutic activities;Therapeutic exercise;Balance training;Neuromuscular re-education;Patient/family education    PT Goals (Current goals can be found in the Care Plan section)  Acute Rehab PT  Goals Patient Stated Goal: to be able to walk better  PT Goal Formulation: With patient Time For Goal Achievement: 08/27/17 Potential to Achieve Goals: Good    Frequency Min 3X/week   Barriers to discharge Decreased caregiver support Pt is primary caregiver for wife     Co-evaluation               AM-PAC PT "6 Clicks" Daily Activity  Outcome Measure Difficulty turning over in bed (including adjusting bedclothes, sheets and blankets)?: A Lot Difficulty moving from lying on back to sitting on the side of the bed? : Unable Difficulty sitting down on and standing up from a chair with arms (e.g., wheelchair, bedside commode, etc,.)?: Unable Help needed moving to and from a bed to chair (including a wheelchair)?: A Lot Help needed walking in hospital room?: A Lot Help needed climbing 3-5 steps with a railing? : A Lot 6 Click Score: 10    End of Session Equipment Utilized During Treatment: Gait belt Activity Tolerance: Patient tolerated treatment well Patient left: in bed;with call bell/phone within reach;with bed alarm set Nurse Communication: Mobility status PT Visit Diagnosis: Unsteadiness on feet (R26.81);Other abnormalities of gait and mobility (R26.89);Pain;History of falling (Z91.81) Pain - Right/Left: Right Pain - part of body: Knee    Time: 9528-4132 PT Time Calculation (min) (ACUTE ONLY): 28 min  Charges:   PT Evaluation $PT Eval Low Complexity: 1 Low PT Treatments $Therapeutic Activity: 8-22 mins   PT G Codes:        Leighton Ruff, PT, DPT  Acute Rehabilitation Services  Pager: [phone]   Rudean Hitt 08/13/2017, 12:59 PM

## 2017-08-13 NOTE — Care Management Note (Signed)
Case Management Note  Patient Details  Name: Seth Mccall MRN: [id number] Date of Birth: 1929-11-22  Subjective/Objective:                 Patient daughter would like to take patient home and provide 24 hour care with [facility] through [facility]. Declined offer for SNF. Patient has DME RW and 3/1 at home already, no other needs for additional DME verbalized. Paged MD for [facility] orders, referral accepted by [facility].    Action/Plan:   Expected Discharge Date:  08/13/17               Expected Discharge Plan:  [place]  In-House Referral:     Discharge planning Services  CM Consult  Post Acute Care Choice:  Home Health Choice offered to:  Adult Children  DME Arranged:    DME Agency:     HH Arranged:  PT HH Agency:  [place]  Status of Service:  Completed, signed off  If discussed at [place] of Stay Meetings, dates discussed:    Additional Comments:  Carles Collet, RN 08/13/2017, 3:44 PM

## 2017-08-13 NOTE — Discharge Summary (Signed)
Physician Discharge Summary  Patient ID: Seth Mccall MRN: [id number] DOB/AGE: 1930-04-25 84 y.o.  Admit date: 08/10/2017 Discharge date: 08/13/2017  Primary Discharge Diagnosis Unstable angina pectoris CAD native vessel with unstable angina S/P atherectomy followed by 3/5x23 mm Xience Sierra DES implantation 08/11/2017   Secondary Discharge Diagnosis Gait instability due to spinal stenosis, history of fall in the hospital without any injury Hypertension, essential Mild hyperlipidemia  Significant Diagnostic Studies:  EKG 08/11/2017: Normal sinus rhythm at rate of 62 bpm, left axis deviation, nonspecific T abnormality.  LVH.  EKG 08/10/2017: Normal sinus rhythm, left axis deviation, deep T wave inversion in 3 and aVF suggestive of inferior ischemia. Compared to EKG performed by EMS at home, EKG changes are new. Repeat EKG reveals resolution of T wave inversion  Telemetry: Normal sinus rhythm.  Echocardiogram 08/12/2017: Normal LV systolic function, EF 93-57%.  No significant valvular abnormality.  PA pressure up her limit of normal.  Coronary angiogram 08/12/2017: Mild disease in the RCA, proximal 30%.  Left coronary severe calcification evident in the left main and also in the proximal circumflex and LAD.  Proximal LAD 90% calcific high-grade stenosis S/P diamondback atherectomy and stenting with PTCA and atherectomy followed by stenting of the Proximal LAD with 3.5x23 mm Sierra DES.  90% stenosis reduced to 0% with TIMI-3 to TIMI-3 flow maintained at the end of the procedure.  Low normal LVEF at 55%.  Hospital Course: He was admitted to the hospital with acute onset of chest pain, had dynamic EKG abnormality with deep T wave inversion in the inferior lead which resolved however had recurrent episodes of chest pain needing starting IV nitroglycerin relieved the chest tightness.  Due to ongoing chest discomfort and symptoms suggestive of unstable angina, he was admitted to  the hospital over the weekend and the following Monday he underwent coronary angiography on 08/11/2017 revealing high-grade, calcific 90% stenosis of proximal LAD for which she underwent CSI diamondback atherectomy followed by stenting with excellent results.  The following morning he did walk with cardiac rehabilitation with great amount of difficulty.  Due to gait instability, it was recommended that he have home health along with physical therapy.  Patient did not want to be in ECF.  Hence home health was arranged.  He was felt stable for discharge.  Recommendations on discharge: he will need dual antiplatelet therapy with aspirin improvement of her at least one year. Continue low-dose statins. Due to borderline low heart rate and also low blood pressure, he was started on very low-dose of beta blocke  Discharge Exam: Blood pressure (!) 114/50, pulse 65, temperature 97.8 F (36.6 C), temperature source Oral, resp. rate 19, height 5\' 3"  (1.6 m), weight 69 kg (152 lb 1.9 oz), SpO2 98 %.    General appearance: alert, cooperative, appears stated age and no distress Resp: clear to auscultation bilaterally Chest wall: no tenderness Cardio: regular rate and rhythm, S1, S2 normal, no murmur, click, rub or gallop GI: soft, non-tender; bowel sounds normal; no masses,  no organomegaly Extremities: extremities normal, atraumatic, no cyanosis or edema Neurologic: Grossly normal Incision/Wound: Right radial access has healed well.  Labs:   Lab Results  Component Value Date   WBC 7.6 08/13/2017   HGB 13.1 08/13/2017   HCT 37.3 (L) 08/13/2017   MCV 94.2 08/13/2017   PLT 166 08/13/2017    Recent Labs  Lab 08/10/17 2307 08/13/17 0409  NA 136 138  K 4.0 4.3  CL 105 106  CO2 22 24  BUN 22* 18  CREATININE 1.01 0.94  CALCIUM 8.7* 8.5*  PROT 6.2*  --   BILITOT 0.7  --   ALKPHOS 52  --   ALT 25  --   AST 25  --   GLUCOSE 83 105*    Lipid Panel     Component Value Date/Time   CHOL 180  08/10/2017 1703   TRIG 91 08/10/2017 1703   HDL 60 08/10/2017 1703   CHOLHDL 3.0 08/10/2017 1703   VLDL 18 08/10/2017 1703   LDLCALC 102 (H) 08/10/2017 1703  Cardiac Panel (last 3 results) Recent Labs    08/10/17 1636 08/10/17 2307 08/11/17 0249  TROPONINI <0.03 <0.03 <0.03    Lab Results  Component Value Date   TROPONINI <0.03 08/11/2017     TSH Recent Labs    08/10/17 1710  TSH 1.676   Radiology: Dg Chest 2 View  Result Date: 08/10/2017 CLINICAL DATA:  Burning chest pain and tightness since 10:30 a.m. today. EXAM: CHEST - 2 VIEW COMPARISON:  10/06/2012. FINDINGS: The cardiac silhouette is mildly enlarged with a mild increase in size. Small bilateral calcified granulomata, greater on the right. Thoracic spine degenerative changes. IMPRESSION: No acute abnormality.  Mildly progressive cardiomegaly. Electronically Signed   By: Claudie Revering M.D.   On: 08/10/2017 14:59   FOLLOW UP PLANS AND APPOINTMENTS Discharge Instructions    AMB Referral to Cardiac Rehabilitation - Phase II   Complete by:  As directed    Diagnosis:  Coronary Stents     Allergies as of 08/13/2017   No Known Allergies     Medication List    TAKE these medications   aspirin 81 MG EC tablet Take 1 tablet (81 mg total) by mouth daily.   atorvastatin 10 MG tablet Commonly known as:  LIPITOR Take 1 tablet (10 mg total) by mouth daily at 6 PM.   colchicine 0.6 MG tablet Take 0.6 mg by mouth daily as needed. Reported on 06/02/2015   lisinopril 5 MG tablet Commonly known as:  PRINIVIL,ZESTRIL Take 5 mg by mouth every morning. Take with a 10 mg tablet to make 15 mg   metoprolol succinate 25 MG 24 hr tablet Commonly known as:  TOPROL-XL Take 0.5 tablets (12.5 mg total) by mouth daily.   nitroGLYCERIN 0.4 MG SL tablet Commonly known as:  NITROSTAT Place 1 tablet (0.4 mg total) under the tongue every 5 (five) minutes x 3 doses as needed for chest pain.   ticagrelor 90 MG Tabs tablet Commonly known  as:  BRILINTA Take 1 tablet (90 mg total) by mouth 2 (two) times daily.      Follow-up Information    Adrian Prows, MD. Go on 08/22/2017.   Specialty:  Cardiology Why:  Come at 1 pm for 1:30 appointment. Bring all medications Contact information: [address] [place] [address] [phone]          Adrian Prows, MD 08/13/2017, 9:01 AM  Pager: [phone] Office: [phone] If no answer: [phone]

## 2017-08-13 NOTE — Clinical Social Work Note (Signed)
CSW talked with patient's daughter Rip Harbour [phone]) regarding discharge disposition: Home with [facility] versus SNF. Daughter informed CSW that her dad lives at home and prior to hospitalization used a cane to walk and was very independent and active. She reported that her dad is very determined and motivated and she feels that rigorous therapy will help him in getting stronger and back to his baseline. She indicated that they do not want SNF and she has a 24/7 caregiver for her dad and asked about outpatient PT. Daughter advised that nurse case manager will be contacted and will contact her. Call made to nurse case manager Carles Collet regarding daughter's request and she will contact daughter regarding [facility] services. CSW signing off as patient will discharge home with [facility] services today.  Kiel Cockerell Givens, MSW, LCSW Licensed Clinical Social Worker [place] [phone]

## 2017-08-14 ENCOUNTER — Telehealth (HOSPITAL_COMMUNITY): Payer: Self-pay

## 2017-08-14 NOTE — Telephone Encounter (Signed)
Patients insurance is active and benefits verified through [employer] - $45.00 co-pay, no deductible, out of pocket amount of $4,200/$0.00 has been met, no co-insurance, and no pre-authorization is required. Passport/reference #[id number] ° °Patient will be contacted for scheduling upon review by the RN Navigator. °

## 2017-08-21 ENCOUNTER — Other Ambulatory Visit: Payer: Self-pay

## 2017-08-21 ENCOUNTER — Emergency Department (HOSPITAL_COMMUNITY): Payer: Medicare HMO

## 2017-08-21 ENCOUNTER — Emergency Department (HOSPITAL_COMMUNITY)
Admission: EM | Admit: 2017-08-21 | Discharge: 2017-08-21 | Disposition: A | Discharge status: Home / Self Care | Payer: Medicare HMO | Attending: Emergency Medicine | Admitting: Emergency Medicine

## 2017-08-21 ENCOUNTER — Encounter (HOSPITAL_COMMUNITY): Payer: Self-pay | Admitting: Emergency Medicine

## 2017-08-21 DIAGNOSIS — R072 Precordial pain: Secondary | ICD-10-CM | POA: Diagnosis not present

## 2017-08-21 DIAGNOSIS — Z79899 Other long term (current) drug therapy: Secondary | ICD-10-CM | POA: Diagnosis not present

## 2017-08-21 DIAGNOSIS — K219 Gastro-esophageal reflux disease without esophagitis: Secondary | ICD-10-CM

## 2017-08-21 DIAGNOSIS — R079 Chest pain, unspecified: Secondary | ICD-10-CM

## 2017-08-21 DIAGNOSIS — I10 Essential (primary) hypertension: Secondary | ICD-10-CM | POA: Diagnosis not present

## 2017-08-21 DIAGNOSIS — Z7982 Long term (current) use of aspirin: Secondary | ICD-10-CM | POA: Insufficient documentation

## 2017-08-21 DIAGNOSIS — Z87891 Personal history of nicotine dependence: Secondary | ICD-10-CM | POA: Diagnosis not present

## 2017-08-21 LAB — I-STAT TROPONIN, ED
TROPONIN I, POC: 0 ng/mL (ref 0.00–0.08)
TROPONIN I, POC: 0 ng/mL (ref 0.00–0.08)
Troponin i, poc: 0.03 ng/mL (ref 0.00–0.08)

## 2017-08-21 LAB — BASIC METABOLIC PANEL
Anion gap: 8 (ref 5–15)
BUN: 21 mg/dL — AB (ref 6–20)
CALCIUM: 8.7 mg/dL — AB (ref 8.9–10.3)
CHLORIDE: 103 mmol/L (ref 101–111)
CO2: 25 mmol/L (ref 22–32)
CREATININE: 0.8 mg/dL (ref 0.61–1.24)
GFR calc Af Amer: >60 mL/min (ref >60)
GFR calc non Af Amer: >60 mL/min (ref >60)
Glucose, Bld: 94 mg/dL (ref 65–99)
Potassium: 4.3 mmol/L (ref 3.5–5.1)
SODIUM: 136 mmol/L (ref 135–145)

## 2017-08-21 LAB — CBC
HCT: 36.4 % — ABNORMAL LOW (ref 39.0–52.0)
Hemoglobin: 12 g/dL — ABNORMAL LOW (ref 13.0–17.0)
MCH: 31.6 pg (ref 26.0–34.0)
MCHC: 33 g/dL (ref 30.0–36.0)
MCV: 95.8 fL (ref 78.0–100.0)
Platelets: 220 10*3/uL (ref 150–400)
RBC: 3.8 MIL/uL — ABNORMAL LOW (ref 4.22–5.81)
RDW: 13.4 % (ref 11.5–15.5)
WBC: 6.1 10*3/uL (ref 4.0–10.5)

## 2017-08-21 MED ORDER — PANTOPRAZOLE SODIUM 40 MG PO TBEC: 40.0000 mg | DELAYED_RELEASE_TABLET | Freq: Every day | ORAL | 0 refills | Status: DC

## 2017-08-21 MED ORDER — NITROGLYCERIN 0.4 MG SL SUBL
0.4000 mg | SUBLINGUAL_TABLET | SUBLINGUAL | Status: DC | PRN
  Administered 2017-08-21: 0.4 mg via SUBLINGUAL
  Filled 2017-08-21: qty 1

## 2017-08-21 MED ORDER — PANTOPRAZOLE SODIUM 40 MG IV SOLR
40.0000 mg | Freq: Once | INTRAVENOUS | Status: AC
  Administered 2017-08-21: 40 mg via INTRAVENOUS
  Filled 2017-08-21: qty 40

## 2017-08-21 NOTE — Discharge Instructions (Signed)
You were evaluated in the emergency department for upper abdominal and chest pain.  There was no evidence of any chest injury during this evaluation.  Your cardiologist Dr. Einar Gip saw you in the department to and he felt comfortable letting you go home and will see you tomorrow in the office.  He is asking [place] to prescribe you some acid medication and we are giving her prescription.  Please take your regular medications as prescribed.  Return if any problems.

## 2017-08-21 NOTE — ED Provider Notes (Signed)
[name] EMERGENCY DEPARTMENT Provider Note  CSN: [id number] Arrival date & time: 08/21/17 1243  Chief Complaint(s) Chest Pain  HPI Seth Mccall is a 84 y.o. male with a history of unstable angina status post DES to LAD on March 23 who presents to the emergency department for substernal chest pain similar to his prior chest pains.  States that the pain began 3 hours prior to arrival and has been constant since onset.  Nonradiating.  Not exertional.  Associated with mild shortness of breath.  No nausea or diaphoresis.  No associated fevers or cough.  No vomiting or abdominal pain.  Denies any other associated symptoms.  The history is provided by the patient and a relative.    Past Medical History Past Medical History:  Diagnosis Date  . Arthritis   . Complication of anesthesia    limited movement in neck because of fusion  . Gout   . Hypertension   . Prostate cancer [facility])    Patient Active Problem List   Diagnosis Date Noted  . Unstable angina ([place]) 08/10/2017  . Candida infection 08/30/2015  . Malignant neoplasm of prostate ([place]) 04/28/2015  . BPH (benign prostatic hypertrophy) with urinary obstruction 10/09/2012   Home Medication(s) Prior to Admission medications   Medication Sig Start Date End Date Taking? Authorizing Provider  aspirin EC 81 MG EC tablet Take 1 tablet (81 mg total) by mouth daily. 08/13/17  Yes Adrian Prows, MD  atorvastatin (LIPITOR) 10 MG tablet Take 1 tablet (10 mg total) by mouth daily at 6 PM. 08/13/17  Yes Adrian Prows, MD  colchicine 0.6 MG tablet Take 0.6 mg by mouth daily as needed. Reported on 06/02/2015   Yes [provider]  lisinopril (PRINIVIL,ZESTRIL) 5 MG tablet Take 5 mg by mouth every morning. Take with a 10 mg tablet to make 15 mg   Yes [provider]  metoprolol succinate (TOPROL-XL) 25 MG 24 hr tablet Take 0.5 tablets (12.5 mg total) by mouth daily. 08/13/17  Yes Adrian Prows, MD  nitroGLYCERIN  (NITROSTAT) 0.4 MG SL tablet Place 1 tablet (0.4 mg total) under the tongue every 5 (five) minutes x 3 doses as needed for chest pain. 08/13/17  Yes Adrian Prows, MD  ticagrelor (BRILINTA) 90 MG TABS tablet Take 1 tablet (90 mg total) by mouth 2 (two) times daily. 08/13/17  Yes Adrian Prows, MD                                                                                                                                    Past Surgical History Past Surgical History:  Procedure Laterality Date  . BACK SURGERY  2005   fusion of C3, C4, and C5  . CATARACT EXTRACTION Bilateral   . CORONARY ATHERECTOMY N/A 08/12/2017   Procedure: CORONARY ATHERECTOMY;  Surgeon: Adrian Prows, MD;  Location: [place] CV LAB;  Service: Cardiovascular;  Laterality: N/A;  .  CORONARY STENT INTERVENTION N/A 08/12/2017   Procedure: CORONARY STENT INTERVENTION;  Surgeon: Adrian Prows, MD;  Location: [place] CV LAB;  Service: Cardiovascular;  Laterality: N/A;  . GREEN LIGHT LASER TURP (TRANSURETHRAL RESECTION OF PROSTATE N/A 10/10/2012   Procedure: GREEN LIGHT LASER TURP (TRANSURETHRAL RESECTION OF PROSTATE;  Surgeon: Claybon Jabs, MD;  Location: [facility];  Service: Urology;  Laterality: N/A;  . LEFT HEART CATH AND CORONARY ANGIOGRAPHY N/A 08/12/2017   Procedure: LEFT HEART CATH AND CORONARY ANGIOGRAPHY;  Surgeon: Adrian Prows, MD;  Location: [facility] CV [facility];  Service: Cardiovascular;  Laterality: N/A;  . PROSTATE BIOPSY    . TRANSURETHRAL RESECTION OF PROSTATE  10-10-2012   Family History History reviewed. No pertinent family history.  Social History Social History   Tobacco Use  . Smoking status: Former Smoker    Packs/day: 0.50    Years: 4.00    Pack years: 2.00    Types: Cigarettes    Last attempt to quit: 05/21/1968    Years since quitting: 49.2  . Smokeless tobacco: Never Used  Substance Use Topics  . Alcohol use: Yes    Comment: occasional  . Drug use: No   Allergies Patient has no known allergies.  Review  of Systems Review of Systems All other systems are reviewed and are negative for acute change except as noted in the HPI  Physical Exam Vital Signs  I have reviewed the triage vital signs BP 127/70   Pulse (!) 57   Resp 19   Ht 5\' 3"  (1.6 m)   Wt 68.9 kg (152 lb)   SpO2 100%   BMI 26.93 kg/m   Physical Exam  Constitutional: He is oriented to person, place, and time. He appears well-developed and well-nourished. No distress.  HENT:  Head: Normocephalic and atraumatic.  Nose: Nose normal.  Eyes: Pupils are equal, round, and reactive to light. Conjunctivae and EOM are normal. Right eye exhibits no discharge. Left eye exhibits no discharge. No scleral icterus.  Neck: Normal range of motion. Neck supple.  Cardiovascular: Normal rate and regular rhythm. Exam reveals no gallop and no friction rub.  No murmur heard. Pulmonary/Chest: Effort normal and breath sounds normal. No stridor. No respiratory distress. He has no rales.  Abdominal: Soft. He exhibits no distension. There is no tenderness.  Musculoskeletal: He exhibits no edema or tenderness.  Neurological: He is alert and oriented to person, place, and time.  Skin: Skin is warm and dry. No rash noted. He is not diaphoretic. No erythema.  Psychiatric: He has a normal mood and affect.  Vitals reviewed.   ED Results and Treatments Labs (all labs ordered are listed, but only abnormal results are displayed) Labs Reviewed  BASIC METABOLIC PANEL - Abnormal; Notable for the following components:      Result Value   BUN 21 (*)    Calcium 8.7 (*)    All other components within normal limits  CBC - Abnormal; Notable for the following components:   RBC 3.80 (*)    Hemoglobin 12.0 (*)    HCT 36.4 (*)    All other components within normal limits  I-STAT TROPONIN, ED  I-STAT TROPONIN, ED  EKG  EKG  Interpretation  Date/Time:  Wednesday August 21 2017 13:28:09 EDT Ventricular Rate:  55 PR Interval:    QRS Duration: 113 QT Interval:  410 QTC Calculation: 393 R Axis:   -32 Text Interpretation:  Sinus rhythm Short PR interval Left ventricular hypertrophy Inferior infarct, old Otherwise no significant change Confirmed by Addison Lank [phone]) on 08/21/2017 1:33:03 PM      Radiology Dg Chest 2 View  Result Date: 08/21/2017 CLINICAL DATA:  Bilateral chest pain with abdominal swelling and pain. Recent angioplasty. EXAM: CHEST - 2 VIEW COMPARISON:  08/10/2017 FINDINGS: Artifact overlies the chest. Heart size is normal. Mediastinal shadows are normal. The lungs are clear. No effusions. No significant bone finding. IMPRESSION: No active cardiopulmonary disease. Electronically Signed   By: Nelson Chimes M.D.   On: 08/21/2017 14:47   Pertinent labs & imaging results that were available during my care of the patient were reviewed by me and considered in my medical decision making (see chart for details).  Medications Ordered in ED Medications  nitroGLYCERIN (NITROSTAT) SL tablet 0.4 mg (0.4 mg Sublingual Given 08/21/17 1551)  pantoprazole (PROTONIX) injection 40 mg (40 mg Intravenous Given 08/21/17 1453)                                                                                                                                    Procedures Procedures  (including critical care time)  Medical Decision Making / ED Course I have reviewed the nursing notes for this encounter and the patient's prior records (if available in EHR or on provided paperwork).  Clinical Course as of Aug 21 1632  Wed Aug 21, 2017  1425 Concern for possible restenosis versus Side effect from Brilinta versus GI symptoms. EKG without acute ischemic changes or evidence of pericarditis.  Initial troponin negariv low pretest probability for pulmonary embolism.  Reason patient not classic for aortic dissection or esophageal  perforation.  Currently still pending chest x-ray.  Discussed case with Dr. Einar Gip who agreed with obtaining a second troponin.  He requested that we provide the patient with Protonix and caffeine.  He will evaluate the patient following the delta troponin.     [PC]  8416 Chest x-ray without evidence suggestive of pneumonia, pneumothorax, pneumomediastinum.  No abnormal contour of the mediastinum to suggest dissection. No evidence of acute injuries.    [PC]    Clinical Course User Index [PC] Lora Chavers, Grayce Sessions, MD   Patient care turned over to Dr Melina Copa at 1600. Patient case and results discussed in detail; please see their note for further ED managment.      Final Clinical Impression(s) / ED Diagnoses Final diagnoses:  Precordial pain      This chart was dictated using voice recognition software.  Despite best efforts to proofread,  errors can occur which can change the documentation meaning.   Fatima Blank, MD 08/21/17 [phone]

## 2017-08-21 NOTE — H&P (Signed)
CARDIOLOGY CONSULT NOTE  Patient ID: Seth Mccall MRN: [id number] DOB/AGE: 1930/03/13 84 y.o.  Admit date: 08/21/2017  Primary Physician:  [name], MD Reason for Consultation  Chest pain  HPI: Seth Mccall  is a 84 y.o. male  With Patient was recently admitted on 08/10/2017 and discharged on 08/13/2017 with unstable angina pectoris, underwent successful angioplasty to proximal LAD with implantation of a drug-eluting stent after performing atherectomy.  He is now presenting to the emergency room, started noticing severe burning sensation in his abdomen and also partly in the lower part of his chest.  He took 2 sublingual nitroglycerin without any relief.  In the emergency room he received nitroglycerin again with no relief.  EKG in the field was normal.  On further questioning, he denies chest tightness that he had when he initially presented with unstable angina pectoris.  He has not noticed any dyspnea, no PND or orthopnea or leg edema.  No dark stools.  He has been tolerating Brilinta and aspirin without any side effects.  Past Medical History:  Diagnosis Date  . Arthritis   . Complication of anesthesia    limited movement in neck because of fusion  . Gout   . Hypertension   . Prostate cancer [facility])      Past Surgical History:  Procedure Laterality Date  . BACK SURGERY  2005   fusion of C3, C4, and C5  . CATARACT EXTRACTION Bilateral   . CORONARY ATHERECTOMY N/A 08/12/2017   Procedure: CORONARY ATHERECTOMY;  Surgeon: Adrian Prows, MD;  Location: [place] [facility];  Service: Cardiovascular;  Laterality: N/A;  . CORONARY STENT INTERVENTION N/A 08/12/2017   Procedure: CORONARY STENT INTERVENTION;  Surgeon: Adrian Prows, MD;  Location: [place] [facility];  Service: Cardiovascular;  Laterality: N/A;  . GREEN LIGHT LASER TURP (TRANSURETHRAL RESECTION OF PROSTATE N/A 10/10/2012   Procedure: GREEN LIGHT LASER TURP (TRANSURETHRAL RESECTION OF PROSTATE;  Surgeon: Claybon Jabs, MD;  Location: [facility];  Service: Urology;  Laterality: N/A;  . LEFT HEART CATH AND CORONARY ANGIOGRAPHY N/A 08/12/2017   Procedure: LEFT HEART CATH AND CORONARY ANGIOGRAPHY;  Surgeon: Adrian Prows, MD;  Location: [place] [facility];  Service: Cardiovascular;  Laterality: N/A;  . PROSTATE BIOPSY    . TRANSURETHRAL RESECTION OF PROSTATE  10-10-2012     History reviewed. No pertinent family history.  No history of premature coronary artery disease in the family.  No history of diabetes in the family.  Social History: Social History   Socioeconomic History  . Marital status: Married    Spouse name: Not on file  . Number of children: Not on file  . Years of education: Not on file  . Highest education level: Not on file  Occupational History  . Not on file  Social Needs  . Financial resource strain: Not on file  . Food insecurity:    Worry: Not on file    Inability: Not on file  . Transportation needs:    Medical: Not on file    Non-medical: Not on file  Tobacco Use  . Smoking status: Former Smoker    Packs/day: 0.50    Years: 4.00    Pack years: 2.00    Types: Cigarettes    Last attempt to quit: 05/21/1968    Years since quitting: 49.2  . Smokeless tobacco: Never Used  Substance and Sexual Activity  . Alcohol use: Yes    Comment: occasional  . Drug use: No  .  Sexual activity: Not on file  Lifestyle  . Physical activity:    Days per week: Not on file    Minutes per session: Not on file  . Stress: Not on file  Relationships  . Social connections:    Talks on phone: Not on file    Gets together: Not on file    Attends religious service: Not on file    Active member of club or organization: Not on file    Attends meetings of clubs or organizations: Not on file    Relationship status: Not on file  . Intimate partner violence:    Fear of current or ex partner: Not on file    Emotionally abused: Not on file    Physically abused: Not on file    Forced sexual activity:  Not on file  Other Topics Concern  . Not on file  Social History Narrative  . Not on file    Review of Systems - Negative except Chronic bilateral tingling and numbness in his legs due to cervical spine stenosis, right leg and arm weakness from cervical spine stenosis.    Physical Exam: Blood pressure 135/68, pulse (!) 56, resp. rate (!) 28, height 5\' 3"  (1.6 m), weight 68.9 kg (152 lb), SpO2 98 %.   General appearance: alert, cooperative, appears stated age and no distress Lungs: clear to auscultation bilaterally Chest wall: no tenderness Heart: regular rate and rhythm, S1, S2 normal, no murmur, click, rub or gallop Abdomen: Mildly obese, bowel sounds heard in all 4 quadrants, epigastric tenderness present. Extremities: extremities normal, atraumatic, no cyanosis or edema Pulses: 2+ and symmetric Neurologic: Grossly normal  Labs:  Lab Results  Component Value Date   WBC 6.1 08/21/2017   HGB 12.0 (L) 08/21/2017   HCT 36.4 (L) 08/21/2017   MCV 95.8 08/21/2017   PLT 220 08/21/2017   Recent Labs  Lab 08/21/17 1247  NA 136  K 4.3  CL 103  CO2 25  BUN 21*  CREATININE 0.80  CALCIUM 8.7*  GLUCOSE 94    Lipid Panel     Component Value Date/Time   CHOL 180 08/10/2017 1703   TRIG 91 08/10/2017 1703   HDL 60 08/10/2017 1703   CHOLHDL 3.0 08/10/2017 1703   VLDL 18 08/10/2017 1703   LDLCALC 102 (H) 08/10/2017 1703   Cardiac Panel (last 3 results) Recent Labs    08/10/17 1636 08/10/17 2307 08/11/17 0249  TROPONINI <0.03 <0.03 <0.03    Lab Results  Component Value Date   TROPONINI <0.03 08/11/2017     TSH Recent Labs    08/10/17 1710  TSH 1.676   Radiology: Dg Chest 2 View  Result Date: 08/21/2017 CLINICAL DATA:  Bilateral chest pain with abdominal swelling and pain. Recent angioplasty. EXAM: CHEST - 2 VIEW COMPARISON:  08/10/2017 FINDINGS: Artifact overlies the chest. Heart size is normal. Mediastinal shadows are normal. The lungs are clear. No effusions.  No significant bone finding. IMPRESSION: No active cardiopulmonary disease. Electronically Signed   By: Nelson Chimes M.D.   On: 08/21/2017 14:47    Scheduled Meds: Continuous Infusions: PRN Meds:.nitroGLYCERIN EKG 08/21/2017: Normal sinus rhythm.  Left axis deviation.  LVH. Nonspecific T wave flattening.  Unchanged from EKG 08/11/2017 EKG 08/10/2017: Normal sinus rhythm, left axis deviation, deep T wave inversion in 3 and aVF suggestive of inferior ischemia. Compared to EKG performed by EMS at home, EKG changes are new. Repeat EKG reveals resolution of T wave inversion  Telemetry 08/21/2017: Normal sinus  rhythm.  Echocardiogram 08/12/2017: Normal LV systolic function, EF 56-21%.  No significant valvular abnormality.  PA pressure up her limit of normal.  Coronary angiogram 08/12/2017: Mild disease in the RCA, proximal 30%. Left coronary severe calcification evident in the left main and also in the proximal circumflex and LAD. Proximal LAD 90% calcific high-grade stenosis S/P diamondback atherectomy and stenting with PTCA and atherectomy followed by stenting of the Proximal LAD with 3.5x23 mm Sierra DES. 90% stenosis reduced to 0% with TIMI-3 to TIMI-3 flow maintained at the end of the procedure. Low normal LVEF at 55%.   ASSESSMENT AND PLAN:  1. Chest pain more consistent with GERD. No EKG changes and no response to s/l NTG. Patient had chest tightness when he presented initially with unstable angina on 08/10/17 and was ntg responsive. 2.  Hypertension which is well controlled 3.  Very mild hyperlipidemia, tolerating statin.  Recommendation: I advised the patient that if he develops any chest tightness that she takes abnormal nitroglycerin.  The presenting symptoms of heart burning sensation and abdominal discomfort are probably related to GERD.  Will recommend starting him on omeprazole 40 mg daily 30 minutes prior to his meal.  If delta troponin is negative he can be discharged home.   He has an appointment to see me in the office tomorrow.  Adrian Prows, MD 08/21/2017, 5:19 PM [facility] [place] Pager: [phone] Office: [phone] If no answer Cell [phone]

## 2017-08-21 NOTE — ED Notes (Signed)
Spoke with Dr. Einar Gip and he requested the Trop be repeated in 2 hours and if there is no increase, pt should be stable to discharge.  Please call Dr. Einar Gip with any questions.    Pt and family made aware.

## 2017-08-21 NOTE — ED Triage Notes (Signed)
Pt in from home via [facility] with bilateral lateral chest pain, abdominal swelling/pain. Pt s/p angioplasty 5 days ago. Denies n/v, sob or diaphoresis. C/o "burning" in back. A&ox4, BP 140/50.

## 2017-08-21 NOTE — ED Provider Notes (Signed)
Identifying from Dr. Leonette Monarch.  84 year old male with recent history of LAD stenting here with atypical chest pain.  Initial workup is been unremarkable. Physical Exam  BP 135/68   Pulse (!) 56   Resp (!) 28   Ht 5\' 3"  (1.6 m)   Wt 68.9 kg (152 lb)   SpO2 98%   BMI 26.93 kg/m   Physical Exam  ED Course/Procedures   Clinical Course as of Aug 23 1109  Wed Aug 21, 2017  1425 Concern for possible restenosis versus Side effect from Brilinta versus GI symptoms. EKG without acute ischemic changes or evidence of pericarditis.  Initial troponin negariv low pretest probability for pulmonary embolism.  Reason patient not classic for aortic dissection or esophageal perforation.  Currently still pending chest x-ray.  Discussed case with Dr. Einar Gip who agreed with obtaining a second troponin.  He requested that we provide the patient with Protonix and caffeine.  He will evaluate the patient following the delta troponin.     [PC]  0254 Chest x-ray without evidence suggestive of pneumonia, pneumothorax, pneumomediastinum.  No abnormal contour of the mediastinum to suggest dissection. No evidence of acute injuries.    [PC]  2706 Patient second troponin is 0.03.  Apparently Dr. Einar Gip had communicated with the nurse that he would like another troponin in 2 hours and if that has not risen then the patient can go.   [MB]  2004 Third troponin is 0.00.  Patient asymptomatic and can be discharged.   [MB]    Clinical Course User Index [MB] Hayden Rasmussen, MD [PC] Leonette Monarch Grayce Sessions, MD    Procedures  MDM  Sign out is that the patient is pending a face-to-face with his cardiologist Dr. Einar Gip who is a just evaluated the patient.  He is comfortable the patient can be discharged when the delta troponin is negative.  We will be providing him a prescription from omeprazole and he has follow-up in the clinic tomorrow.      Hayden Rasmussen, MD 08/23/17 [phone]

## 2017-08-30 ENCOUNTER — Telehealth (HOSPITAL_COMMUNITY): Payer: Self-pay

## 2017-08-30 NOTE — Telephone Encounter (Signed)
Called patient to see if he is interested in the Cardiac rehab Program. Patient stated he is not interested with no explanation. Closed referral.

## 2018-10-24 ENCOUNTER — Ambulatory Visit: Payer: Medicare HMO | Admitting: Family Medicine

## 2018-12-01 ENCOUNTER — Ambulatory Visit: Payer: Self-pay | Admitting: Cardiology

## 2018-12-01 ENCOUNTER — Ambulatory Visit (INDEPENDENT_AMBULATORY_CARE_PROVIDER_SITE_OTHER): Payer: Medicare HMO

## 2018-12-01 ENCOUNTER — Other Ambulatory Visit: Payer: Self-pay

## 2018-12-01 ENCOUNTER — Encounter: Payer: Self-pay | Admitting: Physical Medicine and Rehabilitation

## 2018-12-01 ENCOUNTER — Ambulatory Visit (INDEPENDENT_AMBULATORY_CARE_PROVIDER_SITE_OTHER): Payer: Medicare HMO | Admitting: Physical Medicine and Rehabilitation

## 2018-12-01 DIAGNOSIS — M419 Scoliosis, unspecified: Secondary | ICD-10-CM

## 2018-12-01 DIAGNOSIS — M545 Low back pain, unspecified: Secondary | ICD-10-CM

## 2018-12-01 DIAGNOSIS — M4807 Spinal stenosis, lumbosacral region: Secondary | ICD-10-CM

## 2018-12-01 DIAGNOSIS — R531 Weakness: Secondary | ICD-10-CM

## 2018-12-01 DIAGNOSIS — M961 Postlaminectomy syndrome, not elsewhere classified: Secondary | ICD-10-CM

## 2018-12-01 DIAGNOSIS — M48061 Spinal stenosis, lumbar region without neurogenic claudication: Secondary | ICD-10-CM

## 2018-12-01 DIAGNOSIS — M5136 Other intervertebral disc degeneration, lumbar region: Secondary | ICD-10-CM

## 2018-12-01 DIAGNOSIS — M2170 Unequal limb length (acquired), unspecified site: Secondary | ICD-10-CM

## 2018-12-01 DIAGNOSIS — R32 Unspecified urinary incontinence: Secondary | ICD-10-CM

## 2018-12-01 NOTE — Progress Notes (Signed)
Numeric Pain Rating Scale and Functional Assessment Average Pain 5 - first thing in morning.   In the last MONTH (on 0-10 scale) has pain interfered with the following?  1. General activity like being  able to carry out your everyday physical activities such as walking, climbing stairs, carrying groceries, or moving a chair?  Rating(1) - very little pain after he starts moving in morning.   +Driver, +BT, -Dye Allergies.

## 2018-12-02 ENCOUNTER — Encounter: Payer: Self-pay | Admitting: Physical Medicine and Rehabilitation

## 2018-12-02 NOTE — Progress Notes (Signed)
Seth Mccall - 85 y.o. male MRN [id number]  Date of birth: 11-03-1929  Office Visit Note: Visit Date: 12/01/2018 PCP: Magnus Sinning, MD Referred by: [name], MD  Subjective: Chief Complaint  Patient presents with   Lower Back - Pain   Right Leg - Weakness   HPI: Seth Mccall is a 85 y.o. male who comes in today For evaluation and management of several issues including worsening scoliotic curvature of the spine and right leg weakness and leg length discrepancy.  Patient is present with his daughter who is a geriatrician who works at assisted living centers.  They report progressive weakness of the right hip and leg and in particular with hip flexion and trying to go up and down stairs or any incline.  Patient is sitting in wheelchair today but typically walks with a cane or a walker.  He has sustained some falls in the past and this seems to be a little bit more of a problem recently.  He is followed by his primary care physician Dr. Mellody Drown and is in reasonably decent health other than more recent unstable angina with history of stent placement.  I originally saw the patient in 2015 as I was treating his wife.  I ended up seeing the patient on one occasion only in 2016.  Those notes are available for review on our other electronic record [facility].  At that time we noted from the lumbar spine perspective that he had significantly increased right-sided scoliosis compared to 2010.  In a 6-year time span he had had pretty significant increase.  It is not a very large curve he gets more of an abrupt change in the lower lumbar segment and then compensatory leftward curvature in the low thoracic area.  There was also noted at the time of increased pelvic rotation.  This was causing a perceived leg length discrepancy.  We had discussed the case with Dr. Louanne Skye the spine surgeon in our office who recommended following along with serial imaging if needed.  Patient did obtain a shoe lift  at that time from [employer].  He had not noted at that time any real pain just difficulty with the shortened lower limb.  He had had MRI imaging from 2010 showing moderate stenosis of the lumbar spine.  This was reviewed at the time at [facility].  He has had significant cervical spine disease with history of C4-C7 fusion this was completed many years ago in a different state.  His spine has been reviewed by Dr. Verita Schneiders and Dr. Arnoldo Morale from a neurosurgical standpoint.  Last MRI of the cervical spine completed around 2014 still shows some significant central narrowing of the upper cervical region above the fusion.  He has not endorsed any pain or paresthesias in the legs.  He has not really endorse balance difficulties other than weakness of the right leg and tripping with the foot differential.  He currently has home health physical therapy and they suggested putting an orthotic in the shoe for differential wear pattern and loss of arch in the right foot.  Patient does not have diabetes.  He has had a history of malignant prostate cancer treated with radiation and TURP.  He does endorse increasing urinary leakage as he refers to it.  He reports now at night but he will have to use a pad which is something he has not had to do but it is increased slowly over time.  He has not had recent follow-up with his  urologist.  He can sense when he needs to go the bathroom's but sometimes during the day he just does not have the time to get there because of decreased mobility.  He has no difficulty with bowel incontinence.  He actually stay somewhat constipated.  He has had no urinary retention.  Review of Systems  Constitutional: Negative for chills, fever, malaise/fatigue and weight loss.  HENT: Negative for hearing loss and sinus pain.   Eyes: Negative for blurred vision, double vision and photophobia.  Respiratory: Negative for cough and shortness of breath.   Cardiovascular: Negative for chest pain, palpitations and  leg swelling.  Gastrointestinal: Negative for abdominal pain, nausea and vomiting.  Genitourinary: Negative for flank pain.  Musculoskeletal: Positive for falls. Negative for myalgias.       Effective right leg length discrepancy  Skin: Negative for itching and rash.  Neurological: Positive for focal weakness and weakness. Negative for tremors.  Endo/Heme/Allergies: Negative.   Psychiatric/Behavioral: Negative for depression.  All other systems reviewed and are negative.  Otherwise per HPI.  Assessment & Plan: Visit Diagnoses:  1. Bilateral low back pain, unspecified chronicity, unspecified whether sciatica present   2. Scoliosis, unspecified scoliosis type, unspecified spinal region   3. Leg length discrepancy   4. Weakness   5. Urinary incontinence, unspecified type   6. Spinal stenosis of lumbar region without neurogenic claudication   7. Post laminectomy syndrome   8. Other intervertebral disc degeneration, lumbar region   9. Spinal stenosis of lumbosacral region     Plan: Findings:  History of progressive lumbar scoliosis and pelvic rotation.  No real significant disease of the hips bilaterally.  Really unsure at this point why he has had this increased curvature.  There is no big S-curve no difficulty breathing small amount of compensatory scoliosis of the thoracic spine.  Mild increased kyphosis.  It does give him a pretty significant effective leg length discrepancy on the right.  He has no pain really no back pain or hip pain.  On exam there seems to be good strength in the lower extremities to specific muscle testing although he has a difficult time with hip flexion.  Knee extension dorsiflexion and plantar flexion are actually fairly good on the right even compared to the left.  The left shows no problems.  No neurologic changes on exam.  I reassured the patient and his daughter that I do not think that a lot of this is neurogenic in nature.  I think is bladder issues are just a  combination of having had prior TURP as well as unfortunately is getting older.  He has appropriate sensation of bowel and bladder.  If this were to worsen or change I would recommend follow-up with his urologist.  In terms of his weakness I think he would do well with physical therapy for increased strengthening and gait training given his leg length discrepancy.  I do think they could try to see if there is anything they could look at in terms of his scoliosis and pelvic rotation and just see if there is a good course that he could maintain.  He has difficulty because he does have to still take care of his wife they do live alone.  The daughter is very involved with her care.  I think outpatient physical therapy would be much better than his in-home therapy if it could be done.  We will make a referral to [facility] as they do a very good job there.  We also wrote a prescription for [employer] to try to include in his shoe or at least does not insert an arch support.  I am not sure if [employer] even does that over-the-counter I do not necessarily think it needs to be custom but it could be.  I am leaving that up to them and they can call with questions.  I discussed this with the patient's daughter.  I do think with his weakness and history of spine issues it is worth an MRI of the lumbar spine and his daughter agrees.  They are not necessarily looking for major work-up of his spine and she is mainly concerned that he is becoming deconditioned during the coronavirus.  Greater than 50% of this visit (total duration of visit was 45 min) was spent in counseling and coordination of care discussing lumbar spine scoliosis and leg length discrepancy and gait training as well as potential for neurogenic issues with spinal problems.    Meds & Orders: No orders of the defined types were placed in this encounter.   Orders Placed This Encounter  Procedures   XR Thoracic Spine 2 View   XR Lumbar Spine 2-3 Views    MR [place]   Ambulatory referral to Physical Therapy    Follow-up: Return for MRI review after completion.   Procedures: No procedures performed  No notes on file   Clinical History: No specialty comments available.   He reports that he quit smoking about 50 years ago. His smoking use included cigarettes. He has a 2.00 pack-year smoking history. He has never used smokeless tobacco. No results for input(s): HGBA1C, LABURIC in the last 8760 hours.  Objective:  VS:  HT:     WT:    BMI:      BP:    HR: bpm   TEMP: ( )   RESP:  Physical Exam Vitals signs and nursing note reviewed.  Constitutional:      General: He is not in acute distress.    Appearance: Normal appearance. He is well-developed.  HENT:     Head: Normocephalic and atraumatic.     Nose: Nose normal.  Eyes:     General: No scleral icterus.    Extraocular Movements: Extraocular movements intact.     Conjunctiva/sclera: Conjunctivae normal.     Pupils: Pupils are equal, round, and reactive to light.  Neck:     Musculoskeletal: Neck supple. No muscular tenderness.     Trachea: No tracheal deviation.  Cardiovascular:     Rate and Rhythm: Normal rate and regular rhythm.     Pulses: Normal pulses.  Pulmonary:     Effort: Pulmonary effort is normal.     Breath sounds: Normal breath sounds. Stridor present.  Abdominal:     General: There is no distension.     Palpations: Abdomen is soft.     Tenderness: There is no guarding or rebound.  Musculoskeletal:        General: Deformity present.     Right lower leg: No edema.     Left lower leg: No edema.     Comments: Patient is slow to arise from a seated position and he does stand with rightward leaning scoliosis.  He has no pain or taut bands along the lumbar spine and no deformation or step-off.  He has unequal pelvic heights higher on the left with effective leg length discrepancy on the right.  Patient is wearing a shoe buildup.  In terms of strength  he does have the ability to resist hip flexion and essentially has probably 3+ strength.  He has really good strength on the right and left with knee extension knee flexion dorsiflexion plantarflexion EHL.  He has no clonus bilaterally.  He has a negative Hoffmann's test bilaterally.  Cervical spine is forward flexed with decreased range of motion left and right.  No pain with hip rotation.  Difficulty with hip flexion on the right against gravity on his own.  No sensation loss he has normal sensation bilaterally.  Skin:    General: Skin is warm and dry.     Findings: No erythema or rash.  Neurological:     General: No focal deficit present.     Mental Status: He is alert and oriented to person, place, and time.     Motor: No abnormal muscle tone.     Coordination: Coordination normal.     Gait: Gait normal.  Psychiatric:        Mood and Affect: Mood normal.        Behavior: Behavior normal.        Thought Content: Thought content normal.     Ortho Exam Imaging: Xr Thoracic Spine 2 View  Result Date: 12/02/2018 AP and lateral thoracic spine shows compensatory rightward curvature mid to upper thoracic spine without much of the way of rotation as seen in the lumbar.  Fairly normal lung markings with normal cardiac silhouette no pneumonia.  Can see cervical fusion plate.  Somewhat increased upper kyphosis but without any specific vertebral body fracture.  Xr Lumbar Spine 2-3 Views  Result Date: 12/02/2018 AP and lateral lumbar spine shows increased rotatory significant lumbar scoliosis compared to 2016.  There is significant pelvic rotation and a left convex scoliosis of the lower lumbar but immediately has a significant right leaning scoliosis again with more rotation than prior.  This is shown with x-ray lined up at the pelvis with somewhat of an alignment of the hips and pubic symphysis as a alignment point.  There is pelvic height discrepancy with left higher than right.  There is some  degenerative joint disease of the hips bilaterally but is mild.  There is multilevel degenerative disc changes and facet arthropathy of the lower spine.   Past Medical/Family/Surgical/Social History: Medications & Allergies reviewed per EMR, new medications updated. Patient Active Problem List   Diagnosis Date Noted   Unstable angina ([place]) 08/10/2017   Candida infection 08/30/2015   Malignant neoplasm of prostate ([place]) 04/28/2015   BPH (benign prostatic hypertrophy) with urinary obstruction 10/09/2012   Past Medical History:  Diagnosis Date   Arthritis    Complication of anesthesia    limited movement in neck because of fusion   Gout    Hypertension    Prostate cancer ([place])    History reviewed. No pertinent family history. Past Surgical History:  Procedure Laterality Date   BACK SURGERY  2005   fusion of C3, C4, and C5   CATARACT EXTRACTION Bilateral    CORONARY ATHERECTOMY N/A 08/12/2017   Procedure: CORONARY ATHERECTOMY;  Surgeon: Adrian Prows, MD;  Location: [facility];  Service: Cardiovascular;  Laterality: N/A;   CORONARY STENT INTERVENTION N/A 08/12/2017   Procedure: CORONARY STENT INTERVENTION;  Surgeon: Adrian Prows, MD;  Location: [facility];  Service: Cardiovascular;  Laterality: N/A;   GREEN LIGHT LASER TURP (TRANSURETHRAL RESECTION OF PROSTATE N/A 10/10/2012   Procedure: GREEN LIGHT LASER TURP (TRANSURETHRAL RESECTION OF PROSTATE;  Surgeon: Claybon Jabs,  MD;  Location: [facility];  Service: Urology;  Laterality: N/A;   LEFT HEART CATH AND CORONARY ANGIOGRAPHY N/A 08/12/2017   Procedure: LEFT HEART CATH AND CORONARY ANGIOGRAPHY;  Surgeon: Adrian Prows, MD;  Location: [facility];  Service: Cardiovascular;  Laterality: N/A;   PROSTATE BIOPSY     TRANSURETHRAL RESECTION OF PROSTATE  10-10-2012   Social History   Occupational History   Not on file  Tobacco Use   Smoking status: Former Smoker    Packs/day: 0.50    Years: 4.00    Pack  years: 2.00    Types: Cigarettes    Quit date: 05/21/1968    Years since quitting: 50.5   Smokeless tobacco: Never Used  Substance and Sexual Activity   Alcohol use: Yes    Comment: occasional   Drug use: No   Sexual activity: Not on file

## 2018-12-08 ENCOUNTER — Encounter: Payer: Self-pay | Admitting: Physical Therapy

## 2018-12-08 ENCOUNTER — Other Ambulatory Visit: Payer: Self-pay

## 2018-12-08 ENCOUNTER — Ambulatory Visit: Payer: Medicare HMO | Attending: Physical Medicine and Rehabilitation | Admitting: Physical Therapy

## 2018-12-08 DIAGNOSIS — M6281 Muscle weakness (generalized): Secondary | ICD-10-CM

## 2018-12-08 DIAGNOSIS — R2689 Other abnormalities of gait and mobility: Secondary | ICD-10-CM

## 2018-12-08 DIAGNOSIS — R293 Abnormal posture: Secondary | ICD-10-CM | POA: Diagnosis present

## 2018-12-08 NOTE — Therapy (Signed)
[facility] [address] [place], [place], [address] Phone: [phone]   Fax:  [phone]  Physical Therapy Evaluation  Patient Details  Name: Seth Mccall MRN: [id number] Date of Birth: [birth year] Referring Provider (PT): Magnus Sinning, MD   Encounter Date: 12/08/2018  PT End of Session - 12/08/18 2011    Visit Number  1    Date for PT Re-Evaluation  02/02/19    Authorization Type  [employer]    PT Start Time  1800    PT Stop Time  1850    PT Time Calculation (min)  50 min    Equipment Utilized During Treatment  Gait belt    Activity Tolerance  Patient tolerated treatment well    Behavior During Therapy  [facility] for tasks assessed/performed       Past Medical History:  Diagnosis Date  . Arthritis   . Complication of anesthesia    limited movement in neck because of fusion  . Gout   . Hypertension   . Prostate cancer [facility])     Past Surgical History:  Procedure Laterality Date  . BACK SURGERY  2005   fusion of C3, C4, and C5  . CATARACT EXTRACTION Bilateral   . CORONARY ATHERECTOMY N/A 08/12/2017   Procedure: CORONARY ATHERECTOMY;  Surgeon: Adrian Prows, MD;  Location: [place] [facility];  Service: Cardiovascular;  Laterality: N/A;  . CORONARY STENT INTERVENTION N/A 08/12/2017   Procedure: CORONARY STENT INTERVENTION;  Surgeon: Adrian Prows, MD;  Location: [place] [facility];  Service: Cardiovascular;  Laterality: N/A;  . GREEN LIGHT LASER TURP (TRANSURETHRAL RESECTION OF PROSTATE N/A 10/10/2012   Procedure: GREEN LIGHT LASER TURP (TRANSURETHRAL RESECTION OF PROSTATE;  Surgeon: Claybon Jabs, MD;  Location: [facility];  Service: Urology;  Laterality: N/A;  . LEFT HEART CATH AND CORONARY ANGIOGRAPHY N/A 08/12/2017   Procedure: LEFT HEART CATH AND CORONARY ANGIOGRAPHY;  Surgeon: Adrian Prows, MD;  Location: [place] [facility];  Service: Cardiovascular;  Laterality: N/A;  . PROSTATE BIOPSY    .  TRANSURETHRAL RESECTION OF PROSTATE  10-10-2012    There were no vitals filed for this visit.   Subjective Assessment - 12/08/18 1801    Subjective  Pt arrives in manual wheelchair with his adult daughter.  [name] reports that Pt was much more mobile and active before covid-19 closures.  She suspects inactivity has contributed to his weakness.  Pt reports no pain but notes Rt leg is weak - "I can't lift it, I have to use my hands.  My torso when I stand is leaning toward the right.  I fall frequently and when I do I fall to the Rt."  Prior to covid-19 closures, Pt used straight cane.  Now Pt primarily uses rollator walker in house with some use of cane, and a manual push wheelchair for community.    Patient is accompained by:  Family member   adult daughter   Pertinent History  fall risk, worsening scoliosis, prostate cancer, heart surgeries, cervical fusion x 3 levels    Limitations  Standing;Walking    How long can you sit comfortably?  unlimited    How long can you stand comfortably?  30 min    How long can you walk comfortably?  100 feet    Diagnostic tests  xrays 11/2018, progression of scoliosis as compared to 2016, lumbar multilevel DDD, facet arthoropathy    Patient Stated Goals  get stronger, walk greater distances,  get in/out of tub without substitutions for weak leg, correct trunk lean to Rt    Currently in Pain?  No/denies         [facility] PT Assessment - 12/08/18 0001      Assessment   Medical Diagnosis  M41.9 (ICD-10-CM) - Scoliosis, unspecified scoliosis type, unspecified spinal region, M21.70 (ICD-10-CM) - Leg length discrepancy, R53.1 (ICD-10-CM) - Weakness, M48.061 (ICD-10-CM) - Spinal stenosis of lumbar region without neurogenic claudication    Referring Provider (PT)  Magnus Sinning, MD    Onset Date/Surgical Date  --   weakness started after covid-19, dec activity   Hand Dominance  Right    Next MD Visit  no    Prior Therapy  home PT      Precautions    Precautions  Fall      Restrictions   Weight Bearing Restrictions  No      Balance Screen   Has the patient fallen in the past 6 months  Yes    How many times?  5    Has the patient had a decrease in activity level because of a fear of falling?   Yes    Is the patient reluctant to leave their home because of a fear of falling?   Yes      [place] residence    Living Arrangements  Spouse/significant other    Type of Loving Access  Level entry    Home Layout  One level    Anthony - 4 wheels;Cane - single point;Wheelchair - manual;Grab bars - tub/shower      Prior Function   Level of Independence  Independent    Vocation  Retired    Leisure  cooking, Teaching laboratory technician, read      Charity fundraiser Status  Within Functional Limits for tasks assessed      Observation/Other Assessments   Observations  seated in manual WC, leans to Rt, inreased thoracic kyphosis and forward head    Scoliosis  yes    Focus on Therapeutic Outcomes (FOTO)   74%   goal 54%     Sensation   Light Touch  Appears Intact      Functional Tests   Functional tests  Single leg stance;Sit to Stand      Single Leg Stance   Comments  ipsilateral trunk lean, requires max bil UE support      Sit to Stand   Comments  uses cane and arm rest, needs three trials to succeed in sit to stand to generate momentum on first trial, then improves      Posture/Postural Control   Posture/Postural Control  Postural limitations    Postural Limitations  Increased thoracic kyphosis;Decreased lumbar lordosis;Left pelvic obliquity;Weight shift right;Flexed trunk;Forward head;Rounded Shoulders      ROM / Strength   AROM / PROM / Strength  Strength      Strength   Strength Assessment Site  Hip    Right/Left Hip  Right;Left    Right Hip Flexion  3/5    Right Hip Extension  3+/5    Right Hip External Rotation   4/5    Right Hip Internal Rotation  4/5     Right Hip ABduction  3+/5    Right Hip ADduction  4/5    Left Hip Flexion  4-/5    Left Hip Extension  3+/5    Left Hip  External Rotation  4/5    Left Hip Internal Rotation  4/5    Left Hip ABduction  3+/5    Left Hip ADduction  4/5      Ambulation/Gait   Ambulation/Gait  Yes    Ambulation/Gait Assistance  4: Min guard    Ambulation Distance (Feet)  80 Feet    Assistive device  Straight cane    Gait Pattern  Step-to pattern    Ambulation Surface  Level    Gait Comments  Rt UE single point cane, Rt lateral trunk lean, shuffles Rt foot to step to Lt foot, signif thoracic kyphosis scoliosis affecting trunk posture      Balance   Balance Assessed  Yes    Balance comment  max use of bil UEs in SLS, ipsilateral SB for bil SLS      Standardized Balance Assessment   Standardized Balance Assessment  Timed Up and Go Test;Five Times Sit to Stand    Five times sit to stand comments   33 sec with max use of bil UEs (cane and armrest)      Timed Up and Go Test   TUG  Normal TUG    Normal TUG (seconds)  51    TUG Comments  single point cane, contact guard assistance with gait belt, slow turns, shuffles Rt foot      Functional Gait  Assessment   Gait assessed   --                Objective measurements completed on examination: See above findings.              PT Education - 12/08/18 1845    Education Details  Access Code: [email or web address]    Person(s) Educated  Patient;Child(ren)    Methods  Explanation;Demonstration;Verbal cues;Handout    Comprehension  Verbalized understanding;Returned demonstration       PT Short Term Goals - 12/08/18 2129      PT SHORT TERM GOAL #1   Title  Pt will be ind in intial HEP    Time  3    Period  Weeks    Status  New    Target Date  12/29/18      PT SHORT TERM GOAL #2   Title  Pt will ambulate at least 60 feet with step to or step through gait pattern with single point cane without shuffling Rt LE at least 50% of the time.     Time  4    Period  Weeks    Status  New    Target Date  01/05/19      PT SHORT TERM GOAL #3   Title  Pt will perform sit to stand with UEs as needed using proper mechanics, symmetry and no LOB without use of momentum x 5 reps.    Time  4    Period  Weeks    Status  New    Target Date  01/05/19        PT Long Term Goals - 12/08/18 2136      PT LONG TERM GOAL #1   Title  Pt will be safe and independent with advanced HEP.    Time  8    Period  Weeks    Status  New    Target Date  02/02/19      PT LONG TERM GOAL #2   Title  Pt will achieve community distance ambulation with more erect posture with proper advancement of Rt  LE for step to or step through pattern (no shuffling) at least 75% of the time, using rollator or single point cane.    Time  8    Period  Weeks    Status  New    Target Date  02/02/19      PT LONG TERM GOAL #3   Title  Pt will improve TUG time to </= 40 sec to demo improved functional mobility and less fall risk.    Baseline  51    Time  8    Period  Weeks    Status  New    Target Date  02/02/19      PT LONG TERM GOAL #4   Title  Pt will improve 5x sit to stand to </= 25 sec to demo improved functional mobility and less fall risk.    Baseline  33    Time  8    Period  Weeks    Status  New    Target Date  02/02/19      PT LONG TERM GOAL #5   Title  Pt will improve LE strength to at least 4+/5 throughout bil LEs to improve safety and functional mobility for daily tasks such as getting in/out of tub.    Time  8    Period  Weeks    Status  New    Target Date  02/02/19             Plan - 12/08/18 2012    Clinical Impression Statement  Pt presents to PT with weakness in trunk and Rt LE with history of 5 falls (always to Rt side) in the last 6 months.  He and his adult daughter reports they believe inactivity due to covid-closures have contributed.  Pt uses rollator walker and cane at home and manual WC in community currently, whereas he used to  be ind with single point cane earlier this year.  He has signif postural deficits secondary to scoliosis and kyphosis, weakness in Rt hip flexors, bil extensors and bil abductors, gait dysfunction (Rt LE shuffle but is able to correct with cueing), difficulty with functional tasks such as sit to stand, and poor balance. 5x sit to stand was 33 sec and TUG was 51 sec.  Pt will benefit from skilled PT for LE and trunk strength, postural re-ed, gait training, balance and functional activity training to improve independence and safety within home and community.    Personal Factors and Comorbidities  Age;Comorbidity 1;Comorbidity 2    Comorbidities  fall risk, progressive scoliosis    Examination-Activity Limitations  Bathing;Locomotion Level;Transfers;Bed Mobility;Squat;Hygiene/Grooming;Toileting;Stand    Examination-Participation Restrictions  Meal Prep;Cleaning;Driving;Laundry   does not drive anymore   Stability/Clinical Decision Making  Evolving/Moderate complexity    Clinical Decision Making  Moderate    Rehab Potential  Good    PT Frequency  2x / week    PT Duration  8 weeks    PT Treatment/Interventions  ADLs/Self Care Home Management;Electrical Stimulation;Cryotherapy;Moist Heat;DME Instruction;Gait training;Stair training;Functional mobility training;Therapeutic activities;Therapeutic exercise;Balance training;Neuromuscular re-education;Patient/family education;Manual techniques;Passive range of motion;Spinal Manipulations;Joint Manipulations;Energy conservation;Dry needling    PT Next Visit Plan  review and update HEP as needed, gait training for Rt LE advancing w/o shuffling, sit to stand, LE and trunk strength    PT Home Exercise Plan  Access Code: [email or web address]    Consulted and Agree with Plan of Care  Patient;Family member/caregiver    Family Member Consulted  adult daughter  Patient will benefit from skilled therapeutic intervention in order to improve the following deficits and  impairments:  Abnormal gait, Decreased coordination, Difficulty walking, Decreased range of motion, Decreased endurance, Decreased activity tolerance, Decreased balance, Hypomobility, Impaired flexibility, Improper body mechanics, Decreased mobility, Decreased strength, Postural dysfunction  Visit Diagnosis: 1. Muscle weakness (generalized)   2. Abnormal posture   3. Other abnormalities of gait and mobility        Problem List Patient Active Problem List   Diagnosis Date Noted  . Unstable angina ([place]) 08/10/2017  . Candida infection 08/30/2015  . Malignant neoplasm of prostate ([place]) 04/28/2015  . BPH (benign prostatic hypertrophy) with urinary obstruction 10/09/2012    Baruch Merl, PT 12/08/18 9:45 PM   [facility] [address] [place], [place], [address] Phone: [phone]   Fax:  [phone]  Name: Seth Mccall MRN: [id number] Date of Birth: [birth year]

## 2018-12-08 NOTE — Patient Instructions (Signed)
Access Code: [email or web address]  URL: [email or web address]  Date: 12/08/2018  Prepared by: Venetia Night Beuhring   Exercises  Seated March - 10 reps - 3 sets - 1x daily - 7x weekly  Sit to Stand with Armchair - 10 reps - 2 sets - 1x daily - 7x weekly  Seated Scapular Retraction - 10 reps - 3 sets - 1x daily - 7x weekly  Seated Transversus Abdominis Bracing - 10 reps - 2 sets - 10 hold - 1x daily - 7x weekly  Standing March with Counter Support - 20 reps - 2 sets - 1x daily - 7x weekly

## 2018-12-12 ENCOUNTER — Ambulatory Visit (INDEPENDENT_AMBULATORY_CARE_PROVIDER_SITE_OTHER): Payer: Medicare HMO | Admitting: Cardiology

## 2018-12-12 ENCOUNTER — Encounter: Payer: Self-pay | Admitting: Cardiology

## 2018-12-12 ENCOUNTER — Other Ambulatory Visit: Payer: Self-pay

## 2018-12-12 VITALS — BP 128/64 | HR 56 | Temp 98.6°F | Ht 63.0 in | Wt 152.0 lb

## 2018-12-12 DIAGNOSIS — E785 Hyperlipidemia, unspecified: Secondary | ICD-10-CM

## 2018-12-12 DIAGNOSIS — I2510 Atherosclerotic heart disease of native coronary artery without angina pectoris: Secondary | ICD-10-CM

## 2018-12-12 NOTE — Progress Notes (Signed)
Primary Physician/Referring:  [name], MD  Patient ID: Seth Mccall, male    DOB: Feb 08, 1930, 85 y.o.   MRN: [id number]  Chief Complaint  Patient presents with  . Coronary Artery Disease  . Follow-up    50mo  HPI:    HPI: Seth Mccall is a 85y.o.  is a [place] ([place], who has history of hypertension and [facility] with unstable angina pectoris and underwent orbital atherectomy followed by stenting to the proximal LAD with 3.5 x 23 mm DES. He has mild in the right coronary artery.  Presently doing well without any symptoms of angina pectoris. He is accompanied by his daughter and since Covid 140 has reduced activity and now has marked weakness in his legs and undergoing PT. He discontinued lisinopril due to low BP.   Past Medical History:  Diagnosis Date  . Arthritis   . Complication of anesthesia    limited movement in neck because of fusion  . Gout   . Hypertension   . Prostate cancer ([facility]    Past Surgical History:  Procedure Laterality Date  . BACK SURGERY  2005   fusion of C3, C4, and C5  . CATARACT EXTRACTION Bilateral   . CORONARY ATHERECTOMY N/A 08/12/2017   Procedure: CORONARY ATHERECTOMY;  Surgeon: GAdrian Prows MD;  [place];  Service: Cardiovascular;  Laterality: N/A;  . CORONARY STENT INTERVENTION N/A 08/12/2017   Procedure: CORONARY STENT INTERVENTION;  Surgeon: GAdrian Prows MD;  [place] [place];  Service: Cardiovascular;  Laterality: N/A;  . GREEN LIGHT LASER TURP (TRANSURETHRAL RESECTION OF PROSTATE N/A 10/10/2012   Procedure: GREEN LIGHT LASER TURP (TRANSURETHRAL RESECTION OF PROSTATE;  Surgeon: MClaybon Jabs MD;  [facility];  Service: Urology;  Laterality: N/A;  . LEFT HEART CATH AND CORONARY ANGIOGRAPHY N/A 08/12/2017   Procedure: LEFT HEART CATH AND CORONARY ANGIOGRAPHY;  Surgeon: GAdrian Prows MD;  [place];  Service: Cardiovascular;   Laterality: N/A;  . PROSTATE BIOPSY    . TRANSURETHRAL RESECTION OF PROSTATE  10-10-2012   Social History   Socioeconomic History  . Marital status: Married    Spouse name: Not on file  . Number of children: 4  . Years of education: Not on file  . Highest education level: Not on file  Occupational History  . Not on file  Social Needs  . Financial resource strain: Not on file  . Food insecurity    Worry: Not on file    Inability: Not on file  . Transportation needs    Medical: Not on file    Non-medical: Not on file  Tobacco Use  . Smoking status: Former Smoker    Packs/day: 0.50    Years: 4.00    Pack years: 2.00    Types: Cigarettes    Quit date: 05/21/1968    Years since quitting: 50.5  . Smokeless tobacco: Never Used  Substance and Sexual Activity  . Alcohol use: Yes    Comment: occasional  . Drug use: No  . Sexual activity: Not on file  Lifestyle  . Physical activity    Days per week: Not on file    Minutes per session: Not on file  . Stress: Not on file  Relationships  . Social [place] phone: Not on file    Gets together: Not on file    Attends religious service: Not on  file    Active member of club or organization: Not on file    Attends meetings of clubs or organizations: Not on file    Relationship status: Not on file  . Intimate partner violence    Fear of current or ex partner: Not on file    Emotionally abused: Not on file    Physically abused: Not on file    Forced sexual activity: Not on file  Other Topics Concern  . Not on file  Social History Narrative  . Not on file   ROS  Review of Systems  Constitution: Negative for chills, decreased appetite, malaise/fatigue and weight gain.  Cardiovascular: Negative for dyspnea on exertion, leg swelling and syncope.  Endocrine: Negative for cold intolerance.  Hematologic/Lymphatic: Does not bruise/bleed easily.  Musculoskeletal: Positive for back pain, joint pain and muscle weakness.  Negative for falls and joint swelling.  Gastrointestinal: Negative for abdominal pain, anorexia, change in bowel habit, hematochezia and melena.  Neurological: Negative for headaches and light-headedness.  Psychiatric/Behavioral: Negative for depression and substance abuse.  All other systems reviewed and are negative.  Objective  Blood pressure 128/64, pulse (!) 56, temperature 98.6 F (37 C), height _0  (1.6 m), weight 152 lb (68.9 kg), SpO2 98 %. Body mass index is 26.93 kg/m.   Physical Exam  Constitutional: He appears well-developed and well-nourished. No distress.  HENT:  Head: Atraumatic.  Eyes: Conjunctivae are normal.  Neck: Neck supple. No JVD present. No thyromegaly present.  Cardiovascular: Normal rate, regular rhythm, normal heart sounds and intact distal pulses. Exam reveals no gallop.  No murmur heard. No edema  Pulmonary/Chest: Effort normal and breath sounds normal.  Pectus excavatum  Abdominal: Soft. Bowel sounds are normal.  Musculoskeletal: Normal range of motion.  Neurological: He is alert.  Skin: Skin is warm and dry.  Psychiatric: He has a normal mood and affect.   Radiology: No results found.  Laboratory examination:   CMP Latest Ref Rng & Units 08/21/2017 08/13/2017 08/10/2017  Glucose 65 - 99 mg/dL 94 105(H) 83  BUN 6 - 20 mg/dL 21(H) 18 22(H)  Creatinine 0.61 - 1.24 mg/dL 0.80 0.94 1.01  Sodium 135 - 145 mmol/L 136 138 136  Potassium 3.5 - 5.1 mmol/L 4.3 4.3 4.0  Chloride 101 - 111 mmol/L 103 106 105  CO2 22 - 32 mmol/L _1 Calcium 8.9 - 10.3 mg/dL 8.7(L) 8.5(L) 8.7(L)  Total Protein 6.5 - 8.1 g/dL - - 6.2(L)  Total Bilirubin 0.3 - 1.2 mg/dL - - 0.7  Alkaline Phos 38 - 126 U/L - - 52  AST 15 - 41 U/L - - 25  ALT 17 - 63 U/L - - 25   CBC Latest Ref Rng & Units 08/21/2017 08/13/2017 08/12/2017  WBC 4.0 - 10.5 K/uL 6.1 7.6 7.7  Hemoglobin 13.0 - 17.0 g/dL 12.0(L) 13.1 13.5  Hematocrit 39.0 - 52.0 % 36.4(L) 37.3(L) 38.8(L)  Platelets 150 -  400 K/uL 220 166 166   10/17/2017: Cholesterol 130, triglycerides 64, HDL 66, LDL 51.   RBC 4.0, hemoglobin 12.8, normal hematocrit, CBC otherwise normal.  Creatinine 0.86, EGFR 79/91, potassium 4.6, CMP normal.   Lipid Panel     Component Value Date/Time   CHOL 180 08/10/2017 1703   TRIG 91 08/10/2017 1703   HDL 60 08/10/2017 1703   CHOLHDL 3.0 08/10/2017 1703   VLDL 18 08/10/2017 1703   LDLCALC 102 (H) 08/10/2017 1703   HEMOGLOBIN A1C No results found for: HGBA1C, MPG  TSH No results for input(s): TSH in the last 8760 hours. Medications   Current Outpatient Medications  Medication Instructions  . aspirin 81 mg, Oral, Daily  . atorvastatin (LIPITOR) 10 mg, Oral, Daily-1800  . colchicine 0.6 mg, Oral, 2 times daily, Reported on 06/02/2015  . metoprolol succinate (TOPROL-XL) 12.5 mg, Oral, Daily  . mirabegron ER (MYRBETRIQ) 25 mg, Oral, Daily  . nitroGLYCERIN (NITROSTAT) 0.4 mg, Sublingual, Every 5 min x3 PRN  . Vitamin D, Cholecalciferol, 50 MCG (2000 UT) CAPS Oral, Daily    Cardiac Studies:   Coronary angiogram 08/12/2017:  Mild disease in the RCA, proximal 30%. Left coronary severe calcification evident in the left main and also in the proximal circumflex and LAD. Proximal LAD 90% calcific high-grade stenosis S/P diamondback atherectomy and stenting with PTCA and atherectomy followed by stenting of the Proximal LAD with 3.5x23 mm [place] DES. 90% stenosis reduced to 0% with TIMI-3 to TIMI-3 flow maintained at the end of the procedure. Low normal LVEF at 55%.  Echocardiogram 00/92/3300:[id number] LV systolic function, EF 33-54%. No significant valvular abnormality. PA pressure up her limit of normal.  Assessment     ICD-10-CM   1. Coronary artery disease involving native coronary artery of native heart without angina pectoris  I25.10 EKG 12-Lead    EKG 12/12/2018: Sinus bradycardia at the rate of 54 bpm with first-degree AV block, left axis deviation, left anterior  fascicular block.  No evidence of ischemia, normal QT interval.  Recommendations:   Patient is presently doing well, essentially asymptomatic from cardiac standpoint, main issues are age related disabilities, back pain and leg weakness.  Lipids are controlled, blood pressure is well controlled, his discontinued lisinopril due to low blood pressure, which are perfectly fine.  He is on low-dose beta blocker, has underlying sinus bradycardia with first-degree AV block and left axis deviation, I could even consider discontinuing beta blocker therapy but as he is asymptomatic I'll continue the same, I'll see him back in one year.  Conservative therapy in view of his advanced age.  Adrian Prows, MD, [facility] 12/12/2018, 2:07 PM [facility]. [place] Pager: [phone] Office: [phone] If no answer Cell [phone]

## 2018-12-18 ENCOUNTER — Ambulatory Visit: Payer: Medicare HMO

## 2018-12-18 ENCOUNTER — Other Ambulatory Visit: Payer: Self-pay

## 2018-12-18 DIAGNOSIS — R293 Abnormal posture: Secondary | ICD-10-CM

## 2018-12-18 DIAGNOSIS — R2689 Other abnormalities of gait and mobility: Secondary | ICD-10-CM

## 2018-12-18 DIAGNOSIS — M6281 Muscle weakness (generalized): Secondary | ICD-10-CM | POA: Diagnosis not present

## 2018-12-18 NOTE — Therapy (Signed)
[facility] [address] [place], [place], [address] Phone: [phone]   Fax:  [phone]  Physical Therapy Treatment  Patient Details  Name: Seth Mccall MRN: [id number] Date of Birth: February 25, 1930 Referring Provider (PT): Magnus Sinning, MD   Encounter Date: 12/18/2018  PT End of Session - 12/18/18 1309    Visit Number  2    Date for PT Re-Evaluation  02/02/19    Authorization Type  [employer]na Medicare    PT Start Time  3903    PT Stop Time  1316    PT Time Calculation (min)  45 min    Activity Tolerance  Patient tolerated treatment well    Behavior During Therapy  [facility] for tasks assessed/performed       Past Medical History:  Diagnosis Date  . Arthritis   . Complication of anesthesia    limited movement in neck because of fusion  . Gout   . Hypertension   . Prostate cancer [facility])     Past Surgical History:  Procedure Laterality Date  . BACK SURGERY  2005   fusion of C3, C4, and C5  . CATARACT EXTRACTION Bilateral   . CORONARY ATHERECTOMY N/A 08/12/2017   Procedure: CORONARY ATHERECTOMY;  Surgeon: Adrian Prows, MD;  Location: [place] [facility];  Service: Cardiovascular;  Laterality: N/A;  . CORONARY STENT INTERVENTION N/A 08/12/2017   Procedure: CORONARY STENT INTERVENTION;  Surgeon: Adrian Prows, MD;  Location: [place] [facility];  Service: Cardiovascular;  Laterality: N/A;  . GREEN LIGHT LASER TURP (TRANSURETHRAL RESECTION OF PROSTATE N/A 10/10/2012   Procedure: GREEN LIGHT LASER TURP (TRANSURETHRAL RESECTION OF PROSTATE;  Surgeon: Claybon Jabs, MD;  Location: [facility];  Service: Urology;  Laterality: N/A;  . LEFT HEART CATH AND CORONARY ANGIOGRAPHY N/A 08/12/2017   Procedure: LEFT HEART CATH AND CORONARY ANGIOGRAPHY;  Surgeon: Adrian Prows, MD;  Location: [place] [facility];  Service: Cardiovascular;  Laterality: N/A;  . PROSTATE BIOPSY    . TRANSURETHRAL RESECTION OF PROSTATE  10-10-2012    There were  no vitals filed for this visit.  Subjective Assessment - 12/18/18 1233    Subjective  No pain today. The standing marching is the hardest for me.    Pertinent History  fall risk, worsening scoliosis, prostate cancer, heart surgeries, cervical fusion x 3 levels    Patient Stated Goals  get stronger, walk greater distances, get in/out of tub without substitutions for weak leg, correct trunk lean to Rt    Currently in Pain?  No/denies                       [facility] Adult PT Treatment/Exercise - 12/18/18 0001      Exercises   Exercises  Knee/Hip;Lumbar      Knee/Hip Exercises: Aerobic   Nustep  Level 1 x 10 minutes    PT present to monitor for fatigue     Knee/Hip Exercises: Standing   Heel Raises  --    Hip Flexion  Stengthening;Both;2 sets;10 reps;Knee bent      Knee/Hip Exercises: Seated   Long Arc Quad  Both;Strengthening;2 sets;10 reps    Ball Squeeze  x 20    Marching  Strengthening;Both;10 reps    Marching Limitations  march with abduction to simulate clearing leg over tub x20    Abduction/Adduction   Strengthening;Both;20 reps    Abd/Adduction Limitations  red band     Sit to [employer]  2 sets;5 reps;with UE support   close stand by assistance and verbal cues              PT Short Term Goals - 12/08/18 2129      PT SHORT TERM GOAL #1   Title  Pt will be ind in intial HEP    Time  3    Period  Weeks    Status  New    Target Date  12/29/18      PT SHORT TERM GOAL #2   Title  Pt will ambulate at least 60 feet with step to or step through gait pattern with single point cane without shuffling Rt LE at least 50% of the time.    Time  4    Period  Weeks    Status  New    Target Date  01/05/19      PT SHORT TERM GOAL #3   Title  Pt will perform sit to stand with UEs as needed using proper mechanics, symmetry and no LOB without use of momentum x 5 reps.    Time  4    Period  Weeks    Status  New    Target Date  01/05/19        PT Long Term Goals  - 12/08/18 2136      PT LONG TERM GOAL #1   Title  Pt will be safe and independent with advanced HEP.    Time  8    Period  Weeks    Status  New    Target Date  02/02/19      PT LONG TERM GOAL #2   Title  Pt will achieve community distance ambulation with more erect posture with proper advancement of Rt LE for step to or step through pattern (no shuffling) at least 75% of the time, using rollator or single point cane.    Time  8    Period  Weeks    Status  New    Target Date  02/02/19      PT LONG TERM GOAL #3   Title  Pt will improve TUG time to </= 40 sec to demo improved functional mobility and less fall risk.    Baseline  51    Time  8    Period  Weeks    Status  New    Target Date  02/02/19      PT LONG TERM GOAL #4   Title  Pt will improve 5x sit to stand to </= 25 sec to demo improved functional mobility and less fall risk.    Baseline  33    Time  8    Period  Weeks    Status  New    Target Date  02/02/19      PT LONG TERM GOAL #5   Title  Pt will improve LE strength to at least 4+/5 throughout bil LEs to improve safety and functional mobility for daily tasks such as getting in/out of tub.    Time  8    Period  Weeks    Status  New    Target Date  02/02/19            Plan - 12/18/18 1258    Clinical Impression Statement  Pt with first time follow-up today. Pt participated in low level strength exercises today in the clinic with close supervision and stand by assistance for safety with all exercises.  Pt with poor seated  and standing posture and requires verbal cues for TA activation and improved postural alignment.  Pt has chronic Rt LE weakness and main goal is to improve ease with getting in/out of the shower.  Pt will continue to benefit from skilled PT to address chronic LE strength and postural deficits.    PT Frequency  2x / week    PT Duration  8 weeks    PT Treatment/Interventions  ADLs/Self Care Home Management;Electrical  Stimulation;Cryotherapy;Moist Heat;DME Instruction;Gait training;Stair training;Functional mobility training;Therapeutic activities;Therapeutic exercise;Balance training;Neuromuscular re-education;Patient/family education;Manual techniques;Passive range of motion;Spinal Manipulations;Joint Manipulations;Energy conservation;Dry needling    PT Next Visit Plan  update HEP as needed- add ball squeeze and hip abduction, gait training for Rt LE advancing w/o shuffling, sit to stand, LE and trunk strength    PT Home Exercise Plan  Access Code: [email or web address]    Recommended Other Services  initial certification is signed    Consulted and Agree with Plan of Care  Patient       Patient will benefit from skilled therapeutic intervention in order to improve the following deficits and impairments:  Abnormal gait, Decreased coordination, Difficulty walking, Decreased range of motion, Decreased endurance, Decreased activity tolerance, Decreased balance, Hypomobility, Impaired flexibility, Improper body mechanics, Decreased mobility, Decreased strength, Postural dysfunction  Visit Diagnosis: 1. Abnormal posture   2. Muscle weakness (generalized)   3. Other abnormalities of gait and mobility        Problem List Patient Active Problem List   Diagnosis Date Noted  . Unstable angina ([place]) 08/10/2017  . Candida infection 08/30/2015  . Malignant neoplasm of prostate ([place]) 04/28/2015  . BPH (benign prostatic hypertrophy) with urinary obstruction 10/09/2012     Sigurd Sos, PT 12/18/18 1:24 PM  [facility] [address], [place], [place], [address] Phone: [phone]   Fax:  [phone]  Name: Akram XAVI TOMASIK MRN: [id number] Date of Birth: May 18, 1930

## 2018-12-22 ENCOUNTER — Ambulatory Visit: Payer: Medicare HMO | Attending: Physical Medicine and Rehabilitation

## 2018-12-22 ENCOUNTER — Other Ambulatory Visit: Payer: Self-pay

## 2018-12-22 DIAGNOSIS — R293 Abnormal posture: Secondary | ICD-10-CM | POA: Insufficient documentation

## 2018-12-22 DIAGNOSIS — R2689 Other abnormalities of gait and mobility: Secondary | ICD-10-CM | POA: Diagnosis present

## 2018-12-22 DIAGNOSIS — M6281 Muscle weakness (generalized): Secondary | ICD-10-CM | POA: Insufficient documentation

## 2018-12-22 NOTE — Therapy (Signed)
[facility] [address], [place], [place], [address] Phone: [phone]   Fax:  [phone]  Physical Therapy Treatment  Patient Details  Name: Seth Mccall MRN: [id number] Date of Birth: 1929/08/05 Referring Provider (PT): Magnus Sinning, MD   Encounter Date: 12/22/2018  PT End of Session - 12/22/18 1700    Visit Number  3    Date for PT Re-Evaluation  02/02/19    Authorization Type  Aetna Medicare    PT Start Time  1619    PT Stop Time  1657    PT Time Calculation (min)  38 min    Activity Tolerance  Patient tolerated treatment well    Behavior During Therapy  [facility] for tasks assessed/performed       Past Medical History:  Diagnosis Date  . Arthritis   . Complication of anesthesia    limited movement in neck because of fusion  . Gout   . Hypertension   . Prostate cancer [facility])     Past Surgical History:  Procedure Laterality Date  . BACK SURGERY  2005   fusion of C3, C4, and C5  . CATARACT EXTRACTION Bilateral   . CORONARY ATHERECTOMY N/A 08/12/2017   Procedure: CORONARY ATHERECTOMY;  Surgeon: Adrian Prows, MD;  Location: [place] [facility];  Service: Cardiovascular;  Laterality: N/A;  . CORONARY STENT INTERVENTION N/A 08/12/2017   Procedure: CORONARY STENT INTERVENTION;  Surgeon: Adrian Prows, MD;  Location: [place] [facility];  Service: Cardiovascular;  Laterality: N/A;  . GREEN LIGHT LASER TURP (TRANSURETHRAL RESECTION OF PROSTATE N/A 10/10/2012   Procedure: GREEN LIGHT LASER TURP (TRANSURETHRAL RESECTION OF PROSTATE;  Surgeon: Claybon Jabs, MD;  [facility];  Service: Urology;  Laterality: N/A;  . LEFT HEART CATH AND CORONARY ANGIOGRAPHY N/A 08/12/2017   Procedure: LEFT HEART CATH AND CORONARY ANGIOGRAPHY;  Surgeon: Adrian Prows, MD;  Location: [place] [facility];  Service: Cardiovascular;  Laterality: N/A;  . PROSTATE BIOPSY    . TRANSURETHRAL RESECTION OF PROSTATE  10-10-2012    There were  no vitals filed for this visit.                    [place] Adult PT Treatment/Exercise - 12/22/18 0001      Lumbar Exercises: Seated   Other Seated Lumbar Exercises  row: green band 2x10      Knee/Hip Exercises: Aerobic   Nustep  Level 2x 10 minutes    PT present to monitor for fatigue     Knee/Hip Exercises: Standing   Hip Flexion  Stengthening;Both;2 sets;10 reps;Knee bent      Knee/Hip Exercises: Seated   Long Arc Quad  Both;Strengthening;2 sets;10 reps    Ball Squeeze  x 20    Marching  Strengthening;Both;10 reps    Marching Limitations  march with abduction over pool noodle to simulate clearing leg over tub x20    Abduction/Adduction   Strengthening;Both;20 reps    Abd/Adduction Limitations  red band     Sit to [employer]  2 sets;5 reps;with UE support   close stand by assistance and verbal cues              PT Short Term Goals - 12/08/18 2129      PT SHORT TERM GOAL #1   Title  Pt will be ind in intial HEP    Time  3    Period  Weeks    Status  New  Target Date  12/29/18      PT SHORT TERM GOAL #2   Title  Pt will ambulate at least 60 feet with step to or step through gait pattern with single point cane without shuffling Rt LE at least 50% of the time.    Time  4    Period  Weeks    Status  New    Target Date  01/05/19      PT SHORT TERM GOAL #3   Title  Pt will perform sit to stand with UEs as needed using proper mechanics, symmetry and no LOB without use of momentum x 5 reps.    Time  4    Period  Weeks    Status  New    Target Date  01/05/19        PT Long Term Goals - 12/08/18 2136      PT LONG TERM GOAL #1   Title  Pt will be safe and independent with advanced HEP.    Time  8    Period  Weeks    Status  New    Target Date  02/02/19      PT LONG TERM GOAL #2   Title  Pt will achieve community distance ambulation with more erect posture with proper advancement of Rt LE for step to or step through pattern (no shuffling) at least  75% of the time, using rollator or single point cane.    Time  8    Period  Weeks    Status  New    Target Date  02/02/19      PT LONG TERM GOAL #3   Title  Pt will improve TUG time to </= 40 sec to demo improved functional mobility and less fall risk.    Baseline  51    Time  8    Period  Weeks    Status  New    Target Date  02/02/19      PT LONG TERM GOAL #4   Title  Pt will improve 5x sit to stand to </= 25 sec to demo improved functional mobility and less fall risk.    Baseline  33    Time  8    Period  Weeks    Status  New    Target Date  02/02/19      PT LONG TERM GOAL #5   Title  Pt will improve LE strength to at least 4+/5 throughout bil LEs to improve safety and functional mobility for daily tasks such as getting in/out of tub.    Time  8    Period  Weeks    Status  New    Target Date  02/02/19            Plan - 12/22/18 1700    Clinical Impression Statement  Pt demonstrates improved Rt hip flexion and ability to clear an object on the floor when seated.  Pt was able to tolerate increased resistance on the NuStep.  Pt requires close supervision with intermittent min assistance for transfers.  Pt fatigued quickly with standing exercise today at the end of the session.  Pt will continue to benefit from skilled PT to address strength, endurance and allow for independence with bath transfers.    PT Frequency  2x / week    PT Duration  8 weeks    PT Treatment/Interventions  ADLs/Self Care Home Management;Electrical Stimulation;Cryotherapy;Moist Heat;DME Instruction;Gait training;Stair training;Functional mobility training;Therapeutic activities;Therapeutic exercise;Balance training;Neuromuscular  re-education;Patient/family education;Manual techniques;Passive range of motion;Spinal Manipulations;Joint Manipulations;Energy conservation;Dry needling    PT Next Visit Plan  update HEP as needed- add ball squeeze and hip abduction, gait training for Rt LE advancing w/o  shuffling, sit to stand, LE and trunk strength    Consulted and Agree with Plan of Care  Patient       Patient will benefit from skilled therapeutic intervention in order to improve the following deficits and impairments:  Abnormal gait, Decreased coordination, Difficulty walking, Decreased range of motion, Decreased endurance, Decreased activity tolerance, Decreased balance, Hypomobility, Impaired flexibility, Improper body mechanics, Decreased mobility, Decreased strength, Postural dysfunction  Visit Diagnosis: 1. Muscle weakness (generalized)   2. Abnormal posture   3. Other abnormalities of gait and mobility        Problem List Patient Active Problem List   Diagnosis Date Noted  . Unstable angina ([place]) 08/10/2017  . Candida infection 08/30/2015  . Malignant neoplasm of prostate ([place]) 04/28/2015  . BPH (benign prostatic hypertrophy) with urinary obstruction 10/09/2012    Seth Mccall, PT 12/22/18 5:02 PM  [facility] [address], [place], [place], [address] Phone: [phone]   Fax:  [phone]  Name: Seth Mccall MRN: [id number] Date of Birth: [birth year]

## 2018-12-24 ENCOUNTER — Other Ambulatory Visit: Payer: Self-pay

## 2018-12-24 ENCOUNTER — Ambulatory Visit: Payer: Medicare HMO

## 2018-12-24 DIAGNOSIS — R2689 Other abnormalities of gait and mobility: Secondary | ICD-10-CM

## 2018-12-24 DIAGNOSIS — M6281 Muscle weakness (generalized): Secondary | ICD-10-CM

## 2018-12-24 DIAGNOSIS — R293 Abnormal posture: Secondary | ICD-10-CM

## 2018-12-24 NOTE — Patient Instructions (Signed)
Access Code: [email or web address]  URL: [email or web address]  Date: 12/24/2018  Prepared by: Sigurd Sos   Exercises  Seated Long Arc Quad - 10 reps - 2 sets - 3 hold - 2x daily - 7x weekly Seated Isometric Hip Adduction with Ball - 10 reps - 2 sets - 5 hold - 2x daily - 7x weekly Seated Hip Abduction with Resistance - 10 reps - 2 sets - 2x daily                            - 7x weekly

## 2018-12-24 NOTE — Therapy (Signed)
[facility] [address], [place], [place], [address] Phone: [phone]   Fax:  [phone]  Physical Therapy Treatment  Patient Details  Name: Seth Mccall MRN: [id number] Date of Birth: July 04, 1929 Referring Provider (PT): Magnus Sinning, MD   Encounter Date: 12/24/2018  PT End of Session - 12/24/18 1658    Visit Number  4    Date for PT Re-Evaluation  02/02/19    Authorization Type  Aetna Medicare    PT Start Time  1618    PT Stop Time  1658    PT Time Calculation (min)  40 min    Activity Tolerance  Patient tolerated treatment well    Behavior During Therapy  [facility] for tasks assessed/performed       Past Medical History:  Diagnosis Date  . Arthritis   . Complication of anesthesia    limited movement in neck because of fusion  . Gout   . Hypertension   . Prostate cancer [facility])     Past Surgical History:  Procedure Laterality Date  . BACK SURGERY  2005   fusion of C3, C4, and C5  . CATARACT EXTRACTION Bilateral   . CORONARY ATHERECTOMY N/A 08/12/2017   Procedure: CORONARY ATHERECTOMY;  Surgeon: Adrian Prows, MD;  Location: [place] [facility];  Service: Cardiovascular;  Laterality: N/A;  . CORONARY STENT INTERVENTION N/A 08/12/2017   Procedure: CORONARY STENT INTERVENTION;  Surgeon: Adrian Prows, MD;  Location: [place] [facility];  Service: Cardiovascular;  Laterality: N/A;  . GREEN LIGHT LASER TURP (TRANSURETHRAL RESECTION OF PROSTATE N/A 10/10/2012   Procedure: GREEN LIGHT LASER TURP (TRANSURETHRAL RESECTION OF PROSTATE;  Surgeon: Claybon Jabs, MD;  [facility];  Service: Urology;  Laterality: N/A;  . LEFT HEART CATH AND CORONARY ANGIOGRAPHY N/A 08/12/2017   Procedure: LEFT HEART CATH AND CORONARY ANGIOGRAPHY;  Surgeon: Adrian Prows, MD;  Location: [place] [facility];  Service: Cardiovascular;  Laterality: N/A;  . PROSTATE BIOPSY    . TRANSURETHRAL RESECTION OF PROSTATE  10-10-2012    There were  no vitals filed for this visit.  Subjective Assessment - 12/24/18 1614    Subjective  I think we over did it last session.  I was sore and had to take Tylenol.  I feel better now.    Currently in Pain?  No/denies                       [facility] Adult PT Treatment/Exercise - 12/24/18 0001      Ambulation/Gait   Pre-Gait Activities  weightshifting in standing with UE support to simulate taking a step      Lumbar Exercises: Seated   Other Seated Lumbar Exercises  row: yellow band 2x10      Knee/Hip Exercises: Aerobic   Nustep  Level 1 x 10 minutes    PT present to monitor for fatigue     Knee/Hip Exercises: Standing   Hip Flexion  Stengthening;Both;2 sets;10 reps;Knee bent   stepping over a pool noodle     Knee/Hip Exercises: Seated   Long Arc Quad  Both;Strengthening;2 sets;10 reps    Ball Squeeze  x 20    Marching  Strengthening;Both;10 reps    Marching Limitations  march with abduction over pool noodle to simulate clearing leg over tub x20    Abduction/Adduction   Strengthening;Both;20 reps    Abd/Adduction Limitations  red band     Sit to [employer]  2 sets;5  reps;with UE support   close stand by assistance and verbal cues            PT Education - 12/24/18 1641    Education Details  Access Code: [email or web address]    Person(s) Educated  Patient    Methods  Explanation;Demonstration;Handout    Comprehension  Returned demonstration;Verbalized understanding       PT Short Term Goals - 12/08/18 2129      PT SHORT TERM GOAL #1   Title  Pt will be ind in intial HEP    Time  3    Period  Weeks    Status  New    Target Date  12/29/18      PT SHORT TERM GOAL #2   Title  Pt will ambulate at least 60 feet with step to or step through gait pattern with single point cane without shuffling Rt LE at least 50% of the time.    Time  4    Period  Weeks    Status  New    Target Date  01/05/19      PT SHORT TERM GOAL #3   Title  Pt will perform sit to stand with UEs as  needed using proper mechanics, symmetry and no LOB without use of momentum x 5 reps.    Time  4    Period  Weeks    Status  New    Target Date  01/05/19        PT Long Term Goals - 12/08/18 2136      PT LONG TERM GOAL #1   Title  Pt will be safe and independent with advanced HEP.    Time  8    Period  Weeks    Status  New    Target Date  02/02/19      PT LONG TERM GOAL #2   Title  Pt will achieve community distance ambulation with more erect posture with proper advancement of Rt LE for step to or step through pattern (no shuffling) at least 75% of the time, using rollator or single point cane.    Time  8    Period  Weeks    Status  New    Target Date  02/02/19      PT LONG TERM GOAL #3   Title  Pt will improve TUG time to </= 40 sec to demo improved functional mobility and less fall risk.    Baseline  51    Time  8    Period  Weeks    Status  New    Target Date  02/02/19      PT LONG TERM GOAL #4   Title  Pt will improve 5x sit to stand to </= 25 sec to demo improved functional mobility and less fall risk.    Baseline  33    Time  8    Period  Weeks    Status  New    Target Date  02/02/19      PT LONG TERM GOAL #5   Title  Pt will improve LE strength to at least 4+/5 throughout bil LEs to improve safety and functional mobility for daily tasks such as getting in/out of tub.    Time  8    Period  Weeks    Status  New    Target Date  02/02/19            Plan - 12/24/18 1622    Clinical  Impression Statement  Pt demonstrates improved Rt hip flexion and ability to clear an object on the floor when seated this week.  Resistance on NuStep was reduced to Level 1 due to soreness after last session.  Pt requires close supervision with intermittent min assistance for transfers.  PT monitored pt closely with exercise to ensure that he doesn't experience as much soreness after this session.  Pt will continue to benefit from skilled PT to address strength, endurance and allow  for independence with bath transfers.    Rehab Potential  Good    PT Frequency  2x / week    PT Duration  8 weeks    PT Treatment/Interventions  ADLs/Self Care Home Management;Electrical Stimulation;Cryotherapy;Moist Heat;DME Instruction;Gait training;Stair training;Functional mobility training;Therapeutic activities;Therapeutic exercise;Balance training;Neuromuscular re-education;Patient/family education;Manual techniques;Passive range of motion;Spinal Manipulations;Joint Manipulations;Energy conservation;Dry needling    PT Next Visit Plan  update HEP as needed once pt is not longer too sore after PT session.  gait training for Rt LE advancing w/o shuffling, sit to stand, LE and trunk strength    PT Home Exercise Plan  Access Code: [email or web address]    Consulted and Agree with Plan of Care  Patient       Patient will benefit from skilled therapeutic intervention in order to improve the following deficits and impairments:  Abnormal gait, Decreased coordination, Difficulty walking, Decreased range of motion, Decreased endurance, Decreased activity tolerance, Decreased balance, Hypomobility, Impaired flexibility, Improper body mechanics, Decreased mobility, Decreased strength, Postural dysfunction  Visit Diagnosis: 1. Abnormal posture   2. Other abnormalities of gait and mobility   3. Muscle weakness (generalized)        Problem List Patient Active Problem List   Diagnosis Date Noted  . Unstable angina ([place]) 08/10/2017  . Candida infection 08/30/2015  . Malignant neoplasm of prostate ([place]) 04/28/2015  . BPH (benign prostatic hypertrophy) with urinary obstruction 10/09/2012     Sigurd Sos, PT 12/24/18 5:00 PM  [facility] [address], [place], [place], [address] Phone: [phone]   Fax:  [phone]  Name: Seth Mccall MRN: [id number] Date of Birth: Sep 16, 1929

## 2018-12-29 ENCOUNTER — Encounter: Payer: Self-pay | Admitting: Physical Therapy

## 2018-12-29 ENCOUNTER — Ambulatory Visit: Payer: Medicare HMO | Admitting: Physical Therapy

## 2018-12-29 ENCOUNTER — Other Ambulatory Visit: Payer: Self-pay

## 2018-12-29 DIAGNOSIS — M6281 Muscle weakness (generalized): Secondary | ICD-10-CM | POA: Diagnosis not present

## 2018-12-29 DIAGNOSIS — R2689 Other abnormalities of gait and mobility: Secondary | ICD-10-CM

## 2018-12-29 DIAGNOSIS — R293 Abnormal posture: Secondary | ICD-10-CM

## 2018-12-29 NOTE — Therapy (Signed)
[facility] [address], [place], [place], [address] Phone: [phone]   Fax:  [phone]  Physical Therapy Treatment  Patient Details  Name: Seth Mccall MRN: [id number] Date of Birth: [birth year] Referring Provider (PT): Magnus Sinning, MD   Encounter Date: 12/29/2018  PT End of Session - 12/29/18 1628    Visit Number  5    Date for PT Re-Evaluation  02/02/19    Authorization Type  [employer]    PT Start Time  5056    PT Stop Time  1625    PT Time Calculation (min)  40 min    Equipment Utilized During Treatment  Gait belt    Activity Tolerance  Patient tolerated treatment well    Behavior During Therapy  [facility] for tasks assessed/performed       Past Medical History:  Diagnosis Date  . Arthritis   . Complication of anesthesia    limited movement in neck because of fusion  . Gout   . Hypertension   . Prostate cancer [facility])     Past Surgical History:  Procedure Laterality Date  . BACK SURGERY  2005   fusion of C3, C4, and C5  . CATARACT EXTRACTION Bilateral   . CORONARY ATHERECTOMY N/A 08/12/2017   Procedure: CORONARY ATHERECTOMY;  Surgeon: Adrian Prows, MD;  Location: [place] [facility];  Service: Cardiovascular;  Laterality: N/A;  . CORONARY STENT INTERVENTION N/A 08/12/2017   Procedure: CORONARY STENT INTERVENTION;  Surgeon: Adrian Prows, MD;  Location: [place] [facility];  Service: Cardiovascular;  Laterality: N/A;  . GREEN LIGHT LASER TURP (TRANSURETHRAL RESECTION OF PROSTATE N/A 10/10/2012   Procedure: GREEN LIGHT LASER TURP (TRANSURETHRAL RESECTION OF PROSTATE;  Surgeon: Claybon Jabs, MD;  Location: [facility];  Service: Urology;  Laterality: N/A;  . LEFT HEART CATH AND CORONARY ANGIOGRAPHY N/A 08/12/2017   Procedure: LEFT HEART CATH AND CORONARY ANGIOGRAPHY;  Surgeon: Adrian Prows, MD;  Location: [place] [facility];  Service: Cardiovascular;  Laterality: N/A;  . PROSTATE BIOPSY    .  TRANSURETHRAL RESECTION OF PROSTATE  10-10-2012    There were no vitals filed for this visit.  Subjective Assessment - 12/29/18 1543    Subjective  Pt reported some improvement in ease with dressing.  Did better after last visit.  Didn't get too sore.    Pertinent History  fall risk, worsening scoliosis, prostate cancer, heart surgeries, cervical fusion x 3 levels    How long can you sit comfortably?  unlimited    How long can you stand comfortably?  30 min    How long can you walk comfortably?  100 feet    Diagnostic tests  xrays 11/2018, progression of scoliosis as compared to 2016, lumbar multilevel DDD, facet arthoropathy    Patient Stated Goals  get stronger, walk greater distances, get in/out of tub without substitutions for weak leg, correct trunk lean to Rt    Currently in Pain?  No/denies                       [facility] Adult PT Treatment/Exercise - 12/29/18 0001      Self-Care   Self-Care  Other Self-Care Comments    Other Self-Care Comments   getting in and out of bathtub strategies      Exercises   Exercises  Lumbar;Knee/Hip      Lumbar Exercises: Aerobic   Nustep  Seat 7, level 1 x 10 min  Knee/Hip Exercises: Standing   Hip Flexion  Stengthening;Both;Knee bent;20 reps    Hip Flexion Limitations  toe tap edge of treadmill bil UE support and close CGA by PT      Knee/Hip Exercises: Seated   Long Arc Quad  Strengthening;Both;2 sets;10 reps    [employer]  x 20    Marching  Strengthening;Both;10 reps    Marching Limitations  march with abduction over pool noodle to simulate clearing leg over tub x20    Abduction/Adduction   Strengthening;Both;20 reps    Abd/Adduction Limitations  red band     Sit to [employer]  2 sets;5 reps;without UE support   close CGA and gait belt used              PT Short Term Goals - 12/08/18 2129      PT SHORT TERM GOAL #1   Title  Pt will be ind in intial HEP    Time  3    Period  Weeks    Status  New    Target  Date  12/29/18      PT SHORT TERM GOAL #2   Title  Pt will ambulate at least 60 feet with step to or step through gait pattern with single point cane without shuffling Rt LE at least 50% of the time.    Time  4    Period  Weeks    Status  New    Target Date  01/05/19      PT SHORT TERM GOAL #3   Title  Pt will perform sit to stand with UEs as needed using proper mechanics, symmetry and no LOB without use of momentum x 5 reps.    Time  4    Period  Weeks    Status  New    Target Date  01/05/19        PT Long Term Goals - 12/08/18 2136      PT LONG TERM GOAL #1   Title  Pt will be safe and independent with advanced HEP.    Time  8    Period  Weeks    Status  New    Target Date  02/02/19      PT LONG TERM GOAL #2   Title  Pt will achieve community distance ambulation with more erect posture with proper advancement of Rt LE for step to or step through pattern (no shuffling) at least 75% of the time, using rollator or single point cane.    Time  8    Period  Weeks    Status  New    Target Date  02/02/19      PT LONG TERM GOAL #3   Title  Pt will improve TUG time to </= 40 sec to demo improved functional mobility and less fall risk.    Baseline  51    Time  8    Period  Weeks    Status  New    Target Date  02/02/19      PT LONG TERM GOAL #4   Title  Pt will improve 5x sit to stand to </= 25 sec to demo improved functional mobility and less fall risk.    Baseline  33    Time  8    Period  Weeks    Status  New    Target Date  02/02/19      PT LONG TERM GOAL #5   Title  Pt will improve  LE strength to at least 4+/5 throughout bil LEs to improve safety and functional mobility for daily tasks such as getting in/out of tub.    Time  8    Period  Weeks    Status  New    Target Date  02/02/19            Plan - 12/29/18 1628    Clinical Impression Statement  Pt continues to need min assist and close CGA for sit to stand and with gait tasks.  He has trouble advancing  Rt LE.  He reported improved ease with dressing LEs today since starting PT so feels this is some progress.  His main goal of stepping over edge of bathtub is an ongoing strategy PT is working with him on.  He purchased a thick bath mat to make himself taller on the bathtub side to see if this helps - he hasn't tried it yet.  Pt is quick to fatigue and needs seated exercises to break up those in standing.  He will continue to benefit from skilled PT along current POC.    PT Frequency  2x / week    PT Duration  8 weeks    PT Treatment/Interventions  ADLs/Self Care Home Management;Electrical Stimulation;Cryotherapy;Moist Heat;DME Instruction;Gait training;Stair training;Functional mobility training;Therapeutic activities;Therapeutic exercise;Balance training;Neuromuscular re-education;Patient/family education;Manual techniques;Passive range of motion;Spinal Manipulations;Joint Manipulations;Energy conservation;Dry needling    PT Next Visit Plan  update HEP and reinforce importance of it,  gait training for Rt LE advancing w/o shuffling, sit to stand, LE and trunk strength    PT Home Exercise Plan  Access Code: [email or web address]    Consulted and Agree with Plan of Care  Patient    Family Member Consulted  adult daughter       Patient will benefit from skilled therapeutic intervention in order to improve the following deficits and impairments:     Visit Diagnosis: 1. Abnormal posture   2. Other abnormalities of gait and mobility   3. Muscle weakness (generalized)        Problem List Patient Active Problem List   Diagnosis Date Noted  . Unstable angina ([place]) 08/10/2017  . Candida infection 08/30/2015  . Malignant neoplasm of prostate ([place]) 04/28/2015  . BPH (benign prostatic hypertrophy) with urinary obstruction 10/09/2012    Baruch Merl, PT 12/29/18 4:33 PM   [facility] [address], [place], [place], [address] Phone:  [phone]   Fax:  [phone]  Name: Seth Mccall MRN: [id number] Date of Birth: 04/28/1930

## 2018-12-31 ENCOUNTER — Ambulatory Visit: Payer: Medicare HMO | Admitting: Physical Therapy

## 2018-12-31 ENCOUNTER — Encounter: Payer: Self-pay | Admitting: Physical Therapy

## 2018-12-31 ENCOUNTER — Other Ambulatory Visit: Payer: Self-pay

## 2018-12-31 DIAGNOSIS — M6281 Muscle weakness (generalized): Secondary | ICD-10-CM | POA: Diagnosis not present

## 2018-12-31 DIAGNOSIS — R2689 Other abnormalities of gait and mobility: Secondary | ICD-10-CM

## 2018-12-31 DIAGNOSIS — R293 Abnormal posture: Secondary | ICD-10-CM

## 2018-12-31 NOTE — Patient Instructions (Signed)
Access Code: [email or web address]  URL: [email or web address]  Date: 12/31/2018  Prepared by: Venetia Night Yuma Pacella   Exercises  Seated March - 10 reps - 3 sets - 1x daily - 7x weekly  Sit to Stand with Armchair - 10 reps - 2 sets - 1x daily - 7x weekly  Seated Scapular Retraction - 10 reps - 3 sets - 1x daily - 7x weekly  Seated Transversus Abdominis Bracing - 10 reps - 2 sets - 10 hold - 1x daily - 7x weekly  Standing March with Counter Support - 20 reps - 2 sets - 1x daily - 7x weekly  Seated Long Arc Quad - 10 reps - 2 sets - 3 hold - 2x daily - 7x weekly  Seated Isometric Hip Adduction with Ball - 10 reps - 2 sets - 5 hold - 2x daily - 7x weekly  Seated Hip Abduction with Resistance - 10 reps - 2 sets - 2x daily - 7x weekly  Seated Shoulder Flexion Full Range - 5 reps - 2 sets - 1x daily - 7x weekly  Seated Hamstring Stretch - 2 reps - 2 sets - 30 hold - 1x daily - 7x weekly

## 2018-12-31 NOTE — Therapy (Signed)
[facility] [address], [place], [place], [address] Phone: [phone]   Fax:  [phone]  Physical Therapy Treatment  Patient Details  Name: Seth Mccall MRN: [id number] Date of Birth: 1929-07-08 Referring Provider (PT): Magnus Sinning, MD   Encounter Date: 12/31/2018  PT End of Session - 12/31/18 1715    Visit Number  6    Date for PT Re-Evaluation  02/02/19    Authorization Type  [employer]na Medicare    PT Start Time  1624    PT Stop Time  1705    PT Time Calculation (min)  41 min    Equipment Utilized During Treatment  Gait belt    Activity Tolerance  Patient tolerated treatment well    Behavior During Therapy  [facility] for tasks assessed/performed       Past Medical History:  Diagnosis Date  . Arthritis   . Complication of anesthesia    limited movement in neck because of fusion  . Gout   . Hypertension   . Prostate cancer [facility])     Past Surgical History:  Procedure Laterality Date  . BACK SURGERY  2005   fusion of C3, C4, and C5  . CATARACT EXTRACTION Bilateral   . CORONARY ATHERECTOMY N/A 08/12/2017   Procedure: CORONARY ATHERECTOMY;  Surgeon: Adrian Prows, MD;  Location: [place] [facility];  Service: Cardiovascular;  Laterality: N/A;  . CORONARY STENT INTERVENTION N/A 08/12/2017   Procedure: CORONARY STENT INTERVENTION;  Surgeon: Adrian Prows, MD;  Location: [place] [facility];  Service: Cardiovascular;  Laterality: N/A;  . GREEN LIGHT LASER TURP (TRANSURETHRAL RESECTION OF PROSTATE N/A 10/10/2012   Procedure: GREEN LIGHT LASER TURP (TRANSURETHRAL RESECTION OF PROSTATE;  Surgeon: Claybon Jabs, MD;  [facility];  Service: Urology;  Laterality: N/A;  . LEFT HEART CATH AND CORONARY ANGIOGRAPHY N/A 08/12/2017   Procedure: LEFT HEART CATH AND CORONARY ANGIOGRAPHY;  Surgeon: Adrian Prows, MD;  Location: [place] [facility];  Service: Cardiovascular;  Laterality: N/A;  . PROSTATE BIOPSY    .  TRANSURETHRAL RESECTION OF PROSTATE  10-10-2012    There were no vitals filed for this visit.  Subjective Assessment - 12/31/18 1642    Subjective  I did well after last visit.  I do not do all the exercises between visits.  I was able to get into the tub on my own using my new 2" mat inside the tub to make me taller.    Pertinent History  fall risk, worsening scoliosis, prostate cancer, heart surgeries, cervical fusion x 3 levels    Limitations  Standing;Walking    How long can you sit comfortably?  unlimited    How long can you stand comfortably?  30 min    How long can you walk comfortably?  100 feet    Diagnostic tests  xrays 11/2018, progression of scoliosis as compared to 2016, lumbar multilevel DDD, facet arthoropathy    Patient Stated Goals  get stronger, walk greater distances, get in/out of tub without substitutions for weak leg, correct trunk lean to Rt    Currently in Pain?  No/denies                       [facility] Adult PT Treatment/Exercise - 12/31/18 0001      Exercises   Exercises  Lumbar;Knee/Hip      Lumbar Exercises: Stretches   Active Hamstring Stretch  Left;Right;30 seconds    Active Hamstring  Stretch Limitations  seated, PT cued hip hinge and sitting tall to avoid further kyphosis    Other Lumbar Stretch Exercise  ankle DF/PF seated feet on half foam roller x 20      Lumbar Exercises: Aerobic   Nustep  Seat 7, level 1 x 10 min   PT present to discuss progress     Lumbar Exercises: Seated   Long Arc Quad on Chair  Strengthening;Both;2 sets;15 reps    LAQ on Chair Limitations  tried adding red band but Rt LE too weak    Other Seated Lumbar Exercises  scapular squeezes    Other Seated Lumbar Exercises  seated bil shoulder flexion with inhalation and cue to sit tall, exhale return arms to sides 2x5 (added to HEP)      Knee/Hip Exercises: Seated   Ball Squeeze  x 20    Marching  Strengthening;Both;20 reps    Abduction/Adduction    Strengthening;Both;20 reps    Abd/Adduction Limitations  red band     Sit to [employer]  2 sets;5 reps;without UE support   close CGA and gait belt used            PT Education - 12/31/18 1659    Education Details  Access Code: [email or web address]    Person(s) Educated  Patient    Methods  Explanation;Demonstration;Verbal cues;Handout    Comprehension  Verbalized understanding;Returned demonstration       PT Short Term Goals - 12/31/18 1721      PT SHORT TERM GOAL #1   Title  Pt will be ind in intial HEP    Baseline  working on compliance    Status  On-going      PT SHORT TERM GOAL #2   Title  Pt will ambulate at least 60 feet with step to or step through gait pattern with rollator without shuffling Rt LE at least 50% of the time.    Baseline  revised from gait with single point cane    Status  Revised      PT SHORT TERM GOAL #3   Title  Pt will perform sit to stand with UEs as needed using proper mechanics, symmetry and no LOB without use of momentum x 5 reps.    Status  On-going        PT Long Term Goals - 12/08/18 2136      PT LONG TERM GOAL #1   Title  Pt will be safe and independent with advanced HEP.    Time  8    Period  Weeks    Status  New    Target Date  02/02/19      PT LONG TERM GOAL #2   Title  Pt will achieve community distance ambulation with more erect posture with proper advancement of Rt LE for step to or step through pattern (no shuffling) at least 75% of the time, using rollator or single point cane.    Time  8    Period  Weeks    Status  New    Target Date  02/02/19      PT LONG TERM GOAL #3   Title  Pt will improve TUG time to </= 40 sec to demo improved functional mobility and less fall risk.    Baseline  51    Time  8    Period  Weeks    Status  New    Target Date  02/02/19      PT LONG TERM GOAL #  4   Title  Pt will improve 5x sit to stand to </= 25 sec to demo improved functional mobility and less fall risk.    Baseline  33    Time  8     Period  Weeks    Status  New    Target Date  02/02/19      PT LONG TERM GOAL #5   Title  Pt will improve LE strength to at least 4+/5 throughout bil LEs to improve safety and functional mobility for daily tasks such as getting in/out of tub.    Time  8    Period  Weeks    Status  New    Target Date  02/02/19            Plan - 12/31/18 1716    Clinical Impression Statement  Pt reported he was able to achieve his goal of transfering into tub independently with use of a foam mat in tub to make him taller to get Rt LE over edge of tub.  PT reviewed and updated seated HEP today, adding hamstring stretch and bil shoulder flexion with inhalation for ribcage/lung expansion and thoracic extension.  PT encouraged Pt to perform entire HEP daily on days he doesn't have PT, even if he needs to spread the program out throughout the day due to fatigue.  PT asked Pt to bring rollator next visit to do more gait training as he is unable to tolerate much gait training with his cane.    Comorbidities  fall risk, progressive scoliosis    Rehab Potential  Good    PT Frequency  2x / week    PT Duration  8 weeks    PT Treatment/Interventions  ADLs/Self Care Home Management;Electrical Stimulation;Cryotherapy;Moist Heat;DME Instruction;Gait training;Stair training;Functional mobility training;Therapeutic activities;Therapeutic exercise;Balance training;Neuromuscular re-education;Patient/family education;Manual techniques;Passive range of motion;Spinal Manipulations;Joint Manipulations;Energy conservation;Dry needling    PT Next Visit Plan  f/u on HEP compliance, tub transfers, gait training with rollator, transfers, add standing ther ex as tolerated, UE resistance/thoracic extension    PT Home Exercise Plan  Access Code: [email or web address]    Consulted and Agree with Plan of Care  Patient;Family member/caregiver    Family Member Consulted  adult daughter       Patient will benefit from skilled therapeutic intervention  in order to improve the following deficits and impairments:  Abnormal gait, Decreased coordination, Difficulty walking, Decreased range of motion, Decreased endurance, Decreased activity tolerance, Decreased balance, Hypomobility, Impaired flexibility, Improper body mechanics, Decreased mobility, Decreased strength, Postural dysfunction  Visit Diagnosis: 1. Abnormal posture   2. Other abnormalities of gait and mobility   3. Muscle weakness (generalized)        Problem List Patient Active Problem List   Diagnosis Date Noted  . Unstable angina ([place]) 08/10/2017  . Candida infection 08/30/2015  . Malignant neoplasm of prostate ([place]) 04/28/2015  . BPH (benign prostatic hypertrophy) with urinary obstruction 10/09/2012    Baruch Merl, PT 12/31/18 5:25 PM   [facility] [address], [place], [place], [address] Phone: [phone]   Fax:  [phone]  Name: Seth Mccall MRN: [id number] Date of Birth: 1929/08/18

## 2019-01-05 ENCOUNTER — Encounter: Payer: Self-pay | Admitting: Physical Therapy

## 2019-01-05 ENCOUNTER — Ambulatory Visit: Payer: Medicare HMO | Admitting: Physical Therapy

## 2019-01-05 ENCOUNTER — Other Ambulatory Visit: Payer: Self-pay

## 2019-01-05 DIAGNOSIS — M6281 Muscle weakness (generalized): Secondary | ICD-10-CM | POA: Diagnosis not present

## 2019-01-05 DIAGNOSIS — R2689 Other abnormalities of gait and mobility: Secondary | ICD-10-CM

## 2019-01-05 DIAGNOSIS — R293 Abnormal posture: Secondary | ICD-10-CM

## 2019-01-05 NOTE — Therapy (Signed)
[facility] [address] [place], [place], [address] Phone: [phone]   Fax:  [phone]  Physical Therapy Treatment  Patient Details  Name: Seth Mccall MRN: [id number] Date of Birth: Oct 09, 1929 Referring Provider (PT): Magnus Sinning, MD   Encounter Date: 01/05/2019  PT End of Session - 01/05/19 1628    Visit Number  7    Date for PT Re-Evaluation  02/02/19    Authorization Type  [employer]    PT Start Time  9794    PT Stop Time  1623    PT Time Calculation (min)  38 min    Equipment Utilized During Treatment  Gait belt    Activity Tolerance  Patient tolerated treatment well    Behavior During Therapy  [facility] for tasks assessed/performed       Past Medical History:  Diagnosis Date  . Arthritis   . Complication of anesthesia    limited movement in neck because of fusion  . Gout   . Hypertension   . Prostate cancer [facility])     Past Surgical History:  Procedure Laterality Date  . BACK SURGERY  2005   fusion of C3, C4, and C5  . CATARACT EXTRACTION Bilateral   . CORONARY ATHERECTOMY N/A 08/12/2017   Procedure: CORONARY ATHERECTOMY;  Surgeon: Adrian Prows, MD;  Location: [place] [facility];  Service: Cardiovascular;  Laterality: N/A;  . CORONARY STENT INTERVENTION N/A 08/12/2017   Procedure: CORONARY STENT INTERVENTION;  Surgeon: Adrian Prows, MD;  Location: [place] [facility];  Service: Cardiovascular;  Laterality: N/A;  . GREEN LIGHT LASER TURP (TRANSURETHRAL RESECTION OF PROSTATE N/A 10/10/2012   Procedure: GREEN LIGHT LASER TURP (TRANSURETHRAL RESECTION OF PROSTATE;  Surgeon: Claybon Jabs, MD;  Location: [facility];  Service: Urology;  Laterality: N/A;  . LEFT HEART CATH AND CORONARY ANGIOGRAPHY N/A 08/12/2017   Procedure: LEFT HEART CATH AND CORONARY ANGIOGRAPHY;  Surgeon: Adrian Prows, MD;  Location: [place] [facility];  Service: Cardiovascular;  Laterality: N/A;  . PROSTATE BIOPSY    .  TRANSURETHRAL RESECTION OF PROSTATE  10-10-2012    There were no vitals filed for this visit.  Subjective Assessment - 01/05/19 1556    Subjective  Yesterday I had difficulty getting out of the bathtub. I have bough one of the foam pads.    Pertinent History  fall risk, worsening scoliosis, prostate cancer, heart surgeries, cervical fusion x 3 levels    Limitations  Standing;Walking    How long can you sit comfortably?  unlimited    How long can you stand comfortably?  30 min    How long can you walk comfortably?  100 feet    Diagnostic tests  xrays 11/2018, progression of scoliosis as compared to 2016, lumbar multilevel DDD, facet arthoropathy    Patient Stated Goals  get stronger, walk greater distances, get in/out of tub without substitutions for weak leg, correct trunk lean to Rt    Currently in Pain?  No/denies         [facility] PT Assessment - 01/05/19 0001      Assessment   Medical Diagnosis  M41.9 (ICD-10-CM) - Scoliosis, unspecified scoliosis type, unspecified spinal region, M21.70 (ICD-10-CM) - Leg length discrepancy, R53.1 (ICD-10-CM) - Weakness, M48.061 (ICD-10-CM) - Spinal stenosis of lumbar region without neurogenic claudication    Referring Provider (PT)  Magnus Sinning, MD      Strength   Right Hip Flexion  3+/5    Right Hip  External Rotation   4+/5    Right Hip Internal Rotation  4+/5    Left Hip Flexion  4-/5    Left Hip External Rotation  4+/5    Left Hip Internal Rotation  4+/5                   [facility] Adult PT Treatment/Exercise - 01/05/19 0001      Lumbar Exercises: Stretches   Active Hamstring Stretch  Left;Right;30 seconds    Active Hamstring Stretch Limitations  seated, PT cued hip hinge and sitting tall to avoid further kyphosis    Other Lumbar Stretch Exercise  ankle DF/PF seated feet on half foam roller x 30      Lumbar Exercises: Aerobic   Nustep  Seat 7, level 1 x 6 min; was not up to 10 minutes today   PT present to discuss progress      Lumbar Exercises: Seated   Long Arc Quad on Chair  Strengthening;Both;2 sets;15 reps    Other Seated Lumbar Exercises  scapular squeezes    Other Seated Lumbar Exercises  seated bil shoulder flexion with inhalation and cue to sit tall, exhale return arms to sides 2x5 (added to HEP)      Knee/Hip Exercises: Seated   Ball Squeeze  x 20    Marching  Strengthening;Both;20 reps    Sit to [employer]  2 sets;5 reps;without UE support   close CGA and gait belt used              PT Short Term Goals - 01/05/19 1631      PT SHORT TERM GOAL #1   Title  Pt will be ind in intial HEP    Time  3    Period  Weeks    Status  On-going    Target Date  12/29/18      PT SHORT TERM GOAL #2   Title  Pt will ambulate at least 60 feet with step to or step through gait pattern with rollator without shuffling Rt LE at least 50% of the time.    Baseline  revised from gait with single point cane    Time  4    Period  Weeks    Status  Revised      PT SHORT TERM GOAL #3   Title  Pt will perform sit to stand with UEs as needed using proper mechanics, symmetry and no LOB without use of momentum x 5 reps.    Time  4    Period  Weeks    Status  On-going    Target Date  01/05/19        PT Long Term Goals - 12/08/18 2136      PT LONG TERM GOAL #1   Title  Pt will be safe and independent with advanced HEP.    Time  8    Period  Weeks    Status  New    Target Date  02/02/19      PT LONG TERM GOAL #2   Title  Pt will achieve community distance ambulation with more erect posture with proper advancement of Rt LE for step to or step through pattern (no shuffling) at least 75% of the time, using rollator or single point cane.    Time  8    Period  Weeks    Status  New    Target Date  02/02/19      PT LONG TERM GOAL #3  Title  Pt will improve TUG time to </= 40 sec to demo improved functional mobility and less fall risk.    Baseline  51    Time  8    Period  Weeks    Status  New    Target Date   02/02/19      PT LONG TERM GOAL #4   Title  Pt will improve 5x sit to stand to </= 25 sec to demo improved functional mobility and less fall risk.    Baseline  33    Time  8    Period  Weeks    Status  New    Target Date  02/02/19      PT LONG TERM GOAL #5   Title  Pt will improve LE strength to at least 4+/5 throughout bil LEs to improve safety and functional mobility for daily tasks such as getting in/out of tub.    Time  8    Period  Weeks    Status  New    Target Date  02/02/19            Plan - 01/05/19 1628    Clinical Impression Statement  Patient reports he had more trouble getting in and out of the bathtub compared from the other day. Patient was only able to do 6 minutes on the nustep compared to 10 minutes. Patient has more difficulty with right leg to do the exericses. Patient needs help with the hamstring stretch. Patient will benefit from skilled therapy to improve strength and mobility.    Personal Factors and Comorbidities  Age;Comorbidity 1;Comorbidity 2    Comorbidities  fall risk, progressive scoliosis    Examination-Activity Limitations  Bathing;Locomotion Level;Transfers;Bed Mobility;Squat;Hygiene/Grooming;Toileting;Stand    Examination-Participation Restrictions  Meal Prep;Cleaning;Driving;Laundry    Stability/Clinical Decision Making  Evolving/Moderate complexity    Rehab Potential  Good    PT Frequency  2x / week    PT Duration  8 weeks    PT Treatment/Interventions  ADLs/Self Care Home Management;Electrical Stimulation;Cryotherapy;Moist Heat;DME Instruction;Gait training;Stair training;Functional mobility training;Therapeutic activities;Therapeutic exercise;Balance training;Neuromuscular re-education;Patient/family education;Manual techniques;Passive range of motion;Spinal Manipulations;Joint Manipulations;Energy conservation;Dry needling    PT Next Visit Plan  f/u on HEP compliance, tub transfers, gait training with rollator, transfers, add standing ther  ex as tolerated, UE resistance/thoracic extension    PT Home Exercise Plan  Access Code: [email or web address]    Consulted and Agree with Plan of Care  Patient;Family member/caregiver       Patient will benefit from skilled therapeutic intervention in order to improve the following deficits and impairments:  Abnormal gait, Decreased coordination, Difficulty walking, Decreased range of motion, Decreased endurance, Decreased activity tolerance, Decreased balance, Hypomobility, Impaired flexibility, Improper body mechanics, Decreased mobility, Decreased strength, Postural dysfunction  Visit Diagnosis: 1. Abnormal posture   2. Other abnormalities of gait and mobility   3. Muscle weakness (generalized)        Problem List Patient Active Problem List   Diagnosis Date Noted  . Unstable angina ([place]) 08/10/2017  . Candida infection 08/30/2015  . Malignant neoplasm of prostate ([place]) 04/28/2015  . BPH (benign prostatic hypertrophy) with urinary obstruction 10/09/2012    Earlie Counts, PT 01/05/19 4:32 PM   [facility] [address] [place], [place], [address] Phone: [phone]   Fax:  [phone]  Name: Seth Mccall MRN: [id number] Date of Birth: [birth year]

## 2019-01-07 ENCOUNTER — Encounter: Payer: Medicare HMO | Admitting: Physical Therapy

## 2019-01-08 ENCOUNTER — Encounter: Payer: Self-pay | Admitting: Physical Therapy

## 2019-01-08 ENCOUNTER — Other Ambulatory Visit: Payer: Self-pay

## 2019-01-08 ENCOUNTER — Ambulatory Visit: Payer: Medicare HMO | Admitting: Physical Therapy

## 2019-01-08 DIAGNOSIS — R2689 Other abnormalities of gait and mobility: Secondary | ICD-10-CM

## 2019-01-08 DIAGNOSIS — R293 Abnormal posture: Secondary | ICD-10-CM

## 2019-01-08 DIAGNOSIS — M6281 Muscle weakness (generalized): Secondary | ICD-10-CM

## 2019-01-08 NOTE — Therapy (Signed)
[facility] [address] [place], [place], [address] Phone: [phone]   Fax:  [phone]  Physical Therapy Treatment  Patient Details  Name: Seth Mccall MRN: [id number] Date of Birth: [birth year] Referring Provider (PT): Magnus Sinning, MD   Encounter Date: 01/08/2019  PT End of Session - 01/08/19 1634    Visit Number  8    Date for PT Re-Evaluation  02/02/19    Authorization Type  [employer]na Medicare    PT Start Time  1634    PT Stop Time  4034    PT Time Calculation (min)  41 min    Equipment Utilized During Treatment  Other (comment)   rollator   Activity Tolerance  Patient tolerated treatment well    Behavior During Therapy  [facility] for tasks assessed/performed       Past Medical History:  Diagnosis Date  . Arthritis   . Complication of anesthesia    limited movement in neck because of fusion  . Gout   . Hypertension   . Prostate cancer [facility])     Past Surgical History:  Procedure Laterality Date  . BACK SURGERY  2005   fusion of C3, C4, and C5  . CATARACT EXTRACTION Bilateral   . CORONARY ATHERECTOMY N/A 08/12/2017   Procedure: CORONARY ATHERECTOMY;  Surgeon: Adrian Prows, MD;  Location: [place] [facility];  Service: Cardiovascular;  Laterality: N/A;  . CORONARY STENT INTERVENTION N/A 08/12/2017   Procedure: CORONARY STENT INTERVENTION;  Surgeon: Adrian Prows, MD;  Location: [place] [facility];  Service: Cardiovascular;  Laterality: N/A;  . GREEN LIGHT LASER TURP (TRANSURETHRAL RESECTION OF PROSTATE N/A 10/10/2012   Procedure: GREEN LIGHT LASER TURP (TRANSURETHRAL RESECTION OF PROSTATE;  Surgeon: Claybon Jabs, MD;  [facility];  Service: Urology;  Laterality: N/A;  . LEFT HEART CATH AND CORONARY ANGIOGRAPHY N/A 08/12/2017   Procedure: LEFT HEART CATH AND CORONARY ANGIOGRAPHY;  Surgeon: Adrian Prows, MD;  Location: [place] [facility];  Service: Cardiovascular;  Laterality: N/A;  . PROSTATE BIOPSY     . TRANSURETHRAL RESECTION OF PROSTATE  10-10-2012    There were no vitals filed for this visit.  Subjective Assessment - 01/08/19 1636    Subjective  I still struggle with the Rt leg in/out of the bathtub.  I ordered a second mat to make the inside of tub even taller.  I usually bathe after the exerices so maybe I'm too fatigued to get my leg in.  I am doing the exercises every day, once a day.    Pertinent History  fall risk, worsening scoliosis, prostate cancer, heart surgeries, cervical fusion x 3 levels    How long can you sit comfortably?  unlimited    How long can you stand comfortably?  30 min    How long can you walk comfortably?  100 feet    Diagnostic tests  xrays 11/2018, progression of scoliosis as compared to 2016, lumbar multilevel DDD, facet arthoropathy    Patient Stated Goals  get stronger, walk greater distances, get in/out of tub without substitutions for weak leg, correct trunk lean to Rt    Currently in Pain?  No/denies                       [facility] Adult PT Treatment/Exercise - 01/08/19 0001      Ambulation/Gait   Assistive device  Rollator    Gait Pattern  Step-through pattern;Step-to pattern  Pre-Gait Activities  weight shifting side to side with tactile cue to engage quads    Gait Comments  3x160 feet with rollator, PT cued Pt to squeeze Rt knee in weight bearing and bend knee in swing phase, "big quiet steps"      Self-Care   Other Self-Care Comments   timing of bathing, rest before tub transfer to have more success with Rt leg transfer, increase seated HEP to 2x/daily       Lumbar Exercises: Aerobic   Nustep  Seat 7, L1 x 6', PT present to discuss progress and HEP      Knee/Hip Exercises: Seated   Long Arc Quad  Strengthening;Both;20 reps;Weights    Long Arc Quad Weight  1 lbs.      Knee/Hip Exercises: Supine   Short Arc Quad Sets  Strengthening;Right;15 reps;2 sets    Short Arc Quad Sets Limitations  propped on wedge, thick towel roll  under knee    Heel Slides  AROM;Right;10 reps    Heel Slides Limitations  propped on wedge             PT Education - 01/08/19 1731    Education Details  Access Code: [email or web address]    Person(s) Educated  Patient;Child(ren)    Methods  Explanation;Verbal cues;Handout    Comprehension  Verbalized understanding;Returned demonstration       PT Short Term Goals - 01/08/19 1731      PT SHORT TERM GOAL #1   Title  Pt will be ind in intial HEP    Status  Achieved      PT SHORT TERM GOAL #2   Title  Pt will ambulate at least 60 feet with step to or step through gait pattern with rollator without shuffling Rt LE at least 50% of the time.    Baseline  160 feet, at least 75% without shuffling    Status  Achieved      PT SHORT TERM GOAL #3   Title  Pt will perform sit to stand with UEs as needed using proper mechanics, symmetry and no LOB without use of momentum x 5 reps.    Status  On-going        PT Long Term Goals - 01/08/19 1732      PT LONG TERM GOAL #1   Title  Pt will be safe and independent with advanced HEP.    Status  On-going      PT LONG TERM GOAL #2   Title  Pt will achieve community distance ambulation with more erect posture with proper advancement of Rt LE for step to or step through pattern (no shuffling) at least 75% of the time, using rollator or single point cane.    Baseline  working on posture, tolerates 160 feet at this time with 25-50% shuffling depending on fatigue levels    Status  On-going            Plan - 01/08/19 1727    Clinical Impression Statement  Pt ordered a second mat to see if this helps him get in/out of tub more easily.  PT discussed adding "walking to walk" through house to fatigue 3x/day vs just household task specific walking to improve strength and endurance.  PT also encouraged him to rest before bathing to see if this improves transfer success as he has been bathing immediately following HEP when he is very fatigued.  PT added  supine heel slides and terminal quad sets with towel roll to improve  knee ROM/quad activation for gait.  Pt will continue to benefit from skilled PT along current POC.    Comorbidities  fall risk, progressive scoliosis    Examination-Activity Limitations  Bathing;Locomotion Level;Transfers;Bed Mobility;Squat;Hygiene/Grooming;Toileting;Stand    Rehab Potential  Good    PT Frequency  2x / week    PT Duration  8 weeks    PT Treatment/Interventions  ADLs/Self Care Home Management;Electrical Stimulation;Cryotherapy;Moist Heat;DME Instruction;Gait training;Stair training;Functional mobility training;Therapeutic activities;Therapeutic exercise;Balance training;Neuromuscular re-education;Patient/family education;Manual techniques;Passive range of motion;Spinal Manipulations;Joint Manipulations;Energy conservation;Dry needling    PT Next Visit Plan  f/u on updated HEP (supine exs), compliance with increased household walking for exercise, cont gait, weight shifting, add standing ther ex as tol, seated LE strength, postural strength    PT Home Exercise Plan  Access Code: [email or web address]    Consulted and Agree with Plan of Care  Patient;Family member/caregiver    Family Member Consulted  adult daughter       Patient will benefit from skilled therapeutic intervention in order to improve the following deficits and impairments:     Visit Diagnosis: Abnormal posture  Other abnormalities of gait and mobility  Muscle weakness (generalized)     Problem List Patient Active Problem List   Diagnosis Date Noted  . Unstable angina ([place]) 08/10/2017  . Candida infection 08/30/2015  . Malignant neoplasm of prostate ([place]) 04/28/2015  . BPH (benign prostatic hypertrophy) with urinary obstruction 10/09/2012    Baruch Merl, PT 01/08/19 5:36 PM   [facility] [address] [place], [place], [address] Phone: [phone]   Fax:   [phone]  Name: Seth Mccall MRN: [id number] Date of Birth: [birth year]

## 2019-01-08 NOTE — Patient Instructions (Signed)
Access Code: [email or web address], added quad sets with towel roll and heel slides supine, "walking to walk" in household for increased gait endurance and strength

## 2019-01-09 ENCOUNTER — Other Ambulatory Visit: Payer: Self-pay

## 2019-01-09 ENCOUNTER — Ambulatory Visit
Admission: RE | Admit: 2019-01-09 | Discharge: 2019-01-09 | Disposition: A | Discharge status: Home / Self Care | Payer: Medicare HMO | Source: Ambulatory Visit | Attending: Physical Medicine and Rehabilitation | Admitting: Physical Medicine and Rehabilitation

## 2019-01-09 DIAGNOSIS — M5136 Other intervertebral disc degeneration, lumbar region: Secondary | ICD-10-CM

## 2019-01-09 DIAGNOSIS — M4807 Spinal stenosis, lumbosacral region: Secondary | ICD-10-CM

## 2019-01-12 ENCOUNTER — Encounter: Payer: Self-pay | Admitting: Physical Therapy

## 2019-01-12 ENCOUNTER — Other Ambulatory Visit: Payer: Self-pay

## 2019-01-12 ENCOUNTER — Telehealth: Payer: Self-pay | Admitting: Physical Medicine and Rehabilitation

## 2019-01-12 ENCOUNTER — Ambulatory Visit: Payer: Medicare HMO | Admitting: Physical Therapy

## 2019-01-12 DIAGNOSIS — M6281 Muscle weakness (generalized): Secondary | ICD-10-CM

## 2019-01-12 DIAGNOSIS — R293 Abnormal posture: Secondary | ICD-10-CM

## 2019-01-12 DIAGNOSIS — R2689 Other abnormalities of gait and mobility: Secondary | ICD-10-CM

## 2019-01-12 NOTE — Telephone Encounter (Signed)
-----   Message from Magnus Sinning, MD sent at 01/12/2019  1:01 PM EDT ----- Regarding: MRI Please call Pt or his daughter and let them know that MRI mainly showed pretty significant arthritis at L4-5 with small slippage but otherwise no focal nerve compression or narrowing that would explain weakness.

## 2019-01-12 NOTE — Therapy (Signed)
[facility] [address], [address] [place], [place], [address] Phone: [phone]   Fax:  [phone]  Physical Therapy Treatment  Patient Details  Name: Seth Mccall MRN: [id number] Date of Birth: 05-Sep-1929 Referring Provider (PT): Tyrell Antonio, MD   Encounter Date: 01/12/2019  PT End of Session - 01/12/19 1719    Visit Number  9    Date for PT Re-Evaluation  02/02/19    Authorization Type  [employer]    PT Start Time  1630    PT Stop Time  1715    PT Time Calculation (min)  45 min    Equipment Utilized During Treatment  Other (comment)   rollator   Activity Tolerance  Patient tolerated treatment well    Behavior During Therapy  [facility] for tasks assessed/performed       Past Medical History:  Diagnosis Date  . Arthritis   . Complication of anesthesia    limited movement in neck because of fusion  . Gout   . Hypertension   . Prostate cancer [facility])     Past Surgical History:  Procedure Laterality Date  . BACK SURGERY  2005   fusion of C3, C4, and C5  . CATARACT EXTRACTION Bilateral   . CORONARY ATHERECTOMY N/A 08/12/2017   Procedure: CORONARY ATHERECTOMY;  Surgeon: Yates Decamp, MD;  Location: [facility] INVASIVE CV LAB;  Service: Cardiovascular;  Laterality: N/A;  . CORONARY STENT INTERVENTION N/A 08/12/2017   Procedure: CORONARY STENT INTERVENTION;  Surgeon: Yates Decamp, MD;  Location: [facility] INVASIVE CV LAB;  Service: Cardiovascular;  Laterality: N/A;  . GREEN LIGHT LASER TURP (TRANSURETHRAL RESECTION OF PROSTATE N/A 10/10/2012   Procedure: GREEN LIGHT LASER TURP (TRANSURETHRAL RESECTION OF PROSTATE;  Surgeon: Garnett Farm, MD;  Location: [facility] ORS;  Service: Urology;  Laterality: N/A;  . LEFT HEART CATH AND CORONARY ANGIOGRAPHY N/A 08/12/2017   Procedure: LEFT HEART CATH AND CORONARY ANGIOGRAPHY;  Surgeon: Yates Decamp, MD;  Location: [facility] INVASIVE CV LAB;  Service: Cardiovascular;  Laterality: N/A;  . PROSTATE BIOPSY     . TRANSURETHRAL RESECTION OF PROSTATE  10-10-2012    There were no vitals filed for this visit.  Subjective Assessment - 01/12/19 1634    Subjective  I have been too busy with cooking some of our meals to do my extra walking in the house.  My Rt hand hurts constantly so that makes bathing difficult, but I am able to get right leg in tub better using double mats inside bath.    Pertinent History  fall risk, worsening scoliosis, prostate cancer, heart surgeries, cervical fusion x 3 levels    Limitations  Standing;Walking    How long can you sit comfortably?  unlimited    How long can you stand comfortably?  30 min    How long can you walk comfortably?  100 feet    Diagnostic tests  xrays 11/2018, progression of scoliosis as compared to 2016, lumbar multilevel DDD, facet arthoropathy    Patient Stated Goals  get stronger, walk greater distances, get in/out of tub without substitutions for weak leg, correct trunk lean to Rt    Currently in Pain?  No/denies         [facility] PT Assessment - 01/12/19 0001      Transfers   Five time sit to stand comments   15 sec with heavy use of arms bil, no weight transfer into feet, Pt unable to perform without arms on chair  Timed Up and Go Test   TUG  Normal TUG    Normal TUG (seconds)  54    TUG Comments  with rollator, slow turns and transfers, Rt LE difficulty with advancing during swing, shuffles or steps-to                   [facility] Adult PT Treatment/Exercise - 01/12/19 0001      Ambulation/Gait   Assistive device  Rollator    Gait Pattern  Step-to pattern;Step-through pattern    Pre-Gait Activities  stepping strategy placing Rt heel on 2" riser within rollator, PT cued hip flexion and ankle DF    Gait Comments  1x160 feet, 1x80 feet with PT cueing heel-toe pattern on Rt LE      Exercises   Exercises  Ankle      Lumbar Exercises: Aerobic   Nustep  seat 7 L1 x 7', PT present to discuss progress      Knee/Hip Exercises:  Seated   Long Arc Quad  Strengthening;Both;3 sets;10 reps    Long Arc Quad Weight  1 lbs.    Marching  Strengthening;Both;20 reps      Knee/Hip Exercises: Supine   Short Arc Quad Sets  Strengthening;Right;15 reps;2 sets    [employer] Sets Limitations  propped on wedge    Heel Slides  AROM;Right;20 reps    Heel Slides Limitations  propped on wedge      Ankle Exercises: Seated   Toe Raise  20 reps    Toe Raise Limitations  Rt foot on rocker board, heel/toe raises               PT Short Term Goals - 01/08/19 1731      PT SHORT TERM GOAL #1   Title  Pt will be ind in intial HEP    Status  Achieved      PT SHORT TERM GOAL #2   Title  Pt will ambulate at least 60 feet with step to or step through gait pattern with rollator without shuffling Rt LE at least 50% of the time.    Baseline  160 feet, at least 75% without shuffling    Status  Achieved      PT SHORT TERM GOAL #3   Title  Pt will perform sit to stand with UEs as needed using proper mechanics, symmetry and no LOB without use of momentum x 5 reps.    Status  On-going        PT Long Term Goals - 01/12/19 1727      PT LONG TERM GOAL #1   Title  Pt will be safe and independent with advanced HEP.    Baseline  Pt too fatigued with daily tasks to do every day    Status  On-going      PT LONG TERM GOAL #2   Title  Pt will achieve community distance ambulation with more erect posture with proper advancement of Rt LE for step to or step through pattern (no shuffling) at least 75% of the time, using rollator or single point cane.    Baseline  with rollator, 160 feet    Status  On-going      PT LONG TERM GOAL #3   Title  Pt will improve TUG time to </= 40 sec to demo improved functional mobility and less fall risk.    Baseline  no improvement yet, 54 sec today 01/12/19    Status  On-going  PT LONG TERM GOAL #4   Title  Pt will improve 5x sit to stand to </= 25 sec to demo improved functional mobility and less  fall risk.    Status  Achieved      PT LONG TERM GOAL #5   Title  Pt will improve LE strength to at least 4+/5 throughout bil LEs to improve safety and functional mobility for daily tasks such as getting in/out of tub.    Status  On-going            Plan - 01/12/19 1719    Clinical Impression Statement  PT continues to focus on gait training and strength of Rt LE.  Pt admits he is not as compliant with HEP as he fatigues with daily tasks alone some days (ex - cooking). PT focused on addition of Rt ankle DF strength and cueing for improved gait pattern utilizing heel-toe pattern.  With this cueing, Pt displayed ability to take bigger steps and successfully perform heel-toe pattern of Rt LE and improved leg clearance during swing phase overall.  Pt displays step-through pattern for approx 80 feet when he fatigues and gait pattern returns to increased shuffling and step-to pattern.  Pt will continue to benefit from skilled PT along current POC.    Comorbidities  fall risk, progressive scoliosis    Examination-Activity Limitations  Bathing;Locomotion Level;Transfers;Bed Mobility;Squat;Hygiene/Grooming;Toileting;Stand    PT Frequency  2x / week    PT Duration  8 weeks    PT Treatment/Interventions  ADLs/Self Care Home Management;Electrical Stimulation;Cryotherapy;Moist Heat;DME Instruction;Gait training;Stair training;Functional mobility training;Therapeutic activities;Therapeutic exercise;Balance training;Neuromuscular re-education;Patient/family education;Manual techniques;Passive range of motion;Spinal Manipulations;Joint Manipulations;Energy conservation;Dry needling    PT Next Visit Plan 10th visit PN next time, continue heel-toe pattern cueing for gait, focus on sit to stand strength, add seated ankle DF to HEP, continue gait endurance and training    PT Home Exercise Plan  Access Code: [email or web address]       Patient will benefit from skilled therapeutic intervention in order to improve the  following deficits and impairments:  Abnormal gait, Decreased coordination, Difficulty walking, Decreased range of motion, Decreased endurance, Decreased activity tolerance, Decreased balance, Hypomobility, Impaired flexibility, Improper body mechanics, Decreased mobility, Decreased strength, Postural dysfunction  Visit Diagnosis: Abnormal posture  Other abnormalities of gait and mobility  Muscle weakness (generalized)     Problem List Patient Active Problem List   Diagnosis Date Noted  . Unstable angina (H[facility]) 08/10/2017  . Candida infection 08/30/2015  . Malignant neoplasm of prostate ([facility]) 04/28/2015  . BPH (benign prostatic hypertrophy) with urinary obstruction 10/09/2012    Loistine Simas E Zelpha Messing 01/12/2019, 5:30 PM  [facility] [address], [address] [place], [place], [address] Phone: [phone]   Fax:  [phone]  Name: Orlo SYEED RATHGEBER MRN: [id number] Date of Birth: 06-Oct-1929

## 2019-01-13 NOTE — Telephone Encounter (Signed)
Called patient's daughter at [phone] and left message asking her to cal back for the results of her father's recent MRI.

## 2019-01-14 ENCOUNTER — Encounter: Payer: Self-pay | Admitting: Physical Therapy

## 2019-01-14 ENCOUNTER — Other Ambulatory Visit: Payer: Self-pay

## 2019-01-14 ENCOUNTER — Ambulatory Visit: Payer: Medicare HMO | Admitting: Physical Therapy

## 2019-01-14 DIAGNOSIS — M6281 Muscle weakness (generalized): Secondary | ICD-10-CM

## 2019-01-14 DIAGNOSIS — R293 Abnormal posture: Secondary | ICD-10-CM

## 2019-01-14 DIAGNOSIS — R2689 Other abnormalities of gait and mobility: Secondary | ICD-10-CM

## 2019-01-14 NOTE — Therapy (Signed)
[facility] [address] [place], [place], [address] Phone: [phone]   Fax:  [phone]  Physical Therapy Treatment   Progress Note Reporting Period 12/08/18 to 01/14/19  See note below for Objective Data and Assessment of Progress/Goals.       Patient Details  Name: Seth Mccall MRN: [id number] Date of Birth: 01-11-1930 Referring Provider (PT): Magnus Sinning, MD   Encounter Date: 01/14/2019  PT End of Session - 01/14/19 1631    Visit Number  10    Date for PT Re-Evaluation  02/02/19    Authorization Type  [employer]na Medicare    PT Start Time  1625    PT Stop Time  [id number]    PT Time Calculation (min)  50 min    Equipment Utilized During Treatment  Other (comment)   rollator   Activity Tolerance  Patient tolerated treatment well    Behavior During Therapy  [facility] for tasks assessed/performed       Past Medical History:  Diagnosis Date  . Arthritis   . Complication of anesthesia    limited movement in neck because of fusion  . Gout   . Hypertension   . Prostate cancer [facility])     Past Surgical History:  Procedure Laterality Date  . BACK SURGERY  2005   fusion of C3, C4, and C5  . CATARACT EXTRACTION Bilateral   . CORONARY ATHERECTOMY N/A 08/12/2017   Procedure: CORONARY ATHERECTOMY;  Surgeon: Adrian Prows, MD;  Location: [place] CV LAB;  Service: Cardiovascular;  Laterality: N/A;  . CORONARY STENT INTERVENTION N/A 08/12/2017   Procedure: CORONARY STENT INTERVENTION;  Surgeon: Adrian Prows, MD;  Location: [place] CV LAB;  Service: Cardiovascular;  Laterality: N/A;  . GREEN LIGHT LASER TURP (TRANSURETHRAL RESECTION OF PROSTATE N/A 10/10/2012   Procedure: GREEN LIGHT LASER TURP (TRANSURETHRAL RESECTION OF PROSTATE;  Surgeon: Claybon Jabs, MD;  Location: [facility];  Service: Urology;  Laterality: N/A;  . LEFT HEART CATH AND CORONARY ANGIOGRAPHY N/A 08/12/2017   Procedure: LEFT HEART CATH AND  CORONARY ANGIOGRAPHY;  Surgeon: Adrian Prows, MD;  Location: [place] CV LAB;  Service: Cardiovascular;  Laterality: N/A;  . PROSTATE BIOPSY    . TRANSURETHRAL RESECTION OF PROSTATE  10-10-2012    There were no vitals filed for this visit.  Subjective Assessment - 01/14/19 1623    Subjective  I use my Rt arm so much on the walker to help me walk with the Rt leg given the weakness in the Rt leg and now my Rt arm hurts so much.  My Rt arm had already been hurting but this makes it worse.    Pertinent History  fall risk, worsening scoliosis, prostate cancer, heart surgeries, cervical fusion x 3 levels    Limitations  Standing;Walking    How long can you sit comfortably?  unlimited    How long can you stand comfortably?  30 min    Diagnostic tests  MRI: signif degen arthritis L4/5 but no neural compression, xrays 11/2018, progression of scoliosis as compared to 2016, lumbar multilevel DDD, facet arthoropathy    Patient Stated Goals  get stronger, walk greater distances, get in/out of tub without substitutions for weak leg, correct trunk lean to Rt    Currently in Pain?  No/denies   not in back or legs, just Rt UE        [facility] PT Assessment - 01/14/19 0001      Assessment  Medical Diagnosis  M41.9 (ICD-10-CM) - Scoliosis, unspecified scoliosis type, unspecified spinal region, M21.70 (ICD-10-CM) - Leg length discrepancy, R53.1 (ICD-10-CM) - Weakness, M48.061 (ICD-10-CM) - Spinal stenosis of lumbar region without neurogenic claudication    Referring Provider (PT)  Magnus Sinning, MD    Onset Date/Surgical Date  --   weakness started after covid-19, dec activity   Hand Dominance  Right    Next MD Visit  no    Prior Therapy  home PT      ROM / Strength   AROM / PROM / Strength  AROM      AROM   Overall AROM Comments  signif limitations in Rt hip extension (to 0 deg/neutral), abduction (30 deg)      Strength   Overall Strength Comments  glutes bil 3/5, hip abd 3+/5 bil, Rt hip flexor  see notes: able to activate Rt hip flexors in sidelying, 3/5 in sitting, strength does not transfer to gait in weightbearing      Flexibility   Soft Tissue Assessment /Muscle Length  yes   hip flexors limited 70% on Rt   Hamstrings  limited 30% bil    Quadriceps  limited 60% on Rt      Palpation   SI assessment   poor mobility Rt SI joint      Ambulation/Gait   Assistive device  Rollator    Gait Pattern  Step-to pattern;Step-through pattern;Decreased step length - right    Gait Comments  lack of Rt hip flexion, shuffles/drags Rt LE, can perform step with heel strike when cued heavily but does fatigue after approx 80 feet                   [facility] Adult PT Treatment/Exercise - 01/14/19 0001      Posture/Postural Control   Postural Limitations  Rounded Shoulders;Increased thoracic kyphosis;Flexed trunk;Weight shift left;Forward head;Decreased lumbar lordosis    Posture Comments  signif scoliosis present       Exercises   Exercises  Lumbar;Knee/Hip      Lumbar Exercises: Aerobic   Nustep  seat 7, x 8', PT present to discuss progress/HEP      Lumbar Exercises: Sidelying   Clam  Right;20 reps   2x10   Hip Abduction  Right   AA/ROM with knee bent to shorten lever     Knee/Hip Exercises: Sidelying   Other Sidelying Knee/Hip Exercises  Rt hip flexion marching in Lt SL 2x10    Other Sidelying Knee/Hip Exercises  manual resistance isometrics Rt hip flexion 5x5 sec, Rt hip in Lt SL      Manual Therapy   Manual Therapy  Passive ROM;Soft tissue mobilization    Soft tissue mobilization  Rt quads, hip flexors, ITB, adductor magnus, lateral hamstring, instrument assisted with [place] and manual by PT    Passive ROM  Rt hip circumduction, extension, abduction, repeated ROM and sustained hold for soft tissue stretch of adductors, hip flexors, quads on Rt   in Lt SL              PT Short Term Goals - 01/08/19 1731      PT SHORT TERM GOAL #1   Title  Pt will be ind in  intial HEP    Status  Achieved      PT SHORT TERM GOAL #2   Title  Pt will ambulate at least 60 feet with step to or step through gait pattern with rollator without shuffling Rt LE at least 50%  of the time.    Baseline  160 feet, at least 75% without shuffling    Status  Achieved      PT SHORT TERM GOAL #3   Title  Pt will perform sit to stand with UEs as needed using proper mechanics, symmetry and no LOB without use of momentum x 5 reps.    Status  On-going        PT Long Term Goals - 01/12/19 1727      PT LONG TERM GOAL #1   Title  Pt will be safe and independent with advanced HEP.    Baseline  Pt too fatigued with daily tasks to do every day    Status  On-going      PT LONG TERM GOAL #2   Title  Pt will achieve community distance ambulation with more erect posture with proper advancement of Rt LE for step to or step through pattern (no shuffling) at least 75% of the time, using rollator or single point cane.    Baseline  with rollator, 160 feet    Status  On-going      PT LONG TERM GOAL #3   Title  Pt will improve TUG time to </= 40 sec to demo improved functional mobility and less fall risk.    Baseline  no improvement yet, 54 sec today 01/12/19    Status  On-going      PT LONG TERM GOAL #4   Title  Pt will improve 5x sit to stand to </= 25 sec to demo improved functional mobility and less fall risk.    Status  Achieved      PT LONG TERM GOAL #5   Title  Pt will improve LE strength to at least 4+/5 throughout bil LEs to improve safety and functional mobility for daily tasks such as getting in/out of tub.    Status  On-going            Plan - 01/14/19 1734    Clinical Impression Statement  MRI of lumbar spine did not explain Rt LE weakness.  PT focused today on Rt hip ROM, stretching, and gravity eliminated manual resistive exercises with Pt in Lt SL.  Significant limitations are present in Rt hip ROM especially in extension and abduction with flexibility limits of  quads, hip, flexors and adductors.  PT is able to ellicit good hip flexor contraction in sidelying but this does not seem to carry over into seated march and gait advancement of Rt LE.  Pt compensates with overuse of Rt UE on rollator to advance Rt LE which is causing him increased Rt UE pain.  PT used manual facilitation of pelvis with gait which Pt reported improved ease of Rt LE swing phase.  PT will continue to focus on ROM/stretching/manual techniques to improve overall joint mechanics and control to see if this provides meaningful translation into functional strength for Pt.    Personal Factors and Comorbidities  Age;Comorbidity 1;Comorbidity 2    Comorbidities  fall risk, progressive scoliosis    Examination-Activity Limitations  Bathing;Locomotion Level;Transfers;Bed Mobility;Squat;Hygiene/Grooming;Toileting;Stand    Examination-Participation Restrictions  Meal Prep;Cleaning;Driving;Laundry    Rehab Potential  Good    PT Frequency  2x / week    PT Duration  8 weeks    PT Treatment/Interventions  ADLs/Self Care Home Management;Electrical Stimulation;Cryotherapy;Moist Heat;DME Instruction;Gait training;Stair training;Functional mobility training;Therapeutic activities;Therapeutic exercise;Balance training;Neuromuscular re-education;Patient/family education;Manual techniques;Passive range of motion;Spinal Manipulations;Joint Manipulations;Energy conservation;Dry needling    PT Next Visit Plan  continue  Rt hip ROM, Rt LE stretching in Lt SL, pelvic PNF, MREs Rt hip, update SL HEP    PT Home Exercise Plan  Access Code: [email or web address]    Consulted and Agree with Plan of Care  Patient;Family member/caregiver    Family Member Consulted  adult daughter       Patient will benefit from skilled therapeutic intervention in order to improve the following deficits and impairments:  Abnormal gait, Decreased coordination, Difficulty walking, Decreased range of motion, Decreased endurance, Decreased activity  tolerance, Decreased balance, Hypomobility, Impaired flexibility, Improper body mechanics, Decreased mobility, Decreased strength, Postural dysfunction  Visit Diagnosis: Abnormal posture  Other abnormalities of gait and mobility  Muscle weakness (generalized)     Problem List Patient Active Problem List   Diagnosis Date Noted  . Unstable angina ([place]) 08/10/2017  . Candida infection 08/30/2015  . Malignant neoplasm of prostate ([place]) 04/28/2015  . BPH (benign prostatic hypertrophy) with urinary obstruction 10/09/2012    Baruch Merl, PT 01/14/19 5:47 PM   [facility] [address], [place], [place], [address] Phone: [phone]   Fax:  [phone]  Name: Ashad GASTON HARDEN MRN: [id number] Date of Birth: [birth year]

## 2019-01-19 ENCOUNTER — Encounter: Payer: Self-pay | Admitting: Physical Therapy

## 2019-01-19 ENCOUNTER — Other Ambulatory Visit: Payer: Self-pay

## 2019-01-19 ENCOUNTER — Ambulatory Visit: Payer: Medicare HMO | Admitting: Physical Therapy

## 2019-01-19 DIAGNOSIS — R2689 Other abnormalities of gait and mobility: Secondary | ICD-10-CM

## 2019-01-19 DIAGNOSIS — M6281 Muscle weakness (generalized): Secondary | ICD-10-CM

## 2019-01-19 DIAGNOSIS — R293 Abnormal posture: Secondary | ICD-10-CM

## 2019-01-19 NOTE — Therapy (Signed)
[facility] [address], [place], [place], [address] Phone: [phone]   Fax:  [phone]  Physical Therapy Treatment  Patient Details  Name: Seth Mccall MRN: [id number] Date of Birth: 1930/05/14 Referring Provider (PT): Magnus Sinning, MD   Encounter Date: 01/19/2019  PT End of Session - 01/19/19 1548    Visit Number  11    Date for PT Re-Evaluation  02/02/19    Authorization Type  [employer]    PT Start Time  1540    PT Stop Time  1625    PT Time Calculation (min)  45 min    Activity Tolerance  Patient tolerated treatment well    Behavior During Therapy  [facility] for tasks assessed/performed       Past Medical History:  Diagnosis Date  . Arthritis   . Complication of anesthesia    limited movement in neck because of fusion  . Gout   . Hypertension   . Prostate cancer [facility])     Past Surgical History:  Procedure Laterality Date  . BACK SURGERY  2005   fusion of C3, C4, and C5  . CATARACT EXTRACTION Bilateral   . CORONARY ATHERECTOMY N/A 08/12/2017   Procedure: CORONARY ATHERECTOMY;  Surgeon: Adrian Prows, MD;  Location: [place] [facility];  Service: Cardiovascular;  Laterality: N/A;  . CORONARY STENT INTERVENTION N/A 08/12/2017   Procedure: CORONARY STENT INTERVENTION;  Surgeon: Adrian Prows, MD;  Location: [place] [facility];  Service: Cardiovascular;  Laterality: N/A;  . GREEN LIGHT LASER TURP (TRANSURETHRAL RESECTION OF PROSTATE N/A 10/10/2012   Procedure: GREEN LIGHT LASER TURP (TRANSURETHRAL RESECTION OF PROSTATE;  Surgeon: Claybon Jabs, MD;  Location: [facility];  Service: Urology;  Laterality: N/A;  . LEFT HEART CATH AND CORONARY ANGIOGRAPHY N/A 08/12/2017   Procedure: LEFT HEART CATH AND CORONARY ANGIOGRAPHY;  Surgeon: Adrian Prows, MD;  Location: [place] [facility];  Service: Cardiovascular;  Laterality: N/A;  . PROSTATE BIOPSY    . TRANSURETHRAL RESECTION OF PROSTATE  10-10-2012    There  were no vitals filed for this visit.  Subjective Assessment - 01/19/19 1547    Subjective  My Rt arm isn't hurting as much today.  Rt leg is feeling about the same with walking - very difficult to move it to walk.    Pertinent History  fall risk, worsening scoliosis, prostate cancer, heart surgeries, cervical fusion x 3 levels    Limitations  Standing;Walking    How long can you sit comfortably?  unlimited    How long can you stand comfortably?  30 min    How long can you walk comfortably?  100 feet    Diagnostic tests  MRI: signif degen arthritis L4/5 but no neural compression, xrays 11/2018, progression of scoliosis as compared to 2016, lumbar multilevel DDD, facet arthoropathy    Patient Stated Goals  get stronger, walk greater distances, get in/out of tub without substitutions for weak leg, correct trunk lean to Rt    Currently in Pain?  No/denies                       [facility] Adult PT Treatment/Exercise - 01/19/19 0001      Ambulation/Gait   Ambulation Distance (Feet)  100 Feet    Assistive device  Rollator    Gait Pattern  Step-to pattern;Step-through pattern    Pre-Gait Activities  Rt pelvic PNF for anterior elevation/posterior depression to simulate gait pattern  at pelvis, passive hip ROM on Rt in Lt SL    Gait Comments  PT provided MC and VC for pelvic anterior elevation on Rt to help facilitate swing phase on Rt   PT cued less use of UEs on rollator     Lumbar Exercises: Aerobic   Nustep  seat 7, 8', PT present to discuss symptoms and progress      Lumbar Exercises: Sidelying   Clam Limitations  2x10 with manual resistance (min by PT)   Right only     Knee/Hip Exercises: Seated   Sit to Sand  3 sets;5 reps   PT cued Pt to lean forward to transfer weight into LEs     Knee/Hip Exercises: Sidelying   Hip ABduction  AAROM;Strengthening;Right;5 reps;2 sets    Other Sidelying Knee/Hip Exercises  Rt hip flexion in Lt SL bent knee 3x5 reps, A/ROM, AA/ROM as needed  for fatigue, unable to perform with knee straight due to difficulty with longer lever      Manual Therapy   Manual Therapy  Passive ROM    Manual therapy comments  Manual resistance exercises on Rt: pelvic PNF in Lt SL for anterior elevation/posterior depression, contract/relax Rt hip flexor    Soft tissue mobilization  --    Passive ROM  Rt hip circumduction, extension, abduction, repeated ROM and sustained hold for soft tissue stretch of adductors, hip flexors, quads on Rt   in Lt SL              PT Short Term Goals - 01/08/19 1731      PT SHORT TERM GOAL #1   Title  Pt will be ind in intial HEP    Status  Achieved      PT SHORT TERM GOAL #2   Title  Pt will ambulate at least 60 feet with step to or step through gait pattern with rollator without shuffling Rt LE at least 50% of the time.    Baseline  160 feet, at least 75% without shuffling    Status  Achieved      PT SHORT TERM GOAL #3   Title  Pt will perform sit to stand with UEs as needed using proper mechanics, symmetry and no LOB without use of momentum x 5 reps.    Status  On-going        PT Long Term Goals - 01/19/19 1642      PT LONG TERM GOAL #1   Title  Pt will be safe and independent with advanced HEP.    Status  On-going      PT LONG TERM GOAL #2   Title  Pt will achieve community distance ambulation with more erect posture with proper advancement of Rt LE for step to or step through pattern (no shuffling) at least 75% of the time, using rollator or single point cane.    Status  On-going      PT LONG TERM GOAL #3   Title  Pt will improve TUG time to </= 40 sec to demo improved functional mobility and less fall risk.    Status  On-going      PT LONG TERM GOAL #4   Title  Pt will improve 5x sit to stand to </= 25 sec to demo improved functional mobility and less fall risk.    Status  Achieved      PT LONG TERM GOAL #5   Title  Pt will improve LE strength to at least 4+/5  throughout bil LEs to  improve safety and functional mobility for daily tasks such as getting in/out of tub.    Status  On-going            Plan - 01/19/19 1638    Clinical Impression Statement  Pt continues to have difficulty initiating Rt LE swing phase and advancement of Rt LE with gait.  He is also unable to perform excursion of Rt LE march in sitting.  In gravity eliminated positions he is able to activate hip flexors and perform sidelying march.  He is unable to perform sidelying SLR with Rt LE due to weakness.  PT focused on improving very stiff pelvic girdle and hip on Rt with manual therapy and PNF patterns to help with biomechanics and optimal alignement of Rt hemipelvis for length-tension relationship of Rt hip musculature.  Pt was able to take about 15-20 steps with imrpoved pelvic activation on Rt before returning to compensatory patterns end of session.  PT to continue along POC with careful monitoring of progress.  Anticipate re-evalation in 2 weeks.    Comorbidities  fall risk, progressive scoliosis    Rehab Potential  Good    PT Frequency  2x / week    PT Treatment/Interventions  ADLs/Self Care Home Management;Electrical Stimulation;Cryotherapy;Moist Heat;DME Instruction;Gait training;Stair training;Functional mobility training;Therapeutic activities;Therapeutic exercise;Balance training;Neuromuscular re-education;Patient/family education;Manual techniques;Passive range of motion;Spinal Manipulations;Joint Manipulations;Energy conservation;Dry needling    PT Next Visit Plan  continue Rt hip ROM, Rt LE stretching in Lt SL, pelvic PNF, MREs Rt hip, update SL HEP    PT Home Exercise Plan  Access Code: [email or web address]    Consulted and Agree with Plan of Care  Patient;Family member/caregiver    Family Member Consulted  adult daughter       Patient will benefit from skilled therapeutic intervention in order to improve the following deficits and impairments:  Abnormal gait, Decreased coordination, Difficulty  walking, Decreased range of motion, Decreased endurance, Decreased activity tolerance, Decreased balance, Hypomobility, Impaired flexibility, Improper body mechanics, Decreased mobility, Decreased strength, Postural dysfunction  Visit Diagnosis: Abnormal posture  Other abnormalities of gait and mobility  Muscle weakness (generalized)     Problem List Patient Active Problem List   Diagnosis Date Noted  . Unstable angina ([place]) 08/10/2017  . Candida infection 08/30/2015  . Malignant neoplasm of prostate ([place]) 04/28/2015  . BPH (benign prostatic hypertrophy) with urinary obstruction 10/09/2012    Baruch Merl, PT 01/19/19 4:44 PM   [facility] [address] [place], [place], [address] Phone: [phone]   Fax:  [phone]  Name: Seth Mccall MRN: [id number] Date of Birth: [birth year]

## 2019-01-21 ENCOUNTER — Other Ambulatory Visit: Payer: Self-pay

## 2019-01-21 ENCOUNTER — Ambulatory Visit: Payer: Medicare HMO | Attending: Physical Medicine and Rehabilitation | Admitting: Physical Therapy

## 2019-01-21 ENCOUNTER — Encounter: Payer: Self-pay | Admitting: Physical Therapy

## 2019-01-21 DIAGNOSIS — R293 Abnormal posture: Secondary | ICD-10-CM | POA: Insufficient documentation

## 2019-01-21 DIAGNOSIS — R2689 Other abnormalities of gait and mobility: Secondary | ICD-10-CM | POA: Diagnosis present

## 2019-01-21 DIAGNOSIS — M6281 Muscle weakness (generalized): Secondary | ICD-10-CM | POA: Diagnosis not present

## 2019-01-21 NOTE — Therapy (Signed)
[facility] [address] [place], [place], [address] Phone: [phone]   Fax:  [phone]  Physical Therapy Treatment  Patient Details  Name: Seth Mccall MRN: [id number] Date of Birth: 1929-12-28 Referring Provider (PT): Magnus Sinning, MD   Encounter Date: 01/21/2019  PT End of Session - 01/21/19 1553    Visit Number  12    Date for PT Re-Evaluation  02/02/19    Authorization Type  [employer]na Medicare    PT Start Time  1550    PT Stop Time  1630    PT Time Calculation (min)  40 min    Equipment Utilized During Treatment  Other (comment)   rollator   Activity Tolerance  Patient limited by fatigue;Patient tolerated treatment well;Other (comment)   limited by Rt LE weakness   Behavior During Therapy  [facility] for tasks assessed/performed       Past Medical History:  Diagnosis Date  . Arthritis   . Complication of anesthesia    limited movement in neck because of fusion  . Gout   . Hypertension   . Prostate cancer [facility])     Past Surgical History:  Procedure Laterality Date  . BACK SURGERY  2005   fusion of C3, C4, and C5  . CATARACT EXTRACTION Bilateral   . CORONARY ATHERECTOMY N/A 08/12/2017   Procedure: CORONARY ATHERECTOMY;  Surgeon: Adrian Prows, MD;  Location: [place] [facility];  Service: Cardiovascular;  Laterality: N/A;  . CORONARY STENT INTERVENTION N/A 08/12/2017   Procedure: CORONARY STENT INTERVENTION;  Surgeon: Adrian Prows, MD;  Location: [place] [facility];  Service: Cardiovascular;  Laterality: N/A;  . GREEN LIGHT LASER TURP (TRANSURETHRAL RESECTION OF PROSTATE N/A 10/10/2012   Procedure: GREEN LIGHT LASER TURP (TRANSURETHRAL RESECTION OF PROSTATE;  Surgeon: Claybon Jabs, MD;  [facility];  Service: Urology;  Laterality: N/A;  . LEFT HEART CATH AND CORONARY ANGIOGRAPHY N/A 08/12/2017   Procedure: LEFT HEART CATH AND CORONARY ANGIOGRAPHY;  Surgeon: Adrian Prows, MD;  Location: [place] [facility];  Service: Cardiovascular;  Laterality: N/A;  . PROSTATE BIOPSY    . TRANSURETHRAL RESECTION OF PROSTATE  10-10-2012    There were no vitals filed for this visit.  Subjective Assessment - 01/21/19 1553    Subjective  I still have to use my arms a lot of get my Rt leg to walk.  "I walk with my arms."    Pertinent History  fall risk, worsening scoliosis, prostate cancer, heart surgeries, cervical fusion x 3 levels    How long can you sit comfortably?  unlimited    How long can you stand comfortably?  30 min    How long can you walk comfortably?  100 feet    Diagnostic tests  MRI: signif degen arthritis L4/5 but no neural compression, xrays 11/2018, progression of scoliosis as compared to 2016, lumbar multilevel DDD, facet arthoropathy    Patient Stated Goals  get stronger, walk greater distances, get in/out of tub without substitutions for weak leg, correct trunk lean to Rt    Currently in Pain?  No/denies                       [facility] Adult PT Treatment/Exercise - 01/21/19 0001      Ambulation/Gait   Ambulation Distance (Feet)  100 Feet    Assistive device  Rollator    Gait Pattern  Step-to pattern;Step-through pattern    Gait Comments  Pt continues to report reliance of Rt UE on rollator to advance Rt LE   PT cued less use of UEs on rollator     Exercises   Exercises  Lumbar;Knee/Hip      Lumbar Exercises: Stretches   Lower Trunk Rotation  5 reps;10 seconds      Lumbar Exercises: Aerobic   Nustep  seat 6 x 8', PT present to discuss progress and HEP      Knee/Hip Exercises: Seated   Long Arc Quad  Strengthening;Both;10 reps;1 set    [employer] Limitations  very difficult on Rt      Knee/Hip Exercises: Supine   Quad Sets  Strengthening;Both;10 reps;1 set    Heel Slides  Strengthening;10 reps;2 sets;Both    [employer];Both   PT cued eccentric control   Other Supine Knee/Hip Exercises  hooklying marching x 20 alt bil, for Rt hip strength       Knee/Hip Exercises: Sidelying   Hip ABduction  AAROM;Strengthening;Right;5 reps;2 sets    Clams  3x10 Rt only in Lt SL    Other Sidelying Knee/Hip Exercises  Rt hip flexion in Lt SL bent knee 2x10 reps, A/ROM               PT Short Term Goals - 01/08/19 1731      PT SHORT TERM GOAL #1   Title  Pt will be ind in intial HEP    Status  Achieved      PT SHORT TERM GOAL #2   Title  Pt will ambulate at least 60 feet with step to or step through gait pattern with rollator without shuffling Rt LE at least 50% of the time.    Baseline  160 feet, at least 75% without shuffling    Status  Achieved      PT SHORT TERM GOAL #3   Title  Pt will perform sit to stand with UEs as needed using proper mechanics, symmetry and no LOB without use of momentum x 5 reps.    Status  On-going        PT Long Term Goals - 01/19/19 1642      PT LONG TERM GOAL #1   Title  Pt will be safe and independent with advanced HEP.    Status  On-going      PT LONG TERM GOAL #2   Title  Pt will achieve community distance ambulation with more erect posture with proper advancement of Rt LE for step to or step through pattern (no shuffling) at least 75% of the time, using rollator or single point cane.    Status  On-going      PT LONG TERM GOAL #3   Title  Pt will improve TUG time to </= 40 sec to demo improved functional mobility and less fall risk.    Status  On-going      PT LONG TERM GOAL #4   Title  Pt will improve 5x sit to stand to </= 25 sec to demo improved functional mobility and less fall risk.    Status  Achieved      PT LONG TERM GOAL #5   Title  Pt will improve LE strength to at least 4+/5 throughout bil LEs to improve safety and functional mobility for daily tasks such as getting in/out of tub.    Status  On-going            Plan - 01/21/19 1736    Clinical  Impression Statement  PT updated HEP to have patient work on Rt hip strength in sidelying to eliminate gravity.  He is  unable to perform Rt hip flexion in sitting and standing without max use of UEs on his rollator to advance or lift his Rt LE.  This negatively impacts his gait, creating a short Rt shuffle step length with max compensation which has not shown much progress with PT.  Rt quads are 3/5.  Sit to stand is improving with less need to rock multiple times to stand up although this is inconsistent.  PT to re-evaluate next visit.    Rehab Potential  Good    PT Frequency  2x / week    PT Duration  8 weeks    PT Treatment/Interventions  ADLs/Self Care Home Management;Electrical Stimulation;Cryotherapy;Moist Heat;DME Instruction;Gait training;Stair training;Functional mobility training;Therapeutic activities;Therapeutic exercise;Balance training;Neuromuscular re-education;Patient/family education;Manual techniques;Passive range of motion;Spinal Manipulations;Joint Manipulations;Energy conservation;Dry needling    PT Next Visit Plan  f/u on supine and sidelying HEP - is Pt able to do it on his soft bed?  RE-EVAL next visit    PT Home Exercise Plan  Access Code: [email or web address]    Consulted and Agree with Plan of Care  Patient       Patient will benefit from skilled therapeutic intervention in order to improve the following deficits and impairments:  Abnormal gait, Decreased coordination, Difficulty walking, Decreased range of motion, Decreased endurance, Decreased activity tolerance, Decreased balance, Hypomobility, Impaired flexibility, Improper body mechanics, Decreased mobility, Decreased strength, Postural dysfunction  Visit Diagnosis: Muscle weakness (generalized)  Abnormal posture  Other abnormalities of gait and mobility     Problem List Patient Active Problem List   Diagnosis Date Noted  . Unstable angina ([place]) 08/10/2017  . Candida infection 08/30/2015  . Malignant neoplasm of prostate ([place]) 04/28/2015  . BPH (benign prostatic hypertrophy) with urinary obstruction 10/09/2012    Baruch Merl,  PT 01/21/19 5:47 PM   [facility] [address], [place], [place], [address] Phone: [phone]   Fax:  [phone]  Name: Om ALPHEUS MERRIMAN MRN: [id number] Date of Birth: 03/06/1930

## 2019-01-28 ENCOUNTER — Telehealth: Payer: Self-pay | Admitting: Physical Medicine and Rehabilitation

## 2019-01-28 ENCOUNTER — Ambulatory Visit: Payer: Medicare HMO | Admitting: Physical Therapy

## 2019-01-28 ENCOUNTER — Other Ambulatory Visit: Payer: Self-pay

## 2019-01-28 ENCOUNTER — Encounter: Payer: Self-pay | Admitting: Physical Therapy

## 2019-01-28 DIAGNOSIS — M6281 Muscle weakness (generalized): Secondary | ICD-10-CM | POA: Diagnosis not present

## 2019-01-28 DIAGNOSIS — R293 Abnormal posture: Secondary | ICD-10-CM

## 2019-01-28 DIAGNOSIS — M961 Postlaminectomy syndrome, not elsewhere classified: Secondary | ICD-10-CM

## 2019-01-28 DIAGNOSIS — R2689 Other abnormalities of gait and mobility: Secondary | ICD-10-CM

## 2019-01-28 NOTE — Therapy (Signed)
[facility] [address], [place], [place], [address] Phone: [phone]   Fax:  [phone]  Physical Therapy Treatment  Patient Details  Name: Seth Mccall MRN: [id number] Date of Birth: April 21, 1930 Referring Provider (PT): Magnus Sinning, MD   Encounter Date: 01/28/2019  PT End of Session - 01/28/19 1214    Visit Number  13    Date for PT Re-Evaluation  03/25/19    Authorization Type  [employer] Medicare    PT Start Time  1030    PT Stop Time  1114    PT Time Calculation (min)  44 min    Equipment Utilized During Treatment  --   rollator, manual w/c   Activity Tolerance  Patient tolerated treatment well    Behavior During Therapy  [facility] for tasks assessed/performed       Past Medical History:  Diagnosis Date  . Arthritis   . Complication of anesthesia    limited movement in neck because of fusion  . Gout   . Hypertension   . Prostate cancer [facility])     Past Surgical History:  Procedure Laterality Date  . BACK SURGERY  2005   fusion of C3, C4, and C5  . CATARACT EXTRACTION Bilateral   . CORONARY ATHERECTOMY N/A 08/12/2017   Procedure: CORONARY ATHERECTOMY;  Surgeon: Adrian Prows, MD;  Location: [place] [facility];  Service: Cardiovascular;  Laterality: N/A;  . CORONARY STENT INTERVENTION N/A 08/12/2017   Procedure: CORONARY STENT INTERVENTION;  Surgeon: Adrian Prows, MD;  Location: [place] [facility];  Service: Cardiovascular;  Laterality: N/A;  . GREEN LIGHT LASER TURP (TRANSURETHRAL RESECTION OF PROSTATE N/A 10/10/2012   Procedure: GREEN LIGHT LASER TURP (TRANSURETHRAL RESECTION OF PROSTATE;  Surgeon: Claybon Jabs, MD;  Location: [facility];  Service: Urology;  Laterality: N/A;  . LEFT HEART CATH AND CORONARY ANGIOGRAPHY N/A 08/12/2017   Procedure: LEFT HEART CATH AND CORONARY ANGIOGRAPHY;  Surgeon: Adrian Prows, MD;  Location: [place] [facility];  Service: Cardiovascular;  Laterality: N/A;  . PROSTATE BIOPSY     . TRANSURETHRAL RESECTION OF PROSTATE  10-10-2012    There were no vitals filed for this visit.  Subjective Assessment - 01/28/19 1032    Subjective  My main problem has always been that I can't walk with my Rt leg being so weak.  I have to put all my weight in my Rt arm and it hasn't gotten better.  Tub transfers are much better with use of the double pad.    Pertinent History  fall risk, worsening scoliosis, prostate cancer, heart surgeries, cervical fusion x 3 levels    Limitations  Standing;Walking    How long can you sit comfortably?  unlimited    How long can you stand comfortably?  30 min    How long can you walk comfortably?  100 feet    Diagnostic tests  MRI: signif degen arthritis L4/5 but no neural compression, xrays 11/2018, progression of scoliosis as compared to 2016, lumbar multilevel DDD, facet arthoropathy    Patient Stated Goals  get stronger, walk greater distances, get in/out of tub without substitutions for weak leg, correct trunk lean to Rt    Currently in Pain?  No/denies         [facility] PT Assessment - 01/28/19 0001      Assessment   Medical Diagnosis  M41.9 (ICD-10-CM) - Scoliosis, unspecified scoliosis type, unspecified spinal region, M21.70 (ICD-10-CM) - Leg length discrepancy, R53.1 (ICD-10-CM) -  Weakness, M48.061 (ICD-10-CM) - Spinal stenosis of lumbar region without neurogenic claudication    Referring Provider (PT)  Magnus Sinning, MD    Onset Date/Surgical Date  --   weakness started after covid-19, dec activity   Hand Dominance  Right    Next MD Visit  no    Prior Therapy  home PT      AROM   Overall AROM Comments  Rt hip extension to neutral (0 deg), hip abd on Rt (15 deg)      Strength   Right Hip Flexion  2+/5    Right Hip Extension  3/5    Right Hip ABduction  3/5      Flexibility   Soft Tissue Assessment /Muscle Length  yes   hip flexors limited 70%, hip adductors limited 70%, Rt   Hamstrings  limited 50%   Rt   Quadriceps  limited 50%    Rt     Transfers   Comments  sit to stand using bil UEs on chair and PT cue to use legs more than arms, less momentum used when Pt uses legs more but he needs cueing to do so      Ambulation/Gait   Ambulation Distance (Feet)  100 Feet    Assistive device  Rollator    Gait Pattern  Step-to pattern;Step-through pattern    Gait Comments  Pt continues to report reliance of Rt UE on rollator to advance Rt LE   PT cued less use of UEs on rollator     Standardized Balance Assessment   Five times sit to stand comments   21, use of bil UEs and PT cues to use legs more than arms      Timed Up and Go Test   Normal TUG (seconds)  50    TUG Comments  with rollator, improved sit to stand but slow gait and turns                   [facility] Adult PT Treatment/Exercise - 01/28/19 0001      Posture/Postural Control   Posture/Postural Control  Postural limitations    Postural Limitations  Increased thoracic kyphosis;Decreased lumbar lordosis;Left pelvic obliquity;Weight shift right;Flexed trunk;Forward head;Rounded Shoulders      Lumbar Exercises: Stretches   Lower Trunk Rotation  5 reps;10 seconds      Knee/Hip Exercises: Sidelying   Hip ABduction  AAROM;Strengthening;Right;2 sets;1 set;10 reps;AROM    Clams  3x10, right    Other Sidelying Knee/Hip Exercises  Rt hip flexion march 3x10               PT Short Term Goals - 01/08/19 1731      PT SHORT TERM GOAL #1   Title  Pt will be ind in intial HEP    Status  Achieved      PT SHORT TERM GOAL #2   Title  Pt will ambulate at least 60 feet with step to or step through gait pattern with rollator without shuffling Rt LE at least 50% of the time.    Baseline  160 feet, at least 75% without shuffling    Status  Achieved      PT SHORT TERM GOAL #3   Title  Pt will perform sit to stand with UEs as needed using proper mechanics, symmetry and no LOB without use of momentum x 5 reps.    Status  On-going        PT Long Term  Goals -  01/19/19 1642      PT LONG TERM GOAL #1   Title  Pt will be safe and independent with advanced HEP.    Status  On-going      PT LONG TERM GOAL #2   Title  Pt will achieve community distance ambulation with more erect posture with proper advancement of Rt LE for step to or step through pattern (no shuffling) at least 75% of the time, using rollator or single point cane.    Status  On-going      PT LONG TERM GOAL #3   Title  Pt will improve TUG time to </= 40 sec to demo improved functional mobility and less fall risk.    Status  On-going      PT LONG TERM GOAL #4   Title  Pt will improve 5x sit to stand to </= 25 sec to demo improved functional mobility and less fall risk.    Status  Achieved      PT LONG TERM GOAL #5   Title  Pt will improve LE strength to at least 4+/5 throughout bil LEs to improve safety and functional mobility for daily tasks such as getting in/out of tub.    Status  On-going            Plan - 01/28/19 1214    Clinical Impression Statement  Pt has experienced significantly reduced activity and increased fall frequency since covid-19 closures.  Prior to Spring 2020, he ambulated with a straight cane in home and community.  He now spends time in a wheelchair and performs short distance ambulation using rollator with great difficulty.  He continues to display signif weakness in Rt hip strength when spine is loaded in sitting and standing, affecting gait and limiting against-gravity exercise progression.  Rt hip flexion strength is 2+/5, demo'ing ability to perform Rt hip marching with min resistance in Lt sidelying.  He is able to perform 3x10 of this exercise, which is an improvement from 1x10 before fatigue previously.  Pt has signif gait dysfunction, ambulating with rollator x 100' but overusing Rt UE on rollator to assist with advancement of Rt LE with shuffle slide-to pattern, causing him Rt UE pain.  For transfers, he displays improved sit to stand with  less momentum with heavy verbal cues by PT to push with legs vs use all arm effort.  He uses momentum with sit to sidelying due to difficulty lifting Rt LE.  5x sit to stand showed signif improvement from baseline and measured 21 sec today with UE assist.  TUG test was 50 sec, revealing ongoing fall risk.  Rt hip and pelvic flexibility and ROM is severely limited, with Rt hip extension to neutral, and abduction to 15 deg.  This stiffness likely makes Rt hip strength attempts more difficult to work against.  Flexibility is slowly progressing with passive stretching and ROM by PT for Rt hip and pelvis.  Pt and his wife are both disabled and have limited opportunity to be active both inside and outside the home.  Pt will likely deteriorate and lose further mobility and endurance without physical therapy.  PT recommends extension of PT for 2x/week for 8 weeks to continue working on gait, gait endurance, cardio endurance, sit to stand, ROM/flexibility, and LE strength as able to prevent regression and ease mobility for basic daily tasks.    Personal Factors and Comorbidities  Age;Comorbidity 1;Comorbidity 2    Comorbidities  fall risk, progressive scoliosis    Examination-Activity  Limitations  Bathing;Locomotion Level;Transfers;Bed Mobility;Squat;Hygiene/Grooming;Toileting;Stand    Examination-Participation Restrictions  Meal Prep;Cleaning;Driving;Laundry    Stability/Clinical Decision Making  Evolving/Moderate complexity    Clinical Decision Making  Moderate    Rehab Potential  Fair    PT Frequency  2x / week    PT Duration  8 weeks    PT Treatment/Interventions  ADLs/Self Care Home Management;Electrical Stimulation;Cryotherapy;Moist Heat;DME Instruction;Gait training;Stair training;Functional mobility training;Therapeutic activities;Therapeutic exercise;Balance training;Neuromuscular re-education;Patient/family education;Manual techniques;Passive range of motion;Spinal Manipulations;Joint Manipulations;Energy  conservation;Dry needling    PT Next Visit Plan  continue gait, Rt hip ROM/stretching, gravity elim hip strengthening    PT Home Exercise Plan  Access Code: [email or web address]    Recommended Other Services  is re-cert signed    Consulted and Agree with Plan of Care  Patient;Family member/caregiver   Pt's adult daughter   Family Member Consulted  adult daughter       Patient will benefit from skilled therapeutic intervention in order to improve the following deficits and impairments:  Abnormal gait, Decreased coordination, Difficulty walking, Decreased range of motion, Decreased endurance, Decreased activity tolerance, Decreased balance, Hypomobility, Impaired flexibility, Improper body mechanics, Decreased mobility, Decreased strength, Postural dysfunction  Visit Diagnosis: Muscle weakness (generalized) - Plan: PT plan of care cert/re-cert  Abnormal posture - Plan: PT plan of care cert/re-cert  Other abnormalities of gait and mobility - Plan: PT plan of care cert/re-cert     Problem List Patient Active Problem List   Diagnosis Date Noted  . Unstable angina ([place]) 08/10/2017  . Candida infection 08/30/2015  . Malignant neoplasm of prostate ([place]) 04/28/2015  . BPH (benign prostatic hypertrophy) with urinary obstruction 10/09/2012    Baruch Merl, PT 01/28/19 12:22 PM   [facility] [address] [place], [place], [address] Phone: [phone]   Fax:  [phone]  Name: Seth Mccall MRN: [id number] Date of Birth: 1929-07-14

## 2019-01-28 NOTE — Telephone Encounter (Signed)
----- Message from Magnus Sinning, MD sent at 01/28/2019 12:53 PM EDT ----- Regarding: FW: update Can you put in Cervical MRI, diagn post laminectomy syndrome (has had ACDF) and ? Myelopathy with weak right leg. ----- Message ----- From: Scheryl Darter, PT Sent: 01/28/2019  11:18 AM EDT To: Magnus Sinning, MD Subject: RE: update                                     Thank you!  Just met with the patient and his daughter.  His daughter asked if you could place a cervical spine MRI order?  She said his father's cervical spine doctor isn't in town anymore?  Let me know what options they have or what you recommend to work up his c-spine.  I am sending you a re-cert for 8 more weeks of PT later today. Thank you! Venetia Night ----- Message ----- From: Magnus Sinning, MD Sent: 01/27/2019  12:47 PM EDT To: Scheryl Darter, PT Subject: RE: update                                     Venetia Night,  Thank you for the updates and working with him.  Of course the easy answer is to please renew his PT as it is appropriate for maintenance with him.  I am not sure why he cannot fire those right hip flexors from a spine standpoint.  There is some foraminal narrowing at L3 on the right on the latest MRI but is not severe.  If he was having pain issue there and they really wanted to try something I would be willing to try an L3 transforaminal injection.  I am not sure that it would help but it could not hurt.  He also has had a significant cervical spine history prior to seeing me.  He has had cervical fusion but has not had recent MRI imaging of the cervical spine.  He could be getting some issue with a myelopathy.  Unfortunately would be not much to do with that except being seen by his prior neurosurgeon Dr. Arnoldo Morale.  Thanks you, Laurence Spates, MD ----- Message ----- From: Scheryl Darter, PT Sent: 01/27/2019  10:52 AM EDT To: Magnus Sinning, MD Subject: update                                         Hi Dr.  Ernestina Patches, I am a physical therapist at [facility] who has been working with Mr. Broyles.  He is up for re-evaluation this week.  He (and his adult daughter) would very much like to continue with PT.    Unfortunately he has not made any notable improvement in the strength of his right hip flexors, making gait with his rollator quite difficult.  He is able to activate Rt hip flexors when spine is un-weighted and gravity is eliminated, but in sitting and standing he cannot perform Rt hip marching or advancement of the limb for gait.  He overuses his Rt arm into his rollator to advance the leg which is causing him some Rt arm pain.  His right hip and entire pelvis is extremely stiff, so he is working against that too.   QUESTION FOR YOU: Would you like  to see him again for any further evaluation or intervention?  I want to let him and his daughter know we have communicated on his behalf.  Please let me know if you recommend anything on my end and/or if you recommend he have another office visit with you.  I am working on mobility, flexibility, strength as able, gait, basic transfers, etc which I do believe are helpful for him overall as I don't think he is very active at home.  I know Medicare right now is justifying "maintenance" if we believe the patient would decline without care, so I would like to renew his PT if you think it's appropriate.    Please let me know your thoughts/recommendations.  I have really enjoyed working with this patient - thank you for the referral!  Sincerely, Baruch Merl, PT

## 2019-02-06 ENCOUNTER — Other Ambulatory Visit: Payer: Self-pay

## 2019-02-06 ENCOUNTER — Ambulatory Visit: Payer: Medicare HMO | Admitting: Physical Therapy

## 2019-02-06 ENCOUNTER — Encounter: Payer: Self-pay | Admitting: Physical Therapy

## 2019-02-06 DIAGNOSIS — M6281 Muscle weakness (generalized): Secondary | ICD-10-CM | POA: Diagnosis not present

## 2019-02-06 DIAGNOSIS — R2689 Other abnormalities of gait and mobility: Secondary | ICD-10-CM

## 2019-02-06 DIAGNOSIS — R293 Abnormal posture: Secondary | ICD-10-CM

## 2019-02-06 NOTE — Therapy (Signed)
[facility] [address], [place], [place], [address] Phone: [phone]   Fax:  [phone]  Physical Therapy Treatment  Patient Details  Name: Seth Mccall MRN: [id number] Date of Birth: 08-13-1929 Referring Provider (PT): Magnus Sinning, MD   Encounter Date: 02/06/2019  PT End of Session - 02/06/19 1000    Visit Number  14    Date for PT Re-Evaluation  03/25/19    Authorization Type  [employer]na Medicare    PT Start Time  [phone]    PT Stop Time  1032    PT Time Calculation (min)  40 min    Activity Tolerance  Patient tolerated treatment well    Behavior During Therapy  [facility] for tasks assessed/performed       Past Medical History:  Diagnosis Date  . Arthritis   . Complication of anesthesia    limited movement in neck because of fusion  . Gout   . Hypertension   . Prostate cancer [facility])     Past Surgical History:  Procedure Laterality Date  . BACK SURGERY  2005   fusion of C3, C4, and C5  . CATARACT EXTRACTION Bilateral   . CORONARY ATHERECTOMY N/A 08/12/2017   Procedure: CORONARY ATHERECTOMY;  Surgeon: Adrian Prows, MD;  Location: [place] [facility];  Service: Cardiovascular;  Laterality: N/A;  . CORONARY STENT INTERVENTION N/A 08/12/2017   Procedure: CORONARY STENT INTERVENTION;  Surgeon: Adrian Prows, MD;  Location: [place] [facility];  Service: Cardiovascular;  Laterality: N/A;  . GREEN LIGHT LASER TURP (TRANSURETHRAL RESECTION OF PROSTATE N/A 10/10/2012   Procedure: GREEN LIGHT LASER TURP (TRANSURETHRAL RESECTION OF PROSTATE;  Surgeon: Claybon Jabs, MD;  [facility];  Service: Urology;  Laterality: N/A;  . LEFT HEART CATH AND CORONARY ANGIOGRAPHY N/A 08/12/2017   Procedure: LEFT HEART CATH AND CORONARY ANGIOGRAPHY;  Surgeon: Adrian Prows, MD;  Location: [place] [facility];  Service: Cardiovascular;  Laterality: N/A;  . PROSTATE BIOPSY    . TRANSURETHRAL RESECTION OF PROSTATE  10-10-2012    There  were no vitals filed for this visit.  Subjective Assessment - 02/06/19 0956    Subjective  Doing ok.  Continuing to have trouble with Rt leg strength with walking.    Pertinent History  fall risk, worsening scoliosis, prostate cancer, heart surgeries, cervical fusion x 3 levels    How long can you sit comfortably?  unlimited    How long can you stand comfortably?  30 min    How long can you walk comfortably?  100 feet    Diagnostic tests  MRI: signif degen arthritis L4/5 but no neural compression, xrays 11/2018, progression of scoliosis as compared to 2016, lumbar multilevel DDD, facet arthoropathy    Patient Stated Goals  get stronger, walk greater distances, correct trunk lean to Rt    Currently in Pain?  No/denies                       [facility] Adult PT Treatment/Exercise - 02/06/19 0001      Ambulation/Gait   Ambulation Distance (Feet)  80 Feet    Gait Comments  end of session: PT noted improved Rt LE clearance with step to pattern of Rt LE, Pt reported greater ease of motion Rt LE for swing phase      Neuro Re-ed    Neuro Re-ed Details   manual resistance exercises with faciliation tapping of hip flexors, quads during hip flexion (  see supine ther ex)      Knee/Hip Exercises: Stretches   Passive Hamstring Stretch  Right;2 reps;30 seconds    Passive Hamstring Stretch Limitations  supine    Quad Stretch  2 reps;30 seconds;Right    Quad Stretch Limitations  in Lt sidelying, passive by PT    Hip Flexor Stretch  Right;2 reps;30 seconds    Hip Flexor Stretch Limitations  in Lt sidelying, passive by PT    Other Knee/Hip Stretches  Rt adductors in supine and sidelying, 2x30 sec, 1 each position      Knee/Hip Exercises: Supine   Short Arc Quad Sets  AROM;Strengthening;Right;10 reps;2 sets    Hip Adduction Isometric  Strengthening;5 reps   manual resistance in 90/90   Hip Adduction Isometric Limitations  with alt abduction isometric in 90/90 manual resistance     Other  Supine Knee/Hip Exercises  hip abduction Rt LE slides on table x 10 reps    Other Supine Knee/Hip Exercises  manual resistance exercises Rt LE marching supine concentric/eccentric       Manual Therapy   Manual Therapy  Passive ROM;Soft tissue mobilization    Soft tissue mobilization  Rt quads, hip flexors, ITB, adductors, hamstrings    Passive ROM  Rt hip: circumduciton, IR/ER, abduction, flexion/abduction, flexion knee to chest               PT Short Term Goals - 01/08/19 1731      PT SHORT TERM GOAL #1   Title  Pt will be ind in intial HEP    Status  Achieved      PT SHORT TERM GOAL #2   Title  Pt will ambulate at least 60 feet with step to or step through gait pattern with rollator without shuffling Rt LE at least 50% of the time.    Baseline  160 feet, at least 75% without shuffling    Status  Achieved      PT SHORT TERM GOAL #3   Title  Pt will perform sit to stand with UEs as needed using proper mechanics, symmetry and no LOB without use of momentum x 5 reps.    Status  On-going        PT Long Term Goals - 02/06/19 1051      Additional Long Term Goals   Additional Long Term Goals  Yes      PT LONG TERM GOAL #6   Title  Pt will achieve Rt hip extension to 10 degrees, abduction to 30 deg, flexion (supine) to 70 degrees to demo greater flexibility for ease of movement of Rt hip for gait swing phase.    Baseline  extension: neutral/zero degrees, abduction 10 deg, flexion 50 deg    Time  4    Period  Weeks    Target Date  03/06/19            Plan - 02/06/19 1044    Clinical Impression Statement  PT focused today on P/ROM in all planes for Rt hip as well as soft tissue mobilization coupled with prolonged stretching of Rt thigh and hip muscles.  PT was able to achieve passive hip extension of 7 degrees (baseline to 0/neutral), abduction of 20 degrees, and hip flexion of 65 degrees (supine) end of session.  Improved mobility of Rt hip translated to improved Rt  foot clearance during swing phase of Rt LE with avoidance of foot shuffle end of session.  Pt reported greater ease of movement for  gait end of session and felt more connected into his hip muscles/flexors.  PT will continue to target improved flexibility and ROM of Rt signif stiff hip and facilitation of strength surrounding the hip.  PT added new goal surrounding Rt hip ROM.    Comorbidities  fall risk, progressive scoliosis    Rehab Potential  Fair    PT Frequency  2x / week    PT Duration  8 weeks    PT Treatment/Interventions  ADLs/Self Care Home Management;Electrical Stimulation;Cryotherapy;Moist Heat;DME Instruction;Gait training;Stair training;Functional mobility training;Therapeutic activities;Therapeutic exercise;Balance training;Neuromuscular re-education;Patient/family education;Manual techniques;Passive range of motion;Spinal Manipulations;Joint Manipulations;Energy conservation;Dry needling    PT Next Visit Plan  continue manual techniques, stretching, ROM of Rt hip, facilitation of strength Rt hip, gait training    PT Home Exercise Plan  Access Code: [email or web address]    Recommended Other Services  re-cert is signed    Consulted and Agree with Plan of Care  Patient       Patient will benefit from skilled therapeutic intervention in order to improve the following deficits and impairments:  Abnormal gait, Decreased coordination, Difficulty walking, Decreased range of motion, Decreased endurance, Decreased activity tolerance, Decreased balance, Hypomobility, Impaired flexibility, Improper body mechanics, Decreased mobility, Decreased strength, Postural dysfunction  Visit Diagnosis: Muscle weakness (generalized)  Abnormal posture  Other abnormalities of gait and mobility     Problem List Patient Active Problem List   Diagnosis Date Noted  . Unstable angina ([place]) 08/10/2017  . Candida infection 08/30/2015  . Malignant neoplasm of prostate ([place]) 04/28/2015  . BPH (benign prostatic  hypertrophy) with urinary obstruction 10/09/2012    Baruch Merl, PT 02/06/19 10:58 AM   [facility] [address], [place], [place], [address] Phone: [phone]   Fax:  [phone]  Name: Seth Mccall MRN: [id number] Date of Birth: [birth year]

## 2019-02-09 ENCOUNTER — Other Ambulatory Visit: Payer: Self-pay

## 2019-02-09 ENCOUNTER — Encounter: Payer: Self-pay | Admitting: Physical Therapy

## 2019-02-09 ENCOUNTER — Ambulatory Visit: Payer: Medicare HMO | Admitting: Physical Therapy

## 2019-02-09 DIAGNOSIS — M6281 Muscle weakness (generalized): Secondary | ICD-10-CM

## 2019-02-09 DIAGNOSIS — R2689 Other abnormalities of gait and mobility: Secondary | ICD-10-CM

## 2019-02-09 DIAGNOSIS — R293 Abnormal posture: Secondary | ICD-10-CM

## 2019-02-09 NOTE — Therapy (Signed)
[facility] [address] [place], [place], [address] Phone: [phone]   Fax:  [phone]  Physical Therapy Treatment  Patient Details  Name: Seth Mccall MRN: [id number] Date of Birth: 10-27-1929 Referring Provider (PT): Magnus Sinning, MD   Encounter Date: 02/09/2019  PT End of Session - 02/09/19 0953    Visit Number  15    Date for PT Re-Evaluation  03/25/19    Authorization Type  A[employer]na Medicare    PT Start Time  0950    PT Stop Time  1032    PT Time Calculation (min)  42 min    Activity Tolerance  Patient tolerated treatment well    Behavior During Therapy  [facility] for tasks assessed/performed       Past Medical History:  Diagnosis Date  . Arthritis   . Complication of anesthesia    limited movement in neck because of fusion  . Gout   . Hypertension   . Prostate cancer [facility])     Past Surgical History:  Procedure Laterality Date  . BACK SURGERY  2005   fusion of C3, C4, and C5  . CATARACT EXTRACTION Bilateral   . CORONARY ATHERECTOMY N/A 08/12/2017   Procedure: CORONARY ATHERECTOMY;  Surgeon: Adrian Prows, MD;  Location: [place] [facility];  Service: Cardiovascular;  Laterality: N/A;  . CORONARY STENT INTERVENTION N/A 08/12/2017   Procedure: CORONARY STENT INTERVENTION;  Surgeon: Adrian Prows, MD;  Location: [place] [facility];  Service: Cardiovascular;  Laterality: N/A;  . GREEN LIGHT LASER TURP (TRANSURETHRAL RESECTION OF PROSTATE N/A 10/10/2012   Procedure: GREEN LIGHT LASER TURP (TRANSURETHRAL RESECTION OF PROSTATE;  Surgeon: Claybon Jabs, MD;  [facility];  Service: Urology;  Laterality: N/A;  . LEFT HEART CATH AND CORONARY ANGIOGRAPHY N/A 08/12/2017   Procedure: LEFT HEART CATH AND CORONARY ANGIOGRAPHY;  Surgeon: Adrian Prows, MD;  Location: [place] [facility];  Service: Cardiovascular;  Laterality: N/A;  . PROSTATE BIOPSY    . TRANSURETHRAL RESECTION OF PROSTATE  10-10-2012    There  were no vitals filed for this visit.  Subjective Assessment - 02/09/19 0952    Subjective  Rt hip felt looser and easier to walk with after last visit but it didn't last very long.    Pertinent History  fall risk, worsening scoliosis, prostate cancer, heart surgeries, cervical fusion x 3 levels    Limitations  Standing;Walking    How long can you sit comfortably?  unlimited    How long can you stand comfortably?  30 min    How long can you walk comfortably?  100 feet    Diagnostic tests  MRI: signif degen arthritis L4/5 but no neural compression, xrays 11/2018, progression of scoliosis as compared to 2016, lumbar multilevel DDD, facet arthoropathy    Patient Stated Goals  get stronger, walk greater distances, correct trunk lean to Rt    Currently in Pain?  No/denies                       [facility] Adult PT Treatment/Exercise - 02/09/19 0001      Ambulation/Gait   Ambulation/Gait  Yes    Ambulation/Gait Assistance  6: Modified independent (Device/Increase time)    Ambulation Distance (Feet)  160 Feet    Assistive device  Rollator    Gait Pattern  Step-to pattern    Gait Comments  PT cued push through Lt LE to initiate Rt swing phase and  provide more clearance for swing      Exercises   Exercises  Knee/Hip;Lumbar      Lumbar Exercises: Aerobic   Nustep  L1 x 6'      Lumbar Exercises: Standing   Other Standing Lumbar Exercises  "stand tall" within rollator 5x5 sec stagger stance bil      Knee/Hip Exercises: Supine   Short Arc Quad Sets  AROM;Strengthening;Both;10 reps    Heel Slides  AROM;Right;10 reps    Other Supine Knee/Hip Exercises  hip flexion isometric 5x5 sec, PT provided min manual resistance due to Pt difficulty performing for self    Other Supine Knee/Hip Exercises  long axis Rt LE hip IR/ER x 20 reps, PT gave TCs for full end range       Knee/Hip Exercises: Sidelying   Hip ABduction Limitations  isometric holds 5x5 sec with min support of LE by PT    Other  Sidelying Knee/Hip Exercises  AA/ROM and A/ROM Rt hip marching knee bent x 10 each      Manual Therapy   Manual Therapy  Passive ROM;Soft tissue mobilization    Soft tissue mobilization  Rt adductors, hamstrings    Passive ROM  Rt hip: circumduciton, IR/ER, abduction, extension, flexion/abduction, flexion knee to chest               PT Short Term Goals - 01/08/19 1731      PT SHORT TERM GOAL #1   Title  Pt will be ind in intial HEP    Status  Achieved      PT SHORT TERM GOAL #2   Title  Pt will ambulate at least 60 feet with step to or step through gait pattern with rollator without shuffling Rt LE at least 50% of the time.    Baseline  160 feet, at least 75% without shuffling    Status  Achieved      PT SHORT TERM GOAL #3   Title  Pt will perform sit to stand with UEs as needed using proper mechanics, symmetry and no LOB without use of momentum x 5 reps.    Status  On-going        PT Long Term Goals - 02/06/19 1051      Additional Long Term Goals   Additional Long Term Goals  Yes      PT LONG TERM GOAL #6   Title  Pt will achieve Rt hip extension to 10 degrees, abduction to 30 deg, flexion (supine) to 70 degrees to demo greater flexibility for ease of movement of Rt hip for gait swing phase.    Baseline  extension: neutral/zero degrees, abduction 10 deg, flexion 50 deg    Time  4    Period  Weeks    Target Date  03/06/19            Plan - 02/09/19 1217    Clinical Impression Statement  PT noted improved hip extension ROM maintained from last visit.  Rt hip continues to be limited in abduction and flexion ROM with signif limiation in hamstring and adductor flexibility.  PT gave Pt new list of ROM and strength to try at home, as well as ongoing cues to push through Lt LE vs Rt UE to initiate Rt swing phase of gait.  Pt performed 160' of ambulation with rollator with improved Rt foot clearance (no shuffle) for step-to pattern end of session with Lt hip extension  and closed chain abductor control for Rt swing  phase.  He will continue to benefit from gait, stretching, ROM and strength training with careful progression of HEP.  PT has had to modify HEP several times to meet the limits of what he can do at home (soft bed, can't get on/off floor, difficulty recruiting Rt hip flexors and quads against gravity.)    PT Frequency  2x / week    PT Duration  8 weeks    PT Treatment/Interventions  ADLs/Self Care Home Management;Electrical Stimulation;Cryotherapy;Moist Heat;DME Instruction;Gait training;Stair training;Functional mobility training;Therapeutic activities;Therapeutic exercise;Balance training;Neuromuscular re-education;Patient/family education;Manual techniques;Passive range of motion;Spinal Manipulations;Joint Manipulations;Energy conservation;Dry needling    PT Next Visit Plan  continue manual techniques, stretching, ROM of Rt hip, facilitation of strength Rt hip, gait training    PT Home Exercise Plan  Access Code: [email or web address]    Consulted and Agree with Plan of Care  Patient       Patient will benefit from skilled therapeutic intervention in order to improve the following deficits and impairments:     Visit Diagnosis: Muscle weakness (generalized)  Abnormal posture  Other abnormalities of gait and mobility     Problem List Patient Active Problem List   Diagnosis Date Noted  . Unstable angina ([place]) 08/10/2017  . Candida infection 08/30/2015  . Malignant neoplasm of prostate ([place]) 04/28/2015  . BPH (benign prostatic hypertrophy) with urinary obstruction 10/09/2012    Baruch Merl, PT 02/09/19 12:23 PM   [facility] [address] [place], [place], [address] Phone: [phone]   Fax:  [phone]  Name: Seth Mccall MRN: [id number] Date of Birth: [birth year]

## 2019-02-09 NOTE — Patient Instructions (Signed)
Laying on your back in bed:  With Rt leg straight, roll whole leg inward and outward 20 times. Slide Rt heel toward your bottom, bending right knee. Do 15. Bend both knees with feet on bed.  Act like you are going to march with Rt hip to turn on the muscles.  Hold 5 seconds, repeat 10 times. Laying flat on back with knees straight or bent, squeeze buttock muscles.  Hold 5 seconds, repeat 10 times.  Turn onto Lt side.  March with Rt knee coming toward chest.  Do 15 times.  When walking, push through Lt foot to clear the Rt foot for swing phase.   Standing within rollator, stand tall through trunk and try to center hips under trunk, hold 5 seconds.  Do this 10 times.

## 2019-02-10 ENCOUNTER — Telehealth: Payer: Self-pay | Admitting: *Deleted

## 2019-02-10 NOTE — Telephone Encounter (Signed)
Called pt lvm #1.  

## 2019-02-13 ENCOUNTER — Ambulatory Visit: Payer: Medicare HMO | Admitting: Physical Therapy

## 2019-02-19 ENCOUNTER — Ambulatory Visit: Payer: Medicare HMO | Admitting: Physical Therapy

## 2019-02-20 ENCOUNTER — Other Ambulatory Visit: Payer: Self-pay

## 2019-02-20 ENCOUNTER — Encounter: Payer: Self-pay | Admitting: Physical Therapy

## 2019-02-20 ENCOUNTER — Ambulatory Visit: Payer: Medicare HMO | Attending: Physical Medicine and Rehabilitation | Admitting: Physical Therapy

## 2019-02-20 DIAGNOSIS — R293 Abnormal posture: Secondary | ICD-10-CM | POA: Insufficient documentation

## 2019-02-20 DIAGNOSIS — R2689 Other abnormalities of gait and mobility: Secondary | ICD-10-CM | POA: Insufficient documentation

## 2019-02-20 DIAGNOSIS — M6281 Muscle weakness (generalized): Secondary | ICD-10-CM | POA: Insufficient documentation

## 2019-02-20 NOTE — Therapy (Signed)
[facility] [address] [place], [place], [address] Phone: [phone]   Fax:  [phone]  Physical Therapy Treatment  Patient Details  Name: Seth Mccall MRN: [id number] Date of Birth: Aug 21, 1929 Referring Provider (PT): Magnus Sinning, MD   Encounter Date: 02/20/2019  PT End of Session - 02/20/19 1040    Visit Number  16    Date for PT Re-Evaluation  03/25/19    Authorization Type  Aetna Medicare    PT Start Time  1032    PT Stop Time  1115    PT Time Calculation (min)  43 min    Activity Tolerance  Patient tolerated treatment well    Behavior During Therapy  [facility] for tasks assessed/performed       Past Medical History:  Diagnosis Date  . Arthritis   . Complication of anesthesia    limited movement in neck because of fusion  . Gout   . Hypertension   . Prostate cancer [facility])     Past Surgical History:  Procedure Laterality Date  . BACK SURGERY  2005   fusion of C3, C4, and C5  . CATARACT EXTRACTION Bilateral   . CORONARY ATHERECTOMY N/A 08/12/2017   Procedure: CORONARY ATHERECTOMY;  Surgeon: Adrian Prows, MD;  Location: [place] [facility];  Service: Cardiovascular;  Laterality: N/A;  . CORONARY STENT INTERVENTION N/A 08/12/2017   Procedure: CORONARY STENT INTERVENTION;  Surgeon: Adrian Prows, MD;  Location: [place] [facility];  Service: Cardiovascular;  Laterality: N/A;  . GREEN LIGHT LASER TURP (TRANSURETHRAL RESECTION OF PROSTATE N/A 10/10/2012   Procedure: GREEN LIGHT LASER TURP (TRANSURETHRAL RESECTION OF PROSTATE;  Surgeon: Claybon Jabs, MD;  [facility];  Service: Urology;  Laterality: N/A;  . LEFT HEART CATH AND CORONARY ANGIOGRAPHY N/A 08/12/2017   Procedure: LEFT HEART CATH AND CORONARY ANGIOGRAPHY;  Surgeon: Adrian Prows, MD;  Location: [place] [facility];  Service: Cardiovascular;  Laterality: N/A;  . PROSTATE BIOPSY    . TRANSURETHRAL RESECTION OF PROSTATE  10-10-2012    There  were no vitals filed for this visit.  Subjective Assessment - 02/20/19 1038    Subjective  I see Dr. Ernestina Patches Oct 14.  I had some Lt thigh pain doing my exercises last week.    Pertinent History  fall risk, worsening scoliosis, prostate cancer, heart surgeries, cervical fusion x 3 levels    How long can you sit comfortably?  unlimited    How long can you stand comfortably?  30 min    How long can you walk comfortably?  100 feet    Diagnostic tests  MRI: signif degen arthritis L4/5 but no neural compression, xrays 11/2018, progression of scoliosis as compared to 2016, lumbar multilevel DDD, facet arthoropathy    Patient Stated Goals  get stronger, walk greater distances, correct trunk lean to Rt    Currently in Pain?  No/denies                       [facility] Adult PT Treatment/Exercise - 02/20/19 0001      Ambulation/Gait   Ambulation/Gait  Yes    Ambulation/Gait Assistance  6: Modified independent (Device/Increase time)    Ambulation Distance (Feet)  160 Feet   2 sets of 80 feet   Assistive device  Rollator    Gait Pattern  Step-to pattern;Step-through pattern    Gait Comments  PT cued push through Lt LE to initiate Rt swing phase  and provide more clearance for swing      Exercises   Exercises  Lumbar;Knee/Hip      Lumbar Exercises: Aerobic   Nustep  L1 x 8', PT present to discuss progress      Knee/Hip Exercises: Seated   Long Arc Quad  Both;2 sets;5 sets    [employer] Limitations  cue to push Lt foot into floor to help with Rt LAQ    Marching  Both;2 sets;5 sets    Sit to [employer]  2 sets;5 reps      Knee/Hip Exercises: Supine   Straight Leg Raises  Both;2 sets;5 reps    Straight Leg Raises Limitations  Pt able to perform ind on Rt for first time today      Manual Therapy   Manual Therapy  Passive ROM   passive stretching: hamstrings, adductors, hip rotators/flex   Passive ROM  Rt hip: circumduciton, IR/ER, abduction, extension, flexion/abduction, flexion knee to  chest               PT Short Term Goals - 01/08/19 1731      PT SHORT TERM GOAL #1   Title  Pt will be ind in intial HEP    Status  Achieved      PT SHORT TERM GOAL #2   Title  Pt will ambulate at least 60 feet with step to or step through gait pattern with rollator without shuffling Rt LE at least 50% of the time.    Baseline  160 feet, at least 75% without shuffling    Status  Achieved      PT SHORT TERM GOAL #3   Title  Pt will perform sit to stand with UEs as needed using proper mechanics, symmetry and no LOB without use of momentum x 5 reps.    Status  On-going        PT Long Term Goals - 02/20/19 1040      PT LONG TERM GOAL #1   Title  Pt will be safe and independent with advanced HEP.    Baseline  trying to do them in his bed    Status  On-going      PT LONG TERM GOAL #2   Title  Pt will achieve community distance ambulation with more erect posture with proper advancement of Rt LE for step to or step through pattern (no shuffling) at least 75% of the time, using rollator or single point cane.    Baseline  using rollator, 240 feet            Plan - 02/20/19 1115    Clinical Impression Statement  Pt was able to perform supine SLR through Rt hip today for first time since starting PT without active assist.  He was able to recruit hip flexors for seated march and demo'ed intermittent improved clearance and swing partial-through vs step to on Rt with gait end of session.  PT continues to mobilize hips and muscles for improved flexiblity and range to decrease resistance to mobility for Pt's ease of movement.  He has signif LE flexibility contraints surrounding bil knees and hips due to sitting most of the day at home.  PT continues to encourage him to perform more frequent sit to stands and household ambulation to improve mobility between visits.  Pt will continue to benefit from skilled PT along POC to improve mobility, transfers, safety, gait endurance and  strength.    Rehab Potential  Fair  PT Frequency  2x / week    PT Duration  8 weeks    PT Treatment/Interventions  ADLs/Self Care Home Management;Electrical Stimulation;Cryotherapy;Moist Heat;DME Instruction;Gait training;Stair training;Functional mobility training;Therapeutic activities;Therapeutic exercise;Balance training;Neuromuscular re-education;Patient/family education;Manual techniques;Passive range of motion;Spinal Manipulations;Joint Manipulations;Energy conservation;Dry needling    PT Next Visit Plan  continue manual techniques, stretching, ROM of Rt hip, facilitation of strength Rt hip, gait training    PT Home Exercise Plan  Access Code: [email or web address]    Consulted and Agree with Plan of Care  Patient       Patient will benefit from skilled therapeutic intervention in order to improve the following deficits and impairments:     Visit Diagnosis: Muscle weakness (generalized)  Abnormal posture  Other abnormalities of gait and mobility     Problem List Patient Active Problem List   Diagnosis Date Noted  . Unstable angina ([place]) 08/10/2017  . Candida infection 08/30/2015  . Malignant neoplasm of prostate ([place]) 04/28/2015  . BPH (benign prostatic hypertrophy) with urinary obstruction 10/09/2012    Baruch Merl, PT 02/20/19 12:07 PM   [facility] [address] [place], [place], [address] Phone: [phone]   Fax:  [phone]  Name: Seth Mccall MRN: [id number] Date of Birth: [birth year]

## 2019-02-21 ENCOUNTER — Other Ambulatory Visit: Payer: Medicare HMO

## 2019-02-23 ENCOUNTER — Other Ambulatory Visit: Payer: Self-pay

## 2019-02-23 ENCOUNTER — Ambulatory Visit: Payer: Medicare HMO | Admitting: Physical Therapy

## 2019-02-23 ENCOUNTER — Encounter: Payer: Self-pay | Admitting: Physical Therapy

## 2019-02-23 DIAGNOSIS — M6281 Muscle weakness (generalized): Secondary | ICD-10-CM

## 2019-02-23 DIAGNOSIS — R2689 Other abnormalities of gait and mobility: Secondary | ICD-10-CM

## 2019-02-23 DIAGNOSIS — R293 Abnormal posture: Secondary | ICD-10-CM

## 2019-02-23 NOTE — Therapy (Signed)
[facility] [address] [place], [place], [address] Phone: [phone]   Fax:  [phone]  Physical Therapy Treatment  Patient Details  Name: Seth Mccall MRN: [id number] Date of Birth: 06/13/1929 Referring Provider (PT): Magnus Sinning, MD   Encounter Date: 02/23/2019  PT End of Session - 02/23/19 1108    Visit Number  17    Date for PT Re-Evaluation  03/25/19    Authorization Type  Aetna Medicare    PT Start Time  1100    PT Stop Time  1145    PT Time Calculation (min)  45 min    Equipment Utilized During Treatment  Other (comment)   rollator   Activity Tolerance  Patient tolerated treatment well    Behavior During Therapy  [facility] for tasks assessed/performed       Past Medical History:  Diagnosis Date  . Arthritis   . Complication of anesthesia    limited movement in neck because of fusion  . Gout   . Hypertension   . Prostate cancer [facility])     Past Surgical History:  Procedure Laterality Date  . BACK SURGERY  2005   fusion of C3, C4, and C5  . CATARACT EXTRACTION Bilateral   . CORONARY ATHERECTOMY N/A 08/12/2017   Procedure: CORONARY ATHERECTOMY;  Surgeon: Adrian Prows, MD;  Location: [place] [facility];  Service: Cardiovascular;  Laterality: N/A;  . CORONARY STENT INTERVENTION N/A 08/12/2017   Procedure: CORONARY STENT INTERVENTION;  Surgeon: Adrian Prows, MD;  Location: [place] [facility];  Service: Cardiovascular;  Laterality: N/A;  . GREEN LIGHT LASER TURP (TRANSURETHRAL RESECTION OF PROSTATE N/A 10/10/2012   Procedure: GREEN LIGHT LASER TURP (TRANSURETHRAL RESECTION OF PROSTATE;  Surgeon: Claybon Jabs, MD;  [facility];  Service: Urology;  Laterality: N/A;  . LEFT HEART CATH AND CORONARY ANGIOGRAPHY N/A 08/12/2017   Procedure: LEFT HEART CATH AND CORONARY ANGIOGRAPHY;  Surgeon: Adrian Prows, MD;  Location: [place] [facility];  Service: Cardiovascular;  Laterality: N/A;  . PROSTATE BIOPSY     . TRANSURETHRAL RESECTION OF PROSTATE  10-10-2012    There were no vitals filed for this visit.  Subjective Assessment - 02/23/19 1106    Subjective  I am trying to be more active at home between appointments.  I am finding it easier to get into the bathtub and with walking.    Pertinent History  fall risk, worsening scoliosis, prostate cancer, heart surgeries, cervical fusion x 3 levels    Limitations  Standing;Walking    How long can you sit comfortably?  unlimited    How long can you stand comfortably?  30 min    How long can you walk comfortably?  160 feet    Diagnostic tests  MRI: signif degen arthritis L4/5 but no neural compression, xrays 11/2018, progression of scoliosis as compared to 2016, lumbar multilevel DDD, facet arthoropathy    Patient Stated Goals  get stronger, walk greater distances, correct trunk lean to Rt    Currently in Pain?  No/denies                       [facility] Adult PT Treatment/Exercise - 02/23/19 0001      Ambulation/Gait   Ambulation/Gait  Yes    Ambulation/Gait Assistance  6: Modified independent (Device/Increase time)    Ambulation Distance (Feet)  200 Feet    Assistive device  Rollator    Gait Pattern  Step-to  pattern;Step-through pattern    Pre-Gait Activities  see neuro re-ed    Gait Comments  PT cued push through Lt LE to initiate Rt swing phase and provide more clearance for swing      Neuro Re-ed    Neuro Re-ed Details   manual resistance exercises hip hip flexion/extension supine bil, Rt LE AA/ROM to simulate swing phase of gait with facilitation tapping Rt hip flexor in Lt sidelying       Exercises   Exercises  Lumbar;Knee/Hip      Lumbar Exercises: Stretches   Passive Hamstring Stretch  Left;Right;30 seconds;2 reps    Designer, fashion/clothing Limitations  passive by PT      Lumbar Exercises: Aerobic   Nustep  L1 x 8', PT present to discuss progress      Knee/Hip Exercises: Standing    Other Standing Knee Exercises  weight shifting into Rt LE 3x10 sec cueing glute med, AA/ROM Rt LE marching toe taps on treadmill edge      Knee/Hip Exercises: Seated   Marching  Strengthening;Both;2 sets;10 reps    Sit to [place]  2 sets;5 reps;without UE support   Pt demo'ing more efficient transfers on first attempt each r     Knee/Hip Exercises: Supine   Straight Leg Raises  Strengthening;Both;10 reps;2 sets    Other Supine Knee/Hip Exercises  glute sets x 15 hooklyiing      Manual Therapy   Manual Therapy  Passive ROM   passive stretching: hamstrings, adductors, hip rotators/flex   Passive ROM  Rt hip: circumduciton, IR/ER, abduction, extension, flexion/abduction, flexion knee to chest               PT Short Term Goals - 01/08/19 1731      PT SHORT TERM GOAL #1   Title  Pt will be ind in intial HEP    Status  Achieved      PT SHORT TERM GOAL #2   Title  Pt will ambulate at least 60 feet with step to or step through gait pattern with rollator without shuffling Rt LE at least 50% of the time.    Baseline  160 feet, at least 75% without shuffling    Status  Achieved      PT SHORT TERM GOAL #3   Title  Pt will perform sit to stand with UEs as needed using proper mechanics, symmetry and no LOB without use of momentum x 5 reps.    Status  On-going        PT Long Term Goals - 02/20/19 1040      PT LONG TERM GOAL #1   Title  Pt will be safe and independent with advanced HEP.    Baseline  trying to do them in his bed    Status  On-going      PT LONG TERM GOAL #2   Title  Pt will achieve community distance ambulation with more erect posture with proper advancement of Rt LE for step to or step through pattern (no shuffling) at least 75% of the time, using rollator or single point cane.    Baseline  using rollator, 240 feet            Plan - 02/23/19 1154    Clinical Impression Statement  Pt reports improving ease with tub transfers and gait advancement of Rt LE.   Gait continues to be slow and step-to>step through but with less resistance given  improving hip ROM and LE flexibility surrounding knee and hip.  Pt was able to perform 2x10 of supine SLR and hip abduction today, demo'ing improving strength and recruitment of Rt hip flexors and abductors.  Pt benefits from passive LE stretching, P/ROM to bil hips, and manual resistance exercises with spine unloaded in supine and sidelying prior to ther ex and gait training.  Hip extension has reached 10 deg, abduction 35 degrees, knee flexion 100 deg with hip in neutral.  PT uses facilitation tapping for hip flexor recruitment as needed.  He will continue to benefit from skilled manual techniques, strength and gait training along current POC to continue maximizing his ability to care for himself safely during daily tasks.    Comorbidities  fall risk, progressive scoliosis    PT Frequency  2x / week    PT Duration  8 weeks    PT Treatment/Interventions  ADLs/Self Care Home Management;Electrical Stimulation;Cryotherapy;Moist Heat;DME Instruction;Gait training;Stair training;Functional mobility training;Therapeutic activities;Therapeutic exercise;Balance training;Neuromuscular re-education;Patient/family education;Manual techniques;Passive range of motion;Spinal Manipulations;Joint Manipulations;Energy conservation;Dry needling    PT Next Visit Plan  continue manual techniques, stretching, ROM of Rt hip, facilitation of strength Rt hip, gait training    PT Home Exercise Plan  Access Code: [email or web address]    Consulted and Agree with Plan of Care  Patient       Patient will benefit from skilled therapeutic intervention in order to improve the following deficits and impairments:     Visit Diagnosis: Muscle weakness (generalized)  Abnormal posture  Other abnormalities of gait and mobility     Problem List Patient Active Problem List   Diagnosis Date Noted  . Unstable angina ([place]) 08/10/2017  . Candida infection  08/30/2015  . Malignant neoplasm of prostate ([place]) 04/28/2015  . BPH (benign prostatic hypertrophy) with urinary obstruction 10/09/2012    Baruch Merl, PT 02/23/19 12:00 PM   [facility] [address] [place], [place], [address] Phone: [phone]   Fax:  [phone]  Name: Seth Mccall MRN: [id number] Date of Birth: May 27, 1929

## 2019-02-25 ENCOUNTER — Other Ambulatory Visit: Payer: Self-pay

## 2019-02-25 ENCOUNTER — Ambulatory Visit: Payer: Medicare HMO | Admitting: Physical Therapy

## 2019-02-25 ENCOUNTER — Encounter: Payer: Self-pay | Admitting: Physical Therapy

## 2019-02-25 DIAGNOSIS — R293 Abnormal posture: Secondary | ICD-10-CM

## 2019-02-25 DIAGNOSIS — R2689 Other abnormalities of gait and mobility: Secondary | ICD-10-CM

## 2019-02-25 DIAGNOSIS — M6281 Muscle weakness (generalized): Secondary | ICD-10-CM | POA: Diagnosis not present

## 2019-02-25 NOTE — Therapy (Signed)
[facility] [address] [place], [place], [address] Phone: [phone]   Fax:  [phone]  Physical Therapy Treatment  Patient Details  Name: Seth Mccall MRN: [id number] Date of Birth: 1930-02-23 Referring Provider (PT): Magnus Sinning, MD   Encounter Date: 02/25/2019  PT End of Session - 02/25/19 1109    Visit Number  18    Date for PT Re-Evaluation  03/25/19    Authorization Type  [employer]    PT Start Time  1105    PT Stop Time  1147    PT Time Calculation (min)  42 min    Activity Tolerance  Patient tolerated treatment well    Behavior During Therapy  [facility] for tasks assessed/performed       Past Medical History:  Diagnosis Date  . Arthritis   . Complication of anesthesia    limited movement in neck because of fusion  . Gout   . Hypertension   . Prostate cancer [facility])     Past Surgical History:  Procedure Laterality Date  . BACK SURGERY  2005   fusion of C3, C4, and C5  . CATARACT EXTRACTION Bilateral   . CORONARY ATHERECTOMY N/A 08/12/2017   Procedure: CORONARY ATHERECTOMY;  Surgeon: Adrian Prows, MD;  Location: [place] [facility];  Service: Cardiovascular;  Laterality: N/A;  . CORONARY STENT INTERVENTION N/A 08/12/2017   Procedure: CORONARY STENT INTERVENTION;  Surgeon: Adrian Prows, MD;  Location: [place] [facility];  Service: Cardiovascular;  Laterality: N/A;  . GREEN LIGHT LASER TURP (TRANSURETHRAL RESECTION OF PROSTATE N/A 10/10/2012   Procedure: GREEN LIGHT LASER TURP (TRANSURETHRAL RESECTION OF PROSTATE;  Surgeon: Claybon Jabs, MD;  Location: [facility];  Service: Urology;  Laterality: N/A;  . LEFT HEART CATH AND CORONARY ANGIOGRAPHY N/A 08/12/2017   Procedure: LEFT HEART CATH AND CORONARY ANGIOGRAPHY;  Surgeon: Adrian Prows, MD;  Location: [place] [facility];  Service: Cardiovascular;  Laterality: N/A;  . PROSTATE BIOPSY    . TRANSURETHRAL RESECTION OF PROSTATE  10-10-2012    There  were no vitals filed for this visit.  Subjective Assessment - 02/25/19 1108    Subjective  I was tired after last visit. Had some pain in left thigh muscle after last visit but it's gone now.    Pertinent History  fall risk, worsening scoliosis, prostate cancer, heart surgeries, cervical fusion x 3 levels    Limitations  Standing;Walking    How long can you sit comfortably?  unlimited    How long can you stand comfortably?  30 min    How long can you walk comfortably?  160 feet    Diagnostic tests  MRI: signif degen arthritis L4/5 but no neural compression, xrays 11/2018, progression of scoliosis as compared to 2016, lumbar multilevel DDD, facet arthoropathy    Patient Stated Goals  get stronger, walk greater distances, correct trunk lean to Rt    Currently in Pain?  No/denies                       [facility] Adult PT Treatment/Exercise - 02/25/19 0001      Neuro Re-ed    Neuro Re-ed Details   manual resistance exercises hip hip flexion/extension supine bil, Rt LE AA/ROM to simulate swing phase of gait with facilitation tapping Rt hip flexor in Lt sidelying       Exercises   Exercises  Lumbar;Knee/Hip      Lumbar Exercises: Stretches  Passive Hamstring Stretch  Left;Right;30 seconds;2 reps    Designer, fashion/clothing Limitations  passive by PT      Lumbar Exercises: Aerobic   Nustep  L2 x 6', PT present to monitor and discuss progress      Knee/Hip Exercises: Seated   Sit to Sand  2 sets;5 reps;with UE support      Knee/Hip Exercises: Supine   Straight Leg Raises  Strengthening;Right;2 sets;10 reps    Other Supine Knee/Hip Exercises  hooklying marching alt bil LEs 2x20    Other Supine Knee/Hip Exercises  hooklying hip abd with green band around knees with glute squeeze 2x10 reps      Knee/Hip Exercises: Sidelying   Other Sidelying Knee/Hip Exercises  Rt LE AA/ROM bicycle in Lt sidelying      Manual Therapy   Manual Therapy  Passive  ROM;Soft tissue mobilization   passive stretching: hamstrings, adductors, hip IR/ER/flexion   Soft tissue mobilization  Rt adductors, hamstrings    Passive ROM  Rt hip: circumduciton, IR/ER, abduction, extension, flexion/abduction, flexion knee to chest               PT Short Term Goals - 01/08/19 1731      PT SHORT TERM GOAL #1   Title  Pt will be ind in intial HEP    Status  Achieved      PT SHORT TERM GOAL #2   Title  Pt will ambulate at least 60 feet with step to or step through gait pattern with rollator without shuffling Rt LE at least 50% of the time.    Baseline  160 feet, at least 75% without shuffling    Status  Achieved      PT SHORT TERM GOAL #3   Title  Pt will perform sit to stand with UEs as needed using proper mechanics, symmetry and no LOB without use of momentum x 5 reps.    Status  On-going        PT Long Term Goals - 02/25/19 1110      PT LONG TERM GOAL #1   Title  Pt will be safe and independent with advanced HEP.    Baseline  trying to do them in his bed    Status  On-going      PT LONG TERM GOAL #2   Title  Pt will achieve community distance ambulation with more erect posture with proper advancement of Rt LE for step to or step through pattern (no shuffling) at least 75% of the time, using rollator or single point cane.    Baseline  household community only for now    Status  On-going      PT LONG TERM GOAL #3   Title  Pt will improve TUG time to </= 40 sec to demo improved functional mobility and less fall risk.      PT LONG TERM GOAL #4   Title  Pt will improve 5x sit to stand to </= 25 sec to demo improved functional mobility and less fall risk.    Baseline  27 sec 02/25/19    Status  On-going      PT LONG TERM GOAL #5   Title  Pt will improve LE strength to at least 4+/5 throughout bil LEs to improve safety and functional mobility for daily tasks such as getting in/out of tub.    Status  On-going  Plan - 02/25/19 1150     Clinical Impression Statement  Pt reports improved ability to get in/out of tub with improved Rt hip flexibiity and improving recruitment of Rt hip flexor.  He continues to be able to perform increasing reps of Rt hip strength in supine and sidelying.  He demonstrated 5x sit to stand in 27 sec today with imrpoved body mechanics for successful first attempt for all transfers, nearly meeting goal of 25 sec.  He was able to use Lt LE glutes in gait to assist with at least 50% step through pattern with Rt LE with gait today x 160' with rollator. Pt will continue to benefit from current POC for gait, strength, ROM, flexibility to ease daily tasks and improve gait efficiency.    Comorbidities  fall risk, progressive scoliosis    Rehab Potential  Fair    PT Frequency  2x / week    PT Duration  8 weeks    PT Treatment/Interventions  ADLs/Self Care Home Management;Electrical Stimulation;Cryotherapy;Moist Heat;DME Instruction;Gait training;Stair training;Functional mobility training;Therapeutic activities;Therapeutic exercise;Balance training;Neuromuscular re-education;Patient/family education;Manual techniques;Passive range of motion;Spinal Manipulations;Joint Manipulations;Energy conservation;Dry needling    PT Next Visit Plan  continue manual techniques, stretching, ROM of Rt hip, facilitation of strength Rt hip, gait training    PT Home Exercise Plan  Access Code: [email or web address]    Consulted and Agree with Plan of Care  Patient       Patient will benefit from skilled therapeutic intervention in order to improve the following deficits and impairments:     Visit Diagnosis: Muscle weakness (generalized)  Abnormal posture  Other abnormalities of gait and mobility     Problem List Patient Active Problem List   Diagnosis Date Noted  . Unstable angina ([place]) 08/10/2017  . Candida infection 08/30/2015  . Malignant neoplasm of prostate ([place]) 04/28/2015  . BPH (benign prostatic hypertrophy) with urinary  obstruction 10/09/2012   Baruch Merl, PT 02/25/19 11:54 AM   [facility] [address] [place], [place], [address] Phone: [phone]   Fax:  [phone]  Name: Seth Mccall MRN: [id number] Date of Birth: 1930-02-12

## 2019-03-02 ENCOUNTER — Ambulatory Visit: Payer: Medicare HMO | Admitting: Physical Therapy

## 2019-03-04 ENCOUNTER — Other Ambulatory Visit: Payer: Self-pay

## 2019-03-04 ENCOUNTER — Ambulatory Visit (INDEPENDENT_AMBULATORY_CARE_PROVIDER_SITE_OTHER): Payer: Medicare HMO | Admitting: Physical Medicine and Rehabilitation

## 2019-03-04 ENCOUNTER — Encounter: Payer: Self-pay | Admitting: Physical Therapy

## 2019-03-04 ENCOUNTER — Encounter: Payer: Self-pay | Admitting: Physical Medicine and Rehabilitation

## 2019-03-04 ENCOUNTER — Ambulatory Visit: Payer: Medicare HMO | Admitting: Physical Therapy

## 2019-03-04 VITALS — BP 119/68 | HR 57 | Ht 63.0 in | Wt 144.0 lb

## 2019-03-04 DIAGNOSIS — R531 Weakness: Secondary | ICD-10-CM

## 2019-03-04 DIAGNOSIS — M6281 Muscle weakness (generalized): Secondary | ICD-10-CM | POA: Diagnosis not present

## 2019-03-04 DIAGNOSIS — M545 Low back pain, unspecified: Secondary | ICD-10-CM

## 2019-03-04 DIAGNOSIS — R293 Abnormal posture: Secondary | ICD-10-CM

## 2019-03-04 DIAGNOSIS — M419 Scoliosis, unspecified: Secondary | ICD-10-CM

## 2019-03-04 DIAGNOSIS — M4802 Spinal stenosis, cervical region: Secondary | ICD-10-CM

## 2019-03-04 DIAGNOSIS — M961 Postlaminectomy syndrome, not elsewhere classified: Secondary | ICD-10-CM

## 2019-03-04 DIAGNOSIS — R2689 Other abnormalities of gait and mobility: Secondary | ICD-10-CM

## 2019-03-04 NOTE — Therapy (Signed)
[facility] [address] [place], [place], [address] Phone: [phone]   Fax:  [phone]  Physical Therapy Treatment  Patient Details  Name: Seth Mccall MRN: [id number] Date of Birth: [birth year] Referring Provider (PT): Magnus Sinning, MD   Encounter Date: 03/04/2019  PT End of Session - 03/04/19 1107    Visit Number  19    Date for PT Re-Evaluation  03/25/19    Authorization Type  [employer] Medicare    PT Start Time  1100    PT Stop Time  1143    PT Time Calculation (min)  43 min    Equipment Utilized During Treatment  Other (comment)   rollator   Activity Tolerance  Patient tolerated treatment well    Behavior During Therapy  [facility] for tasks assessed/performed       Past Medical History:  Diagnosis Date  . Arthritis   . Complication of anesthesia    limited movement in neck because of fusion  . Gout   . Hypertension   . Prostate cancer [facility])     Past Surgical History:  Procedure Laterality Date  . BACK SURGERY  2005   fusion of C3, C4, and C5  . CATARACT EXTRACTION Bilateral   . CORONARY ATHERECTOMY N/A 08/12/2017   Procedure: CORONARY ATHERECTOMY;  Surgeon: Adrian Prows, MD;  Location: [place] [facility];  Service: Cardiovascular;  Laterality: N/A;  . CORONARY STENT INTERVENTION N/A 08/12/2017   Procedure: CORONARY STENT INTERVENTION;  Surgeon: Adrian Prows, MD;  Location: [place] [facility];  Service: Cardiovascular;  Laterality: N/A;  . GREEN LIGHT LASER TURP (TRANSURETHRAL RESECTION OF PROSTATE N/A 10/10/2012   Procedure: GREEN LIGHT LASER TURP (TRANSURETHRAL RESECTION OF PROSTATE;  Surgeon: Claybon Jabs, MD;  [facility];  Service: Urology;  Laterality: N/A;  . LEFT HEART CATH AND CORONARY ANGIOGRAPHY N/A 08/12/2017   Procedure: LEFT HEART CATH AND CORONARY ANGIOGRAPHY;  Surgeon: Adrian Prows, MD;  Location: [place] [facility];  Service: Cardiovascular;  Laterality: N/A;  . PROSTATE BIOPSY     . TRANSURETHRAL RESECTION OF PROSTATE  10-10-2012    There were no vitals filed for this visit.  Subjective Assessment - 03/04/19 1107    Subjective  Doing ok.  My son is in town visiting.    Pertinent History  fall risk, worsening scoliosis, prostate cancer, heart surgeries, cervical fusion x 3 levels    Limitations  Standing;Walking    How long can you sit comfortably?  unlimited    How long can you stand comfortably?  30 min    How long can you walk comfortably?  160 feet    Diagnostic tests  MRI: signif degen arthritis L4/5 but no neural compression, xrays 11/2018, progression of scoliosis as compared to 2016, lumbar multilevel DDD, facet arthoropathy    Patient Stated Goals  get stronger, walk greater distances, correct trunk lean to Rt    Currently in Pain?  No/denies         [facility] PT Assessment - 03/04/19 0001      ROM / Strength   AROM / PROM / Strength  PROM;Strength      AROM   Overall AROM Comments  Rt hip ext 10 deg, abd 30 deg, flexion 45 (hamstring limits), ER 45, IR 30      Strength   Overall Strength Comments  Rt hip flexors 3/5  [place] Adult PT Treatment/Exercise - 03/04/19 0001      Ambulation/Gait   Ambulation/Gait  Yes    Ambulation/Gait Assistance  6: Modified independent (Device/Increase time)    Ambulation Distance (Feet)  160 Feet    Assistive device  Rollator    Gait Pattern  Step-to pattern;Step-through pattern    Gait Comments  70% step to, 30% step through, PT cues push through Lt LE to assist with advancement of Rt LE swing      Exercises   Exercises  Lumbar;Knee/Hip      Lumbar Exercises: Aerobic   Nustep  L1 x 5' PT present to discuss progress      Knee/Hip Exercises: Seated   [employer]  AROM;Strengthening;Right;2 sets;10 Education administrator Limitations  bil    Sit to [employer]  with UE support;3 sets;5 reps      Knee/Hip Exercises: Supine   Straight Leg Raises   Strengthening;Both;2 sets;10 reps      Knee/Hip Exercises: Sidelying   Hip ABduction  Strengthening;Right;3 sets;5 reps      Manual Therapy   Passive ROM  Rt hip ROM: circumduciton, IR/ER, abduction, extension, flexion/abduction, flexion knee to chest               PT Short Term Goals - 01/08/19 1731      PT SHORT TERM GOAL #1   Title  Pt will be ind in intial HEP    Status  Achieved      PT SHORT TERM GOAL #2   Title  Pt will ambulate at least 60 feet with step to or step through gait pattern with rollator without shuffling Rt LE at least 50% of the time.    Baseline  160 feet, at least 75% without shuffling    Status  Achieved      PT SHORT TERM GOAL #3   Title  Pt will perform sit to stand with UEs as needed using proper mechanics, symmetry and no LOB without use of momentum x 5 reps.    Status  On-going        PT Long Term Goals - 03/04/19 1116      PT LONG TERM GOAL #4   Title  Pt will improve 5x sit to stand to </= 25 sec to demo improved functional mobility and less fall risk.    Baseline  22 sec    Status  Achieved            Plan - 03/04/19 1150    Clinical Impression Statement  Pt verbalizes he is starting to feel like he is learning how to use his Rt LE better.  Getting in/out of tub is improved.  He was able to perform 2x10 SLR with Rt LE today and met his goal of 5x sit to stand, performing this in 22 sec today (goal 25 sec).  He was 100% successful on 15 trials of sit to stand achieving standing on first try each rep with good body mechanics.  His Rt hip continues to be very stiff with combined limitations of LE flexibility and hip capsule tightness, but this is improving with manual P/ROM and stretching by PT.  Pt can demo improved step through pattern approx 30% of the time but needs frequent cueing to use Lt LE gluteals to faciliate Rt swing phase.  Pt will continue to benefit from skilled PT along current POC to prevent decline and improve safety  and independence in daily tasks.    Comorbidities  fall risk, progressive scoliosis    Rehab Potential  Fair    PT Frequency  2x / week    PT Duration  8 weeks    PT Treatment/Interventions  ADLs/Self Care Home Management;Electrical Stimulation;Cryotherapy;Moist Heat;DME Instruction;Gait training;Stair training;Functional mobility training;Therapeutic activities;Therapeutic exercise;Balance training;Neuromuscular re-education;Patient/family education;Manual techniques;Passive range of motion;Spinal Manipulations;Joint Manipulations;Energy conservation;Dry needling    PT Next Visit Plan  continue manual techniques, stretching, ROM of Rt hip, facilitation of strength Rt hip, gait training    PT Home Exercise Plan  Access Code: [email or web address]    Consulted and Agree with Plan of Care  Patient       Patient will benefit from skilled therapeutic intervention in order to improve the following deficits and impairments:  Abnormal gait, Decreased coordination, Difficulty walking, Decreased range of motion, Decreased endurance, Decreased activity tolerance, Decreased balance, Hypomobility, Impaired flexibility, Improper body mechanics, Decreased mobility, Decreased strength, Postural dysfunction  Visit Diagnosis: Muscle weakness (generalized)  Abnormal posture  Other abnormalities of gait and mobility     Problem List Patient Active Problem List   Diagnosis Date Noted  . Unstable angina ([place]) 08/10/2017  . Candida infection 08/30/2015  . Malignant neoplasm of prostate ([place]) 04/28/2015  . BPH (benign prostatic hypertrophy) with urinary obstruction 10/09/2012    Baruch Merl, PT 03/04/19 11:56 AM    [facility] [address] [place], [place], [address] Phone: [phone]   Fax:  [phone]  Name: Seth Mccall MRN: [id number] Date of Birth: 1930-05-03

## 2019-03-04 NOTE — Progress Notes (Signed)
 .  Numeric Pain Rating Scale and Functional Assessment Average Pain 0 Pain Right Now 0 My pain is weakness Pain is worse with: walking Pain improves with: nothing   In the last MONTH (on 0-10 scale) has pain interfered with the following?  1. General activity like being  able to carry out your everyday physical activities such as walking, climbing stairs, carrying groceries, or moving a chair?  Rating(4)  2. Relation with others like being able to carry out your usual social activities and roles such as  activities at home, at work and in your community. Rating(4)  3. Enjoyment of life such that you have  been bothered by emotional problems such as feeling anxious, depressed or irritable?  Rating(4)

## 2019-03-09 ENCOUNTER — Ambulatory Visit: Payer: Medicare HMO | Admitting: Physical Therapy

## 2019-03-09 ENCOUNTER — Encounter: Payer: Self-pay | Admitting: Physical Therapy

## 2019-03-09 ENCOUNTER — Other Ambulatory Visit: Payer: Self-pay

## 2019-03-09 DIAGNOSIS — R2689 Other abnormalities of gait and mobility: Secondary | ICD-10-CM

## 2019-03-09 DIAGNOSIS — R293 Abnormal posture: Secondary | ICD-10-CM

## 2019-03-09 DIAGNOSIS — M6281 Muscle weakness (generalized): Secondary | ICD-10-CM

## 2019-03-09 NOTE — Therapy (Signed)
[facility] [address] [place], [place], [address] Phone: [phone]   Fax:  [phone]  Physical Therapy Treatment  Patient Details  Name: Seth Mccall MRN: [id number] Date of Birth: 12-Mar-1930 Referring Provider (PT): Magnus Sinning, MD  Progress Note Reporting Period 01/14/19 to 03/09/19 20th visit Progress Note  See note below for Objective Data and Assessment of Progress/Goals.       Encounter Date: 03/09/2019  PT End of Session - 03/09/19 1104    Visit Number  20    Date for PT Re-Evaluation  03/25/19    Authorization Type  [employer] Medicare    PT Start Time  1104    PT Stop Time  1145    PT Time Calculation (min)  41 min    Equipment Utilized During Treatment  Other (comment)   rollator   Activity Tolerance  Patient tolerated treatment well    Behavior During Therapy  WFL for tasks assessed/performed       Past Medical History:  Diagnosis Date  . Arthritis   . Complication of anesthesia    limited movement in neck because of fusion  . Gout   . Hypertension   . Prostate cancer [facility])     Past Surgical History:  Procedure Laterality Date  . BACK SURGERY  2005   fusion of C3, C4, and C5  . CATARACT EXTRACTION Bilateral   . CORONARY ATHERECTOMY N/A 08/12/2017   Procedure: CORONARY ATHERECTOMY;  Surgeon: Adrian Prows, MD;  Location: [place] [facility];  Service: Cardiovascular;  Laterality: N/A;  . CORONARY STENT INTERVENTION N/A 08/12/2017   Procedure: CORONARY STENT INTERVENTION;  Surgeon: Adrian Prows, MD;  Location: [place] [facility];  Service: Cardiovascular;  Laterality: N/A;  . GREEN LIGHT LASER TURP (TRANSURETHRAL RESECTION OF PROSTATE N/A 10/10/2012   Procedure: GREEN LIGHT LASER TURP (TRANSURETHRAL RESECTION OF PROSTATE;  Surgeon: Claybon Jabs, MD;  Location: [facility];  Service: Urology;  Laterality: N/A;  . LEFT HEART CATH AND CORONARY ANGIOGRAPHY N/A 08/12/2017   Procedure:  LEFT HEART CATH AND CORONARY ANGIOGRAPHY;  Surgeon: Adrian Prows, MD;  Location: [place] [facility];  Service: Cardiovascular;  Laterality: N/A;  . PROSTATE BIOPSY    . TRANSURETHRAL RESECTION OF PROSTATE  10-10-2012    There were no vitals filed for this visit.      [facility] PT Assessment - 03/09/19 0001      Assessment   Medical Diagnosis  M41.9 (ICD-10-CM) - Scoliosis, unspecified scoliosis type, unspecified spinal region, M21.70 (ICD-10-CM) - Leg length discrepancy, R53.1 (ICD-10-CM) - Weakness, M48.061 (ICD-10-CM) - Spinal stenosis of lumbar region without neurogenic claudication    Referring Provider (PT)  Magnus Sinning, MD    Onset Date/Surgical Date  --   weakness started after covid-19, dec activity   Hand Dominance  Right    Next MD Visit  no    Prior Therapy  home PT      ROM / Strength   AROM / PROM / Strength  Strength      AROM   Overall AROM Comments  Rt hip ext 10 deg, abd 30 deg, flexion 45 (hamstring limits), ER 45, IR 30      Strength   Strength Assessment Site  Hip;Knee    Right/Left Hip  Right;Left    Right Hip Flexion  3/5    Right Hip Extension  4-/5    Right Hip External Rotation   3+/5    Right Hip  Internal Rotation  3+/5    Right Hip ABduction  3+/5    Right Hip ADduction  4-/5    Left Hip Flexion  4-/5    Left Hip Extension  4/5    Left Hip External Rotation  4-/5    Left Hip Internal Rotation  4-/5    Left Hip ABduction  3+/5    Left Hip ADduction  4-/5    Right/Left Knee  Right;Left    Right Knee Flexion  4-/5    Right Knee Extension  4-/5    Left Knee Flexion  4/5    Left Knee Extension  4/5      Transfers   Five time sit to stand comments   22      6 minute walk test results    Aerobic Endurance Distance Walked  200    Endurance additional comments  with rollator, flexed trunk with pelvic obliquity and Rt trunk lean, Rt LE poor/fair foot clearance with Rt LE shuffle in step to pattern majority of time, worsens with fatigue after approx  60'      Timed Up and Go Test   TUG  Normal TUG    Normal TUG (seconds)  46    TUG Comments  with rollator      Functional Gait  Assessment   Gait assessed   Yes                   [facility] Adult PT Treatment/Exercise - 03/09/19 0001      Ambulation/Gait   Ambulation/Gait  Yes    Ambulation/Gait Assistance  6: Modified independent (Device/Increase time)    Ambulation Distance (Feet)  240 Feet    Assistive device  Rollator    Gait Pattern  Step-to pattern;Step-through pattern;Decreased step length - left;Decreased step length - right    Ambulation Surface  Level    Gait Comments  Rt LE step through first 8' with fair clearance of Rt LE, then fatigues to consistent step to pattern with Rt LE shuffle 100% of time      Knee/Hip Exercises: Seated   Long Arc Quad  Strengthening;Both;10 reps;2 sets    Marching  Strengthening;Both;20 reps    Marching Limitations  alt bil    Sit to [employer]  with UE support;5 reps;2 sets      Knee/Hip Exercises: Supine   Straight Leg Raises  Strengthening;Both;2 sets;10 reps    Straight Leg Raises Limitations  small range on Rt LE due to weakness and fatigue      Knee/Hip Exercises: Sidelying   Hip ABduction  Strengthening;Right;2 sets;5 reps    Other Sidelying Knee/Hip Exercises  Rt hip and knee bicycle x 10 reps      Manual Therapy   Passive ROM  Rt hip flexion, abduction, ER/IR, extension, passive stretch Rt quad and hip flexor in SL, hamstring and adductors in supine               PT Short Term Goals - 03/09/19 1210      PT SHORT TERM GOAL #1   Title  Pt will be ind in intial HEP    Status  Achieved      PT SHORT TERM GOAL #2   Title  Pt will ambulate at least 60 feet with step to or step through gait pattern with rollator without shuffling Rt LE at least 50% of the time.    Baseline  covers 55' with mostly step/shuffle to pattern, occassional foot clearance  before fatigues    Status  Partially Met      PT SHORT TERM GOAL  #3   Title  Pt will perform sit to stand with UEs as needed using proper mechanics, symmetry and no LOB without use of momentum x 5 reps.    Status  Achieved        PT Long Term Goals - 03/09/19 1105      PT LONG TERM GOAL #1   Title  Pt will be safe and independent with advanced HEP.    Baseline  trying to do them in his bed    Status  On-going      PT LONG TERM GOAL #2   Title  Pt will achieve community distance ambulation with more erect posture with proper advancement of Rt LE for step to or step through pattern (no shuffling) at least 75% of the time, using rollator or single point cane.    Baseline  household community only for now, step to with shuffle gait Rt LE at least 70% of time with fatigue    Status  On-going      PT LONG TERM GOAL #3   Title  Pt will improve TUG time to </= 40 sec to demo improved functional mobility and less fall risk.    Baseline  46 sec 03/09/19    Status  On-going      PT LONG TERM GOAL #4   Title  Pt will improve 5x sit to stand to </= 25 sec to demo improved functional mobility and less fall risk.    Baseline  22 sec    Status  Achieved      PT LONG TERM GOAL #5   Title  Pt will improve LE strength to at least 4+/5 throughout bil LEs to improve safety and functional mobility for daily tasks such as getting in/out of tub.    Baseline  Rt hip flexors continued weakness of 3/5, knee extension 4-/5, Rt hip abduction 4-/5, Rt hip ext 4-/5    Status  On-going            Plan - 03/09/19 1202    Clinical Impression Statement  Pt continues to display Rt LE weakness affecting gait.  He performed 6MWT with rollator covering 200' with limited/poor Rt LE foot clearance, using shuffle-to pattern increasingly with fatigue.  Pt covered 240' today with decreasing gait velocity and efficiency due to Rt LE fatigue: first 56' in 2:00, second 61' in 2:15, third 49' in 2:25 with increasing shuffling of Rt LE and shorter strides.  TUG test has improved from  56" to 46" for TUG using rollator.  Pt performs 5x sit to stand in 22 sec with UE support and consistent success on first trial of transfer each rep.  Rt LE strength deficits are present especially in standing, affecting gait, ranging from 3/5 to 4-/5.  [name] has reported they plan to move Pt into assisted living and will be seeking a motorized W/C for him to cover distance of long hallways to meals, activities, etc.  Pt will continue to benefit from ongoing skilled PT along POC to prevent regression of function and improve safety, reduction of fall risk, gait efficiency and LE strength.    Comorbidities  fall risk, progressive scoliosis    Rehab Potential  Fair    PT Frequency  2x / week    PT Duration  8 weeks    PT Treatment/Interventions  ADLs/Self Care Home Management;Electrical Stimulation;Cryotherapy;Moist  Heat;DME Instruction;Gait training;Stair training;Functional mobility training;Therapeutic activities;Therapeutic exercise;Balance training;Neuromuscular re-education;Patient/family education;Manual techniques;Passive range of motion;Spinal Manipulations;Joint Manipulations;Energy conservation;Dry needling    PT Next Visit Plan  continue manual techniques, stretching, ROM of Rt hip, facilitation of strength Rt hip, gait training    PT Home Exercise Plan  Access Code: [email or web address]    Consulted and Agree with Plan of Care  Patient       Patient will benefit from skilled therapeutic intervention in order to improve the following deficits and impairments:  Abnormal gait, Decreased coordination, Difficulty walking, Decreased range of motion, Decreased endurance, Decreased activity tolerance, Decreased balance, Hypomobility, Impaired flexibility, Improper body mechanics, Decreased mobility, Decreased strength, Postural dysfunction  Visit Diagnosis: Muscle weakness (generalized)  Abnormal posture  Other abnormalities of gait and mobility     Problem List Patient Active Problem List    Diagnosis Date Noted  . Unstable angina ([place]) 08/10/2017  . Candida infection 08/30/2015  . Malignant neoplasm of prostate ([place]) 04/28/2015  . BPH (benign prostatic hypertrophy) with urinary obstruction 10/09/2012   Baruch Merl, PT 03/09/19 12:17 PM    [facility] [address], [place], [place], [address] Phone: [phone]   Fax:  [phone]  Name: Elray PSALM SCHAPPELL MRN: [id number] Date of Birth: 1930-05-15

## 2019-03-11 ENCOUNTER — Other Ambulatory Visit: Payer: Self-pay

## 2019-03-11 ENCOUNTER — Encounter: Payer: Self-pay | Admitting: Physical Therapy

## 2019-03-11 ENCOUNTER — Ambulatory Visit: Payer: Medicare HMO | Admitting: Physical Therapy

## 2019-03-11 DIAGNOSIS — M6281 Muscle weakness (generalized): Secondary | ICD-10-CM | POA: Diagnosis not present

## 2019-03-11 DIAGNOSIS — R2689 Other abnormalities of gait and mobility: Secondary | ICD-10-CM

## 2019-03-11 DIAGNOSIS — R293 Abnormal posture: Secondary | ICD-10-CM

## 2019-03-11 NOTE — Therapy (Signed)
[facility] [address] [place], [place], [address] Phone: [phone]   Fax:  [phone]  Physical Therapy Treatment  Patient Details  Name: Seth Mccall MRN: [id number] Date of Birth: 01/17/1930 Referring Provider (PT): Magnus Sinning, MD   Encounter Date: 03/11/2019  PT End of Session - 03/11/19 1134    Visit Number  21    Date for PT Re-Evaluation  03/25/19    Authorization Type  [employer]na Medicare    PT Start Time  1100    PT Stop Time  1143    PT Time Calculation (min)  43 min    Equipment Utilized During Treatment  Other (comment)    Activity Tolerance  Patient tolerated treatment well;Patient limited by lethargy;Patient limited by fatigue    Behavior During Therapy  [facility] for tasks assessed/performed       Past Medical History:  Diagnosis Date  . Arthritis   . Complication of anesthesia    limited movement in neck because of fusion  . Gout   . Hypertension   . Prostate cancer [facility])     Past Surgical History:  Procedure Laterality Date  . BACK SURGERY  2005   fusion of C3, C4, and C5  . CATARACT EXTRACTION Bilateral   . CORONARY ATHERECTOMY N/A 08/12/2017   Procedure: CORONARY ATHERECTOMY;  Surgeon: Adrian Prows, MD;  Location: [place] [facility];  Service: Cardiovascular;  Laterality: N/A;  . CORONARY STENT INTERVENTION N/A 08/12/2017   Procedure: CORONARY STENT INTERVENTION;  Surgeon: Adrian Prows, MD;  Location: [place] [facility];  Service: Cardiovascular;  Laterality: N/A;  . GREEN LIGHT LASER TURP (TRANSURETHRAL RESECTION OF PROSTATE N/A 10/10/2012   Procedure: GREEN LIGHT LASER TURP (TRANSURETHRAL RESECTION OF PROSTATE;  Surgeon: Claybon Jabs, MD;  Location: [facility];  Service: Urology;  Laterality: N/A;  . LEFT HEART CATH AND CORONARY ANGIOGRAPHY N/A 08/12/2017   Procedure: LEFT HEART CATH AND CORONARY ANGIOGRAPHY;  Surgeon: Adrian Prows, MD;  Location: [place] [facility];  Service:  Cardiovascular;  Laterality: N/A;  . PROSTATE BIOPSY    . TRANSURETHRAL RESECTION OF PROSTATE  10-10-2012    There were no vitals filed for this visit.  Subjective Assessment - 03/11/19 1114    Subjective  My legs gave out on me last night and I didn't fall but had to lower myself to the ground.  I had my wife bring me a chair and I had to work very hard to crawl back up off the floor.    Pertinent History  fall risk, worsening scoliosis, prostate cancer, heart surgeries, cervical fusion x 3 levels    Limitations  Standing;Walking    How long can you sit comfortably?  unlimited    How long can you stand comfortably?  30 min    How long can you walk comfortably?  240', slow, with rollator    Diagnostic tests  MRI: signif degen arthritis L4/5 but no neural compression, xrays 11/2018, progression of scoliosis as compared to 2016, lumbar multilevel DDD, facet arthoropathy    Patient Stated Goals  get stronger, walk greater distances, correct trunk lean to Rt    Currently in Pain?  No/denies                       [facility] Adult PT Treatment/Exercise - 03/11/19 0001      Ambulation/Gait   Ambulation/Gait  Yes    Assistive device  Rollator  Gait Pattern  Step-to pattern    Ambulation Surface  Level    Gait Comments  40' in 2:45, 61' in 3:10 sec, poor Rt LE advancement, signif slower and with more impaired Rt LE swing compared to last visit      Neuro Re-ed    Neuro Re-ed Details   LE PNF patterns Rt LE for flexion/add, ext/abduction in Lt SL, PT provided faciliation and progressed to light resistance      Lumbar Exercises: Aerobic   Nustep  L1 x 3', PT present to monitor      Knee/Hip Exercises: Aerobic   Nustep  level 1 x 5'      Knee/Hip Exercises: Seated   Long Arc Quad  Strengthening;AROM;Both;1 set;10 reps    Marching  AROM;Both;20 reps    Marching Limitations  fair Rt LE      Knee/Hip Exercises: Supine   Quad Sets  Strengthening;Right;15 reps    Quad Sets  Limitations  with towel roll under knee, hold x 5 sec    Straight Leg Raises  AROM;Both;3 sets;5 reps    Straight Leg Raises Limitations  small range on Rt LE, lifting approx 50% of range from last visits      Knee/Hip Exercises: Sidelying   Hip ABduction  AAROM;Strengthening;Right;10 reps    Hip ABduction Limitations  needed facil at hip flexors via tapping for improved recruitment, needed AA/ROM as compared to A/ROM previous visits      Manual Therapy   Passive ROM  Rt hip extension stretch x 30 sec      Ankle Exercises: Seated   Ankle Circles/Pumps  AROM;20 reps;Right               PT Short Term Goals - 03/09/19 1210      PT SHORT TERM GOAL #1   Title  Pt will be ind in intial HEP    Status  Achieved      PT SHORT TERM GOAL #2   Title  Pt will ambulate at least 60 feet with step to or step through gait pattern with rollator without shuffling Rt LE at least 50% of the time.    Baseline  covers 68' with mostly step/shuffle to pattern, occassional foot clearance before fatigues    Status  Partially Met      PT SHORT TERM GOAL #3   Title  Pt will perform sit to stand with UEs as needed using proper mechanics, symmetry and no LOB without use of momentum x 5 reps.    Status  Achieved        PT Long Term Goals - 03/09/19 1105      PT LONG TERM GOAL #1   Title  Pt will be safe and independent with advanced HEP.    Baseline  trying to do them in his bed    Status  On-going      PT LONG TERM GOAL #2   Title  Pt will achieve community distance ambulation with more erect posture with proper advancement of Rt LE for step to or step through pattern (no shuffling) at least 75% of the time, using rollator or single point cane.    Baseline  household community only for now, step to with shuffle gait Rt LE at least 70% of time with fatigue    Status  On-going      PT LONG TERM GOAL #3   Title  Pt will improve TUG time to </= 40 sec to demo improved  functional mobility and  less fall risk.    Baseline  46 sec 03/09/19    Status  On-going      PT LONG TERM GOAL #4   Title  Pt will improve 5x sit to stand to </= 25 sec to demo improved functional mobility and less fall risk.    Baseline  22 sec    Status  Achieved      PT LONG TERM GOAL #5   Title  Pt will improve LE strength to at least 4+/5 throughout bil LEs to improve safety and functional mobility for daily tasks such as getting in/out of tub.    Baseline  Rt hip flexors continued weakness of 3/5, knee extension 4-/5, Rt hip abduction 4-/5, Rt hip ext 4-/5    Status  On-going            Plan - 03/11/19 1135    Clinical Impression Statement  Pt had episode of legs giving way last night.  He also noted much greater difficulty with his tub transfer, needing multiple attempts.  PT observed less strength in Rt LE with gait, having difficulty even initiating Rt foot shuffle on arrival.  Sit to stand was slow and with great effort today.  He ambulated 38' in 2:45 and a second 91' in 3:10, which is a signif reduction in gait distance and velocity compared to previous visits.  He had reduced ability to perform ther ex today, needing more tactile cueing and AA/ROM vs A/ROM for hip abd and SLR.  Gait did improve to closer to baseline end of session after LE PNF and assisted ther ex.  PT will alert [name] and referring MD with patient's permission.  Pt continues to be at risk for decline and regression without PT and continues to be at high fall risk.  Continue PT with careful monitoring of stability of symptoms/weakness in bil LEs Rt>Lt.    Comorbidities  fall risk, progressive scoliosis    Examination-Activity Limitations  Bathing;Locomotion Level;Transfers;Bed Mobility;Squat;Hygiene/Grooming;Toileting;Stand    Examination-Participation Restrictions  Meal Prep;Cleaning;Driving;Laundry    Rehab Potential  Fair    PT Frequency  2x / week    PT Duration  8 weeks    PT Treatment/Interventions  ADLs/Self Care  Home Management;Electrical Stimulation;Cryotherapy;Moist Heat;DME Instruction;Gait training;Stair training;Functional mobility training;Therapeutic activities;Therapeutic exercise;Balance training;Neuromuscular re-education;Patient/family education;Manual techniques;Passive range of motion;Spinal Manipulations;Joint Manipulations;Energy conservation;Dry needling    PT Next Visit Plan  f/u on Rt LE strength since legs gave way, gait, LE strength, endurance    PT Home Exercise Plan  Access Code: [email or web address]    Consulted and Agree with Plan of Care  Patient    Family Member Consulted  adult daughter - PT called after visit to give update       Patient will benefit from skilled therapeutic intervention in order to improve the following deficits and impairments:     Visit Diagnosis: Muscle weakness (generalized)  Abnormal posture  Other abnormalities of gait and mobility     Problem List Patient Active Problem List   Diagnosis Date Noted  . Unstable angina ([place]) 08/10/2017  . Candida infection 08/30/2015  . Malignant neoplasm of prostate ([place]) 04/28/2015  . BPH (benign prostatic hypertrophy) with urinary obstruction 10/09/2012    Baruch Merl, PT 03/11/19 1:00 PM   [facility] [address] [place], [place], [address] Phone: [phone]   Fax:  [phone]  Name: Seth Mccall MRN: [id number] Date of Birth: 1929-08-23

## 2019-03-16 ENCOUNTER — Encounter: Payer: Self-pay | Admitting: Physical Therapy

## 2019-03-16 ENCOUNTER — Other Ambulatory Visit: Payer: Self-pay

## 2019-03-16 ENCOUNTER — Ambulatory Visit: Payer: Medicare HMO | Admitting: Physical Therapy

## 2019-03-16 DIAGNOSIS — R2689 Other abnormalities of gait and mobility: Secondary | ICD-10-CM

## 2019-03-16 DIAGNOSIS — R293 Abnormal posture: Secondary | ICD-10-CM

## 2019-03-16 DIAGNOSIS — M6281 Muscle weakness (generalized): Secondary | ICD-10-CM | POA: Diagnosis not present

## 2019-03-16 NOTE — Therapy (Signed)
[facility] [address] [place], [place], [address] Phone: [phone]   Fax:  [phone]  Physical Therapy Treatment  Patient Details  Name: Seth Mccall MRN: [id number] Date of Birth: 22-Feb-1930 Referring Provider (PT): Magnus Sinning, MD   Encounter Date: 03/16/2019  PT End of Session - 03/16/19 1114    Visit Number  22    Date for PT Re-Evaluation  03/25/19    Authorization Type  [employer]    PT Start Time  1100    PT Stop Time  1145    PT Time Calculation (min)  45 min    Equipment Utilized During Treatment  Other (comment)   rollator   Activity Tolerance  Patient tolerated treatment well;Patient limited by fatigue;No increased pain    Behavior During Therapy  WFL for tasks assessed/performed       Past Medical History:  Diagnosis Date  . Arthritis   . Complication of anesthesia    limited movement in neck because of fusion  . Gout   . Hypertension   . Prostate cancer [facility])     Past Surgical History:  Procedure Laterality Date  . BACK SURGERY  2005   fusion of C3, C4, and C5  . CATARACT EXTRACTION Bilateral   . CORONARY ATHERECTOMY N/A 08/12/2017   Procedure: CORONARY ATHERECTOMY;  Surgeon: Adrian Prows, MD;  Location: [place] [facility];  Service: Cardiovascular;  Laterality: N/A;  . CORONARY STENT INTERVENTION N/A 08/12/2017   Procedure: CORONARY STENT INTERVENTION;  Surgeon: Adrian Prows, MD;  Location: [place] [facility];  Service: Cardiovascular;  Laterality: N/A;  . GREEN LIGHT LASER TURP (TRANSURETHRAL RESECTION OF PROSTATE N/A 10/10/2012   Procedure: GREEN LIGHT LASER TURP (TRANSURETHRAL RESECTION OF PROSTATE;  Surgeon: Claybon Jabs, MD;  [facility]: [facility];  Service: Urology;  Laterality: N/A;  . LEFT HEART CATH AND CORONARY ANGIOGRAPHY N/A 08/12/2017   Procedure: LEFT HEART CATH AND CORONARY ANGIOGRAPHY;  Surgeon: Adrian Prows, MD;  Location: [place] [facility];  Service:  Cardiovascular;  Laterality: N/A;  . PROSTATE BIOPSY    . TRANSURETHRAL RESECTION OF PROSTATE  10-10-2012    There were no vitals filed for this visit.  Subjective Assessment - 03/16/19 1111    Subjective  Pt feels he has recovered from last week's legs giving out.  He does wonder if something has changed b/c he feels his lifted shoe on Rt is too high now.  Arrives with unlifted Rt shoe on.    Pertinent History  fall risk, worsening scoliosis, prostate cancer, heart surgeries, cervical fusion x 3 levels    Limitations  Standing;Walking    How long can you sit comfortably?  unlimited    How long can you stand comfortably?  30 min    How long can you walk comfortably?  240' but distance varies depending on fatigue, slow, with rollator    Diagnostic tests  MRI: signif degen arthritis L4/5 but no neural compression, xrays 11/2018, progression of scoliosis as compared to 2016, lumbar multilevel DDD, facet arthoropathy    Patient Stated Goals  get stronger, walk greater distances, correct trunk lean to Rt    Currently in Pain?  No/denies         [facility] PT Assessment - 03/16/19 0001      Standardized Balance Assessment   Five times sit to stand comments   1st trial 31 sec, 2nd trial 22 sec      Timed Up and  Go Test   TUG  Normal TUG    Normal TUG (seconds)  56    TUG Comments  improved gait, slow turns, improved sit to stand, use of Rollator, close supervision                   [facility] Adult PT Treatment/Exercise - 03/16/19 0001      Ambulation/Gait   Ambulation/Gait  Yes    Ambulation Distance (Feet)  160 Feet    Assistive device  Rollator    Gait Pattern  Step-to pattern;Step-through pattern    Ambulation Surface  Level    Pre-Gait Activities  PT gave VCs to use more knee flexion and extension on Rt, VC reminders to "get air under Rt foot" to avoid shuffling, close supervision provided    Gait Comments  PT observed with Rt shoe lift and without shoe lift, greater pelvic  alignment with shoe lift, greater ease of Rt foot clearance without shoe lift      Exercises   Exercises  Lumbar;Knee/Hip      Lumbar Exercises: Aerobic   Nustep  L1 x 10', PT present to discuss shoes and level of activity at home      Knee/Hip Exercises: Seated   Long Arc Quad  Strengthening;Both;2 sets;10 reps    Marching  Strengthening;2 sets;20 reps    Sit to Sand  2 sets;5 reps      Knee/Hip Exercises: Supine   Straight Leg Raises  AROM;Both;3 sets;5 reps    Straight Leg Raises Limitations  small range on Rt LE, lifting approx 50% of range from last visits               PT Short Term Goals - 03/09/19 1210      PT SHORT TERM GOAL #1   Title  Pt will be ind in intial HEP    Status  Achieved      PT SHORT TERM GOAL #2   Title  Pt will ambulate at least 60 feet with step to or step through gait pattern with rollator without shuffling Rt LE at least 50% of the time.    Baseline  covers 240' with mostly step/shuffle to pattern, occassional foot clearance before fatigues    Status  Partially Met      PT SHORT TERM GOAL #3   Title  Pt will perform sit to stand with UEs as needed using proper mechanics, symmetry and no LOB without use of momentum x 5 reps.    Status  Achieved        PT Long Term Goals - 03/16/19 1653      PT LONG TERM GOAL #2   Title  Pt will achieve community distance ambulation with more erect posture with proper advancement of Rt LE for step to or step through pattern (no shuffling) at least 75% of the time, using rollator or single point cane.    Baseline  household community only for now, step to with shuffle gait Rt LE at least 70% of time with fatigue      PT LONG TERM GOAL #3   Title  Pt will improve TUG time to </= 40 sec to demo improved functional mobility and less fall risk.    Baseline  46 sec on 10/19, 56 sec 10/26      PT LONG TERM GOAL #4   Title  Pt will improve 5x sit to stand to </= 25 sec to demo improved functional mobility and  less   fall risk.    Baseline  22 sec    Status  Achieved      PT LONG TERM GOAL #5   Title  Pt will improve LE strength to at least 4+/5 throughout bil LEs to improve safety and functional mobility for daily tasks such as getting in/out of tub.    Baseline  Rt hip flexors continued weakness of 3/5, knee extension 4-/5, Rt hip abduction 4-/5, Rt hip ext 4-/5    Status  On-going            Plan - 03/16/19 1640    Clinical Impression Statement  Pt arrived with question about whether to continue using Rt shoe with heel lift as he feels it is too high now that his Rt leg is stronger.  PT observed gait with and without lifted shoe (Pt brought both).  With lifted shoe Pt has greater symmetry of pelvis but with Pt's improved knee extension control on Rt, his Lt heel leaves ground in DLS when he fully engages Rt quad.  He has greater ability to clear the Rt foot during swing with shoe without lift but demos greater pelvic and trunk asymmetry.  PT advised he discuss this with his MD or see about having lifted shoe adjusted to lower height as a compromise for gait ease and pelvic symmetry.  Pt continues to demo ability to perform 5x sit to stand in 22 sec, although first trial was 31 sec today.  TUG was 56 sec placing him at high fall risk, although sit to stand and gait component of this are improved with increased time being taken for the 180 deg turn with rollator which is very slow.  Pt needs intermittent VCs by PT to remember not to shuffle Rt foot.  Gait improves following use of NuStep.  He will continue to benefit from skilled PT to reduce fall risk, improve gait endurance and LE strength along POC with re-eval next week.    Comorbidities  fall risk, progressive scoliosis    Rehab Potential  Fair    PT Frequency  2x / week    PT Duration  8 weeks    PT Treatment/Interventions  ADLs/Self Care Home Management;Electrical Stimulation;Cryotherapy;Moist Heat;DME Instruction;Gait training;Stair  training;Functional mobility training;Therapeutic activities;Therapeutic exercise;Balance training;Neuromuscular re-education;Patient/family education;Manual techniques;Passive range of motion;Spinal Manipulations;Joint Manipulations;Energy conservation;Dry needling    PT Next Visit Plan  continue gait endurance, hip P/ROM, stretching, LE strength, gait training    PT Home Exercise Plan  Access Code: [email or web address]    Consulted and Agree with Plan of Care  Patient       Patient will benefit from skilled therapeutic intervention in order to improve the following deficits and impairments:     Visit Diagnosis: Muscle weakness (generalized)  Abnormal posture  Other abnormalities of gait and mobility     Problem List Patient Active Problem List   Diagnosis Date Noted  . Unstable angina ([place]) 08/10/2017  . Candida infection 08/30/2015  . Malignant neoplasm of prostate ([place]) 04/28/2015  . BPH (benign prostatic hypertrophy) with urinary obstruction 10/09/2012    Baruch Merl, PT 03/16/19 5:00 PM   [facility] [address], [place], [place], [address] Phone: [phone]   Fax:  [phone]  Name: Seth Mccall MRN: [id number] Date of Birth: 1929-10-11

## 2019-03-17 ENCOUNTER — Ambulatory Visit: Payer: Medicare HMO | Admitting: Podiatry

## 2019-03-17 ENCOUNTER — Other Ambulatory Visit: Payer: Self-pay

## 2019-03-17 ENCOUNTER — Encounter: Payer: Self-pay | Admitting: Podiatry

## 2019-03-17 VITALS — BP 138/75 | HR 54

## 2019-03-17 DIAGNOSIS — B351 Tinea unguium: Secondary | ICD-10-CM | POA: Diagnosis not present

## 2019-03-17 DIAGNOSIS — M79675 Pain in left toe(s): Secondary | ICD-10-CM

## 2019-03-17 DIAGNOSIS — L84 Corns and callosities: Secondary | ICD-10-CM | POA: Diagnosis not present

## 2019-03-17 DIAGNOSIS — M79671 Pain in right foot: Secondary | ICD-10-CM | POA: Diagnosis not present

## 2019-03-17 DIAGNOSIS — M79674 Pain in right toe(s): Secondary | ICD-10-CM | POA: Diagnosis not present

## 2019-03-17 NOTE — Patient Instructions (Signed)

## 2019-03-18 ENCOUNTER — Encounter: Payer: Self-pay | Admitting: Physical Therapy

## 2019-03-18 ENCOUNTER — Ambulatory Visit: Payer: Medicare HMO | Admitting: Physical Therapy

## 2019-03-18 DIAGNOSIS — M6281 Muscle weakness (generalized): Secondary | ICD-10-CM

## 2019-03-18 DIAGNOSIS — R293 Abnormal posture: Secondary | ICD-10-CM

## 2019-03-18 DIAGNOSIS — R2689 Other abnormalities of gait and mobility: Secondary | ICD-10-CM

## 2019-03-18 NOTE — Patient Instructions (Signed)
1) walk more often throughout day for short distances, focusing on standing tall and picking right foot up as you walk.  This will help your hip not get so tight and break up sitting which causes stiffness.  2) practice standing up and sitting down from a chair.  Do 5 in a row, rest and repeat again for 5 more.  3) seated marching (go slow) alternating right and left for 20 marches  4) seated knee extension (straighten knee, lifting foot).  Do 10 on right, then 10 on left.  5) repeat number 3.    6) repeat number 4  7) Do 5 more sit to stands from a chair if you still have energy to do so.

## 2019-03-18 NOTE — Therapy (Addendum)
[facility] [address], [place], [place], [address] Phone: [phone]   Fax:  [phone]  Physical Therapy Treatment  Patient Details  Name: Seth Mccall MRN: [id number] Date of Birth: 03/21/1930 Referring Provider (PT): Magnus Sinning, MD   Encounter Date: 03/18/2019  PT End of Session - 03/18/19 1108    Visit Number  23    Date for PT Re-Evaluation  03/25/19    Authorization Type  [employer] Medicare    PT Start Time  1106    PT Stop Time  1145    PT Time Calculation (min)  39 min    Equipment Utilized During Treatment  Other (comment)   rollator   Activity Tolerance  Patient tolerated treatment well;Patient limited by fatigue;No increased pain    Behavior During Therapy  WFL for tasks assessed/performed       Past Medical History:  Diagnosis Date  . Arthritis   . Complication of anesthesia    limited movement in neck because of fusion  . Gout   . Hypertension   . Prostate cancer [facility])     Past Surgical History:  Procedure Laterality Date  . BACK SURGERY  2005   fusion of C3, C4, and C5  . CATARACT EXTRACTION Bilateral   . CORONARY ATHERECTOMY N/A 08/12/2017   Procedure: CORONARY ATHERECTOMY;  Surgeon: Adrian Prows, MD;  Location: [place] [facility];  Service: Cardiovascular;  Laterality: N/A;  . CORONARY STENT INTERVENTION N/A 08/12/2017   Procedure: CORONARY STENT INTERVENTION;  Surgeon: Adrian Prows, MD;  Location: [place] [facility];  Service: Cardiovascular;  Laterality: N/A;  . GREEN LIGHT LASER TURP (TRANSURETHRAL RESECTION OF PROSTATE N/A 10/10/2012   Procedure: GREEN LIGHT LASER TURP (TRANSURETHRAL RESECTION OF PROSTATE;  Surgeon: Claybon Jabs, MD;  [facility];  Service: Urology;  Laterality: N/A;  . LEFT HEART CATH AND CORONARY ANGIOGRAPHY N/A 08/12/2017   Procedure: LEFT HEART CATH AND CORONARY ANGIOGRAPHY;  Surgeon: Adrian Prows, MD;  Location: [place] [facility];  Service:  Cardiovascular;  Laterality: N/A;  . PROSTATE BIOPSY    . TRANSURETHRAL RESECTION OF PROSTATE  10-10-2012    There were no vitals filed for this visit.  Subjective Assessment - 03/18/19 1109    Subjective  Pt tried to contact technician about lowering Rt raised shoe but couldn't get in until even of Nov.  Arrives with level shoes on and feels he walks better with them.    Pertinent History  fall risk, worsening scoliosis, prostate cancer, heart surgeries, cervical fusion x 3 levels    Limitations  Standing;Walking    How long can you sit comfortably?  unlimited    How long can you stand comfortably?  30 min    How long can you walk comfortably?  240' but distance varies depending on fatigue, slow, with rollator    Diagnostic tests  MRI: signif degen arthritis L4/5 but no neural compression, xrays 11/2018, progression of scoliosis as compared to 2016, lumbar multilevel DDD, facet arthoropathy    Patient Stated Goals  get stronger, walk greater distances, correct trunk lean to Rt    Currently in Pain?  No/denies                       [facility] Adult PT Treatment/Exercise - 03/18/19 0001      Ambulation/Gait   Ambulation/Gait  Yes    Ambulation Distance (Feet)  240 Feet   broken up 80' beginning  of session, then 160' after NuStep   Assistive device  Rollator    Gait Pattern  Step-to pattern;Step-through pattern   varied step through success   Ambulation Surface  Level    Pre-Gait Activities  PT gave cues for Pt to stand taller within walker, heel toe pattern on Rt, "get air under foot" to prevent shuffle    Gait Comments  pelvic obliquity more pronounced without shoe lift, better foot clearance without shoe lift      Knee/Hip Exercises: Aerobic   Nustep  L1 x 10 min seat 6, PT present to discuss progress      Knee/Hip Exercises: Seated   Long Arc Quad  Strengthening;Both;2 sets;10 reps    Marching  Strengthening;20 reps    Sit to [employer]  3 sets;5 reps;with UE support   PT  cued hip hinge and press through heels for glute activ.              PT Short Term Goals - 03/09/19 1210      PT SHORT TERM GOAL #1   Title  Pt will be ind in intial HEP    Status  Achieved      PT SHORT TERM GOAL #2   Title  Pt will ambulate at least 60 feet with step to or step through gait pattern with rollator without shuffling Rt LE at least 50% of the time.    Baseline  covers 70' with mostly step/shuffle to pattern, occassional foot clearance before fatigues    Status  Partially Met      PT SHORT TERM GOAL #3   Title  Pt will perform sit to stand with UEs as needed using proper mechanics, symmetry and no LOB without use of momentum x 5 reps.    Status  Achieved        PT Long Term Goals - 03/16/19 1653      PT LONG TERM GOAL #2   Title  Pt will achieve community distance ambulation with more erect posture with proper advancement of Rt LE for step to or step through pattern (no shuffling) at least 75% of the time, using rollator or single point cane.    Baseline  household community only for now, step to with shuffle gait Rt LE at least 70% of time with fatigue      PT LONG TERM GOAL #3   Title  Pt will improve TUG time to </= 40 sec to demo improved functional mobility and less fall risk.    Baseline  46 sec on 10/19, 56 sec 10/26      PT LONG TERM GOAL #4   Title  Pt will improve 5x sit to stand to </= 25 sec to demo improved functional mobility and less fall risk.    Baseline  22 sec    Status  Achieved      PT LONG TERM GOAL #5   Title  Pt will improve LE strength to at least 4+/5 throughout bil LEs to improve safety and functional mobility for daily tasks such as getting in/out of tub.    Baseline  Rt hip flexors continued weakness of 3/5, knee extension 4-/5, Rt hip abduction 4-/5, Rt hip ext 4-/5    Status  On-going            Plan - 03/18/19 1250    Clinical Impression Statement  PT had patient demo more independence by having him verbalize  what he needs to think about for improved  gait.  He starts slow and stiff but Rt LE advancement with gait improves with use and mindfulness by Pt.  PT urged Pt compliance at home to continue short distance household gait more frequently thorughout day to maintain Rt hip motion gained in PT. PT also simplified HEP to 3 seated chair exercises: LAQ, marching and sit to stands so program is easier to follow and may improve compliance as he transitions towards discharge.  PT spoke to Pt and [name] and all have agreed next visit will be d/c to HEP as Pt is transitioning into assisted living in several weeks.  Re-eval and d/c next visit.    Comorbidities  fall risk, progressive scoliosis    Examination-Activity Limitations  Bathing;Locomotion Level;Transfers;Bed Mobility;Squat;Hygiene/Grooming;Toileting;Stand    Examination-Participation Restrictions  Meal Prep;Cleaning;Driving;Laundry    Rehab Potential  Fair    PT Frequency  2x / week    PT Duration  8 weeks    PT Treatment/Interventions  ADLs/Self Care Home Management;Electrical Stimulation;Cryotherapy;Moist Heat;DME Instruction;Gait training;Stair training;Functional mobility training;Therapeutic activities;Therapeutic exercise;Balance training;Neuromuscular re-education;Patient/family education;Manual techniques;Passive range of motion;Spinal Manipulations;Joint Manipulations;Energy conservation;Dry needling    PT Next Visit Plan  re-eval next visit, d/c likely    PT Home Exercise Plan  Access Code: [email or web address]    Consulted and Agree with Plan of Care  Patient    Family Member Consulted  adult daughter - PT called after visit to give update       Patient will benefit from skilled therapeutic intervention in order to improve the following deficits and impairments:  Abnormal gait, Decreased coordination, Difficulty walking, Decreased range of motion, Decreased endurance, Decreased activity tolerance, Decreased balance, Hypomobility, Impaired  flexibility, Improper body mechanics, Decreased mobility, Decreased strength, Postural dysfunction  Visit Diagnosis: Abnormal posture  Muscle weakness (generalized)  Other abnormalities of gait and mobility     Problem List Patient Active Problem List   Diagnosis Date Noted  . Unstable angina ([place]) 08/10/2017  . Candida infection 08/30/2015  . Malignant neoplasm of prostate ([place]) 04/28/2015  . BPH (benign prostatic hypertrophy) with urinary obstruction 10/09/2012   Johanna Beuhring, PT 03/18/19 12:55 PM  PHYSICAL THERAPY DISCHARGE SUMMARY  Visits from Start of Care: 23  Current functional level related to goals / functional outcomes: Pt met 5x sit to stand goal demo'ing much improved transfers.  Gait remains slow but with improved ability to advance Rt LE with less occurrence of shuffling.  TUG time did not improve likely due to length of time required to turn 180 deg with rollator.  Pt reports feeling stronger and more confident with household mobility and Rt LE control.   Remaining deficits: See above   Education / Equipment: HEP, Pt transitioning to assisted living this month. Plan: Patient agrees to discharge.  Patient goals were partially met. Patient is being discharged due to being pleased with the current functional level.  ?????         Baruch Merl, PT 03/25/19 12:08 PM   [facility] [address] [place], [place], [address] Phone: [phone]   Fax:  [phone]  Name: Susumu JEFFRE ENRIQUES MRN: [id number] Date of Birth: 06/05/1929

## 2019-03-19 NOTE — Progress Notes (Signed)
Subjective: Blaise MERIT HERPEL presents today accompanied by his daughter,  with cc of painful, discolored, thick toenails which interfere with daily activities.  Pain is aggravated when wearing enclosed shoe gear.   Patient also c/o painful callus on bottom of right foot.   He has h/o prostate cancer treated with radiation.  Past Medical History:  Diagnosis Date  . Arthritis   . Complication of anesthesia    limited movement in neck because of fusion  . Gout   . Hypertension   . Prostate cancer [facility])      Patient Active Problem List   Diagnosis Date Noted  . Unstable angina ([place]) 08/10/2017  . Candida infection 08/30/2015  . Malignant neoplasm of prostate ([place]) 04/28/2015  . BPH (benign prostatic hypertrophy) with urinary obstruction 10/09/2012     Past Surgical History:  Procedure Laterality Date  . BACK SURGERY  2005   fusion of C3, C4, and C5  . CATARACT EXTRACTION Bilateral   . CORONARY ATHERECTOMY N/A 08/12/2017   Procedure: CORONARY ATHERECTOMY;  Surgeon: Adrian Prows, MD;  Location: [place] [facility];  Service: Cardiovascular;  Laterality: N/A;  . CORONARY STENT INTERVENTION N/A 08/12/2017   Procedure: CORONARY STENT INTERVENTION;  Surgeon: Adrian Prows, MD;  Location: [place] [facility];  Service: Cardiovascular;  Laterality: N/A;  . GREEN LIGHT LASER TURP (TRANSURETHRAL RESECTION OF PROSTATE N/A 10/10/2012   Procedure: GREEN LIGHT LASER TURP (TRANSURETHRAL RESECTION OF PROSTATE;  Surgeon: Claybon Jabs, MD;  Location: [facility];  Service: Urology;  Laterality: N/A;  . LEFT HEART CATH AND CORONARY ANGIOGRAPHY N/A 08/12/2017   Procedure: LEFT HEART CATH AND CORONARY ANGIOGRAPHY;  Surgeon: Adrian Prows, MD;  Location: [place] [facility];  Service: Cardiovascular;  Laterality: N/A;  . PROSTATE BIOPSY    . TRANSURETHRAL RESECTION OF PROSTATE  10-10-2012     Current Outpatient Medications on File Prior to Visit  Medication Sig Dispense Refill  . aspirin EC 81 MG EC tablet Take  1 tablet (81 mg total) by mouth daily.    Marland Kitchen atorvastatin (LIPITOR) 10 MG tablet Take 1 tablet (10 mg total) by mouth daily at 6 PM. 90 tablet 1  . colchicine 0.6 MG tablet Take 0.6 mg by mouth 2 (two) times daily. Reported on 06/02/2015    . metoprolol succinate (TOPROL-XL) 25 MG 24 hr tablet Take 0.5 tablets (12.5 mg total) by mouth daily. 90 tablet 1  . mirabegron ER (MYRBETRIQ) 25 MG TB24 tablet Take 25 mg by mouth daily.    . nitroGLYCERIN (NITROSTAT) 0.4 MG SL tablet Place 1 tablet (0.4 mg total) under the tongue every 5 (five) minutes x 3 doses as needed for chest pain. 25 tablet 3  . Vitamin D, Cholecalciferol, 50 MCG (2000 UT) CAPS Take by mouth daily.     No current facility-administered medications on file prior to visit.      No Known Allergies   Social History   Occupational History  . Not on file  Tobacco Use  . Smoking status: Former Smoker    Packs/day: 0.50    Years: 4.00    Pack years: 2.00    Types: Cigarettes    Quit date: 05/21/1968    Years since quitting: 50.8  . Smokeless tobacco: Never Used  Substance and Sexual Activity  . Alcohol use: Yes    Comment: occasional  . Drug use: No  . Sexual activity: Not on file     History reviewed. No pertinent family history.  There is no immunization history on file for this patient.   Review of systems: Positive Findings in bold print.  Constitutional:  chills, fatigue, fever, sweats, weight change Communication: Optometrist, sign Ecologist, hand writing, iPad/Android device Head: headaches, head injury Eyes: changes in vision, eye pain, glaucoma, cataracts, macular degeneration, diplopia, glare,  light sensitivity, eyeglasses or contacts, blindness Ears nose mouth throat: hearing impaired, hearing aids,  ringing in ears, deaf, sign language,  vertigo,   nosebleeds,  rhinitis,  cold sores, snoring, swollen glands Cardiovascular: HTN, edema, arrhythmia, pacemaker in place, defibrillator in place, chest  pain/tightness, chronic anticoagulation, blood clot, heart failure, MI Peripheral Vascular: leg cramps, varicose veins, blood clots, lymphedema, varicosities Respiratory:  difficulty breathing, denies congestion, SOB, wheezing, cough, emphysema Gastrointestinal: change in appetite or weight, abdominal pain, constipation, diarrhea, nausea, vomiting, vomiting blood, change in bowel habits, abdominal pain, jaundice, rectal bleeding, hemorrhoids, GERD Genitourinary:  nocturia,  pain on urination, polyuria,  blood in urine, Foley catheter, urinary urgency, ESRD on hemodialysis Musculoskeletal: amputation, cramping, stiff joints, painful joints, decreased joint motion, fractures, OA, gout, hemiplegia, paraplegia, uses cane, wheelchair bound, uses walker, uses rollator Skin: +changes in toenails, color change, dryness, itching, mole changes,  rash, wound(s) Neurological: headaches, numbness in feet, paresthesias in feet, burning in feet, fainting,  seizures, change in speech. denies headaches, memory problems/poor historian, cerebral palsy, weakness, paralysis, CVA, TIA Endocrine: diabetes, hypothyroidism, hyperthyroidism,  goiter, dry mouth, flushing, heat intolerance,  cold intolerance,  excessive thirst, denies polyuria,  nocturia Hematological:  easy bleeding, excessive bleeding, easy bruising, enlarged lymph nodes, on long term blood thinner, history of past transusions Allergy/immunological:  hives, eczema, frequent infections, multiple drug allergies, seasonal allergies, transplant recipient, multiple food allergies Psychiatric:  anxiety, depression, mood disorder, suicidal ideations, hallucinations, insomnia  Objective: Vitals:   03/17/19 1105  BP: 138/75  Pulse: (!) 54    Vascular Examination: Capillary refill time immediat b/l.   Dorsalis pedis pulses palpable b/l.   Posterior tibial pulses palpable b/l.   No digital hair x 10 digits.  Skin temperature gradient WNL b/l   Venous  stasis skin changes noted b/l LE.  Dermatological Examination: Skin with normal turgor, texture and tone b/l.  Toenails 1-5 b/l discolored, thick, dystrophic with subungual debris and pain with palpation to nailbeds due to thickness of nails.  Hyperkeratotic lesion submet head 1 right with tenderness to palpation. No edema, no erythema, no drainage, no flocculence.  Musculoskeletal: Muscle strength 5/5 to all LE muscle groups b/l.  Neurological: Sensation intact with 10 gram monofilament.  Vibratory sensation intact.  Assessment: 1. Painful onychomycosis toenails 1-5 b/l  2. Callus submet head 1 right foot 3. Pain in right foot  Plan: 1. Discussed onychomycosis and treatment options.  Literature dispensed on today. 2. Toenails 1-5 b/l were debrided in length and girth without iatrogenic bleeding. Calluses pared submetatarsal head 1 right foot utilizing sterile scalpel blade without incident. Applied felt callus aperture pad. Dispensed extra pads. Discussed shoe gear change and shoe recommendations. 3. Patient to continue soft, supportive shoe gear daily. 4. Patient to report any pedal injuries to medical professional immediately. 5. Follow up per daughter. Mr. Mazzarese will be moving to [employer] [facility] and most likely will receive Podiatry services there at the facility. 6. Patient/POA to call should there be a concern in the interim.

## 2019-03-25 ENCOUNTER — Ambulatory Visit: Payer: Medicare HMO | Admitting: Physical Therapy

## 2019-04-01 ENCOUNTER — Encounter: Payer: Medicare HMO | Admitting: Physical Therapy

## 2019-04-06 ENCOUNTER — Encounter: Payer: Medicare HMO | Admitting: Physical Therapy

## 2019-04-07 NOTE — Progress Notes (Signed)
Seth Mccall - 85 y.o. male MRN [id number]  Date of birth: 1929/06/05  Office Visit Note: Visit Date: 03/04/2019 PCP: [name], MD Referred by: [name], MD  Subjective: Chief Complaint  Patient presents with   Right Leg - Weakness   HPI: Seth Mccall is a 85 y.o. male who comes in today For reevaluation management of mainly weakness of the right hip and thigh and gait abnormality in the setting of progressive degenerative scoliosis and leg length discrepancy as well as history of pretty severe neck surgery with cervical stenosis.  Patient is here with his son who provides some of the history.  Patient is typically here with his daughter who is a Building services engineer.  He has been undergoing physical therapy and really getting some decent results with mobility from that and the physical therapist had wondered about continued potential weakness and other issues with some neck pain.  We had ordered MRI of the cervical spine which was denied by his insurance mainly because we had not seen him in follow-up for reasons behind the cervical MRI.  I had discussed with his daughter the reasons for that.  In researching his case as to why he had onset of scoliosis later in life and continued to have this weakness without really great findings in the lumbar spine I determine that he had MRI of the cervical spine in 2014 I believe through Dr. Newman Pies.  Dr. Arnoldo Morale performed his cervical surgery I believe.  Surgery was performed many years ago.  Repeat MRI which is reviewed today with the patient and his son shows pretty significant stenosis still at C3-4 with some cord signal changes.  This was a pretty severe issue and at the time the patient also began having issues with his prostate and had been diagnosed with prostate cancer.  I think his neck seem to take a backseat to the more pressing problem of the prostate cancer.  Interestingly over the time that I have seen him on a few  occasions never expressed any issue with neck pain or arm pain or paresthesia.  Clearly cervical spine stenosis with cord signal changes could give someone leg weakness and this could also be over time may be a reason behind the scoliosis change.  We had a long talk today with the patient and his son and spoke for over 25 minutes on his condition.  They speak that they are looking at maybe getting the patient and his wife into assisted living type situation so he has help with her.  He has gotten stronger with physical therapy but he still not quite where he like to be but doing okay.  He really does not have much in the way of pain.  He again is not really having any symptoms into the neck or shoulders or arms at this point.  Review of Systems  Constitutional: Negative for chills, fever, malaise/fatigue and weight loss.  HENT: Negative for hearing loss and sinus pain.   Eyes: Negative for blurred vision, double vision and photophobia.  Respiratory: Negative for cough and shortness of breath.   Cardiovascular: Negative for chest pain, palpitations and leg swelling.  Gastrointestinal: Negative for abdominal pain, nausea and vomiting.  Genitourinary: Negative for flank pain.  Musculoskeletal: Positive for back pain. Negative for myalgias.  Skin: Negative for itching and rash.  Neurological: Positive for focal weakness and weakness. Negative for tremors.  Endo/Heme/Allergies: Negative.   Psychiatric/Behavioral: Negative for depression.  All other systems reviewed and  are negative.  Otherwise per HPI.  Assessment & Plan: Visit Diagnoses:  1. Weakness   2. Scoliosis, unspecified scoliosis type, unspecified spinal region   3. Bilateral low back pain, unspecified chronicity, unspecified whether sciatica present   4. Post laminectomy syndrome   5. Spinal stenosis of cervical region     Plan: Findings:  Worsening mobility issues and gait problems with leg length discrepancy on the right with  weakness of the right thigh and scoliosis.  Lumbar spine MRI really not very impressive in terms of any stenosis or bad issues.  Cervical spine however has been a significant issue with him more than I ever thought when I first saw him.  He is never had any cervical spine complaints since have known him and then doing some research she had a pretty significant cervical fusion and he had MRI from 2014 showing pretty significant central stenosis still remaining with cord signal changes.  This could be definitely the source of his leg weakness.  Nonetheless at his age he is not really going to probably undergo surgery of the cervical spine.  We talked about the risk of having stenosis with falls.  He is undergoing physical therapy and continues with that I think that is great and he is done pretty well getting stronger.  I do agree with him that potentially getting him and his wife into assisted living or other care type facility is probably a good idea at this point.  This was communicated with the patient and he agrees with this at this point.  We will see him back as needed.    Meds & Orders: No orders of the defined types were placed in this encounter.  No orders of the defined types were placed in this encounter.   Follow-up: Return if symptoms worsen or fail to improve.   Procedures: No procedures performed  No notes on file   Clinical History: MRI CERVICAL SPINE WITHOUT CONTRAST  Technique:  Multiplanar and multiecho pulse sequences of the cervical spine, to include the craniocervical junction and cervicothoracic junction, were obtained according to standard protocol without intravenous contrast.  Comparison:   05/10/2009.  Findings: Prior instrumented cervical fusion C3-C5.  Mild anterolisthesis C5-6 and C6-7.  Moderate stenosis remains at C3-4, described below.  Abnormal cord signal centrally representing gliosis most notable opposite C4 and C5.  Craniocervical  junction unremarkable. No focal areas of marrow replacement.  The individual disc spaces were examined as follows:  C2-3:  Mild bulge.  C3-4:  Status post ACDF.  Broad-based central and rightward osteophytic spurring  continues to project into the spinal canal resulting in significant cord compression.  Caudal to this there is cord atrophy with gliosis. Suspect right C4 nerve root encroachment.  C4-5:  Moderate spinal stenosis secondary to central osteophyte formation. Abnormal cord signal representing gliosis.  Bilateral neural foraminal narrowing due to uncinate spurring is greater on the right.  C5-6:  Advanced facet arthropathy.  Suspect previous remote fusion which is non instrumented.  Mild spinal stenosis at this level with slight effacement anterior subarachnoid space but no neural compression.  C6-7:  Advanced facet arthropathy.  Central and rightward protrusion/osteophytic spurring right greater than left foraminal narrowing likely affecting the C7 nerve roots.  C7-T1:  Mild bulge.  Mild facet arthropathy.  No neural compression.  Compared with priors, a similar appearance is noted.  IMPRESSION: Chronic changes as described status post C3-C5 instrumented fusion, functionally extending to the C5-6 disc space. Continued spinal stenosis with  cord compression at C3-4 greater than C4-5 is associated with cord atrophy and gliosis.  Symptomatic right-sided neural compression likely exists/persists at C3-4, C4-5, and C6-7; see discussion above.  Similar appearance to priors.   Original Report Authenticated By: Rolla Flatten, M.D.   He reports that he quit smoking about 50 years ago. His smoking use included cigarettes. He has a 2.00 pack-year smoking history. He has never used smokeless tobacco. No results for input(s): HGBA1C, LABURIC in the last 8760 hours.  Objective:  VS:  HT:5\' 3"  (160 cm)    WT:144 lb (65.3 kg)   BMI:25.51     BP:119/68   HR:(!) 57bpm    TEMP: ( )   RESP:  Physical Exam Vitals signs and nursing note reviewed.  Constitutional:      General: He is not in acute distress.    Appearance: Normal appearance. He is well-developed.  HENT:     Head: Normocephalic and atraumatic.     Nose: Nose normal.     Mouth/Throat:     Mouth: Mucous membranes are moist.     Pharynx: Oropharynx is clear.  Eyes:     Conjunctiva/sclera: Conjunctivae normal.     Pupils: Pupils are equal, round, and reactive to light.  Neck:     Musculoskeletal: Normal range of motion and neck supple. No neck rigidity.     Trachea: No tracheal deviation.  Cardiovascular:     Rate and Rhythm: Normal rate and regular rhythm.     Pulses: Normal pulses.     Heart sounds: Normal heart sounds.  Pulmonary:     Effort: Pulmonary effort is normal. No respiratory distress.     Breath sounds: Normal breath sounds.  Abdominal:     General: There is no distension.     Palpations: Abdomen is soft.     Tenderness: There is no guarding or rebound.  Musculoskeletal:        General: No deformity.     Right lower leg: No edema.     Left lower leg: No edema.     Comments: Patient sitting in wheelchair today but can stand.  On standing does have leg length discrepancy.  He has obvious scoliosis centered at about the L2 region.  He has no real pain to palpation along the skeletal structures.  He has no hip pain with rotation.  He does have good distal strength.  Measuring the strength of his right leg with hip flexion he does resist pretty well with resisted movement.  He has a little bit difficulty and slowness with trying to fire the hip flexors and to raise the leg.  Skin:    General: Skin is warm and dry.     Findings: No erythema or rash.  Neurological:     General: No focal deficit present.     Mental Status: He is alert and oriented to person, place, and time.     Sensory: No sensory deficit.     Motor: Weakness present. No abnormal muscle tone.     Coordination:  Coordination normal.     Gait: Gait normal.  Psychiatric:        Mood and Affect: Mood normal.        Behavior: Behavior normal.        Thought Content: Thought content normal.     Ortho Exam Imaging: No results found.  Past Medical/Family/Surgical/Social History: Medications & Allergies reviewed per EMR, new medications updated. Patient Active Problem List   Diagnosis Date Noted  Unstable angina ([place]) 08/10/2017   Candida infection 08/30/2015   Malignant neoplasm of prostate ([place]) 04/28/2015   BPH (benign prostatic hypertrophy) with urinary obstruction 10/09/2012   Past Medical History:  Diagnosis Date   Arthritis    Complication of anesthesia    limited movement in neck because of fusion   Gout    Hypertension    Prostate cancer ([place])    History reviewed. No pertinent family history. Past Surgical History:  Procedure Laterality Date   BACK SURGERY  2005   fusion of C3, C4, and C5   CATARACT EXTRACTION Bilateral    CORONARY ATHERECTOMY N/A 08/12/2017   Procedure: CORONARY ATHERECTOMY;  Surgeon: Adrian Prows, MD;  Location: [place] [facility];  Service: Cardiovascular;  Laterality: N/A;   CORONARY STENT INTERVENTION N/A 08/12/2017   Procedure: CORONARY STENT INTERVENTION;  Surgeon: Adrian Prows, MD;  Location: [place] [facility];  Service: Cardiovascular;  Laterality: N/A;   GREEN LIGHT LASER TURP (TRANSURETHRAL RESECTION OF PROSTATE N/A 10/10/2012   Procedure: GREEN LIGHT LASER TURP (TRANSURETHRAL RESECTION OF PROSTATE;  Surgeon: Claybon Jabs, MD;  Location: [facility];  Service: Urology;  Laterality: N/A;   LEFT HEART CATH AND CORONARY ANGIOGRAPHY N/A 08/12/2017   Procedure: LEFT HEART CATH AND CORONARY ANGIOGRAPHY;  Surgeon: Adrian Prows, MD;  Location: [place] [facility];  Service: Cardiovascular;  Laterality: N/A;   PROSTATE BIOPSY     TRANSURETHRAL RESECTION OF PROSTATE  10-10-2012   Social History   Occupational History   Not on file  Tobacco Use    Smoking status: Former Smoker    Packs/day: 0.50    Years: 4.00    Pack years: 2.00    Types: Cigarettes    Quit date: 05/21/1968    Years since quitting: 50.9   Smokeless tobacco: Never Used  Substance and Sexual Activity   Alcohol use: Yes    Comment: occasional   Drug use: No   Sexual activity: Not on file

## 2019-04-08 ENCOUNTER — Encounter: Payer: Medicare HMO | Admitting: Physical Therapy

## 2019-04-13 ENCOUNTER — Encounter: Payer: Medicare HMO | Admitting: Physical Therapy

## 2019-04-20 ENCOUNTER — Encounter: Payer: Medicare HMO | Admitting: Physical Therapy

## 2019-04-22 ENCOUNTER — Encounter: Payer: Medicare HMO | Admitting: Physical Therapy

## 2019-04-27 ENCOUNTER — Encounter: Payer: Medicare HMO | Admitting: Physical Therapy

## 2019-04-28 ENCOUNTER — Other Ambulatory Visit: Payer: Self-pay | Admitting: Internal Medicine

## 2019-04-28 DIAGNOSIS — R4189 Other symptoms and signs involving cognitive functions and awareness: Secondary | ICD-10-CM

## 2019-04-29 ENCOUNTER — Encounter: Payer: Medicare HMO | Admitting: Physical Therapy

## 2019-05-04 ENCOUNTER — Encounter: Payer: Medicare HMO | Admitting: Physical Therapy

## 2019-05-04 ENCOUNTER — Other Ambulatory Visit: Payer: Self-pay | Admitting: Internal Medicine

## 2019-05-05 ENCOUNTER — Other Ambulatory Visit: Payer: Self-pay | Admitting: Internal Medicine

## 2019-05-06 ENCOUNTER — Encounter: Payer: Medicare HMO | Admitting: Physical Therapy

## 2019-05-06 ENCOUNTER — Other Ambulatory Visit: Payer: Self-pay | Admitting: Internal Medicine

## 2019-05-06 ENCOUNTER — Other Ambulatory Visit: Payer: Medicare HMO

## 2019-05-06 ENCOUNTER — Ambulatory Visit
Admission: RE | Admit: 2019-05-06 | Discharge: 2019-05-06 | Disposition: A | Discharge status: Home / Self Care | Payer: Medicare HMO | Source: Ambulatory Visit | Attending: Internal Medicine | Admitting: Internal Medicine

## 2019-05-06 DIAGNOSIS — R4189 Other symptoms and signs involving cognitive functions and awareness: Secondary | ICD-10-CM

## 2019-05-11 ENCOUNTER — Encounter: Payer: Medicare HMO | Admitting: Physical Therapy

## 2019-05-13 ENCOUNTER — Encounter: Payer: Medicare HMO | Admitting: Physical Therapy

## 2019-07-10 ENCOUNTER — Encounter (HOSPITAL_BASED_OUTPATIENT_CLINIC_OR_DEPARTMENT_OTHER): Payer: Medicare HMO | Admitting: Internal Medicine

## 2019-07-12 ENCOUNTER — Observation Stay (HOSPITAL_COMMUNITY): Payer: Medicare HMO

## 2019-07-12 ENCOUNTER — Other Ambulatory Visit: Payer: Self-pay

## 2019-07-12 ENCOUNTER — Emergency Department (HOSPITAL_COMMUNITY): Payer: Medicare HMO

## 2019-07-12 ENCOUNTER — Encounter (HOSPITAL_COMMUNITY): Payer: Self-pay

## 2019-07-12 ENCOUNTER — Inpatient Hospital Stay (HOSPITAL_COMMUNITY)
Admission: EM | Admit: 2019-07-12 | Discharge: 2019-07-19 | DRG: 177 | Disposition: A | Discharge status: Skilled Nursing Facility | Payer: Medicare HMO | Attending: Internal Medicine | Admitting: Internal Medicine

## 2019-07-12 DIAGNOSIS — R1312 Dysphagia, oropharyngeal phase: Secondary | ICD-10-CM | POA: Diagnosis present

## 2019-07-12 DIAGNOSIS — J690 Pneumonitis due to inhalation of food and vomit: Principal | ICD-10-CM | POA: Diagnosis present

## 2019-07-12 DIAGNOSIS — T68XXXA Hypothermia, initial encounter: Secondary | ICD-10-CM

## 2019-07-12 DIAGNOSIS — I878 Other specified disorders of veins: Secondary | ICD-10-CM | POA: Diagnosis present

## 2019-07-12 DIAGNOSIS — Z9079 Acquired absence of other genital organ(s): Secondary | ICD-10-CM

## 2019-07-12 DIAGNOSIS — E46 Unspecified protein-calorie malnutrition: Secondary | ICD-10-CM | POA: Diagnosis present

## 2019-07-12 DIAGNOSIS — Z981 Arthrodesis status: Secondary | ICD-10-CM

## 2019-07-12 DIAGNOSIS — R7989 Other specified abnormal findings of blood chemistry: Secondary | ICD-10-CM | POA: Diagnosis present

## 2019-07-12 DIAGNOSIS — Z8546 Personal history of malignant neoplasm of prostate: Secondary | ICD-10-CM

## 2019-07-12 DIAGNOSIS — E860 Dehydration: Secondary | ICD-10-CM | POA: Diagnosis present

## 2019-07-12 DIAGNOSIS — M1990 Unspecified osteoarthritis, unspecified site: Secondary | ICD-10-CM | POA: Diagnosis present

## 2019-07-12 DIAGNOSIS — E785 Hyperlipidemia, unspecified: Secondary | ICD-10-CM | POA: Diagnosis present

## 2019-07-12 DIAGNOSIS — F4321 Adjustment disorder with depressed mood: Secondary | ICD-10-CM | POA: Diagnosis present

## 2019-07-12 DIAGNOSIS — Z87891 Personal history of nicotine dependence: Secondary | ICD-10-CM

## 2019-07-12 DIAGNOSIS — Z888 Allergy status to other drugs, medicaments and biological substances status: Secondary | ICD-10-CM

## 2019-07-12 DIAGNOSIS — G9341 Metabolic encephalopathy: Secondary | ICD-10-CM | POA: Diagnosis not present

## 2019-07-12 DIAGNOSIS — G9340 Encephalopathy, unspecified: Secondary | ICD-10-CM | POA: Diagnosis present

## 2019-07-12 DIAGNOSIS — R410 Disorientation, unspecified: Secondary | ICD-10-CM | POA: Diagnosis present

## 2019-07-12 DIAGNOSIS — R531 Weakness: Secondary | ICD-10-CM | POA: Diagnosis not present

## 2019-07-12 DIAGNOSIS — H109 Unspecified conjunctivitis: Secondary | ICD-10-CM | POA: Diagnosis not present

## 2019-07-12 DIAGNOSIS — Z66 Do not resuscitate: Secondary | ICD-10-CM | POA: Diagnosis present

## 2019-07-12 DIAGNOSIS — Z6826 Body mass index (BMI) 26.0-26.9, adult: Secondary | ICD-10-CM

## 2019-07-12 DIAGNOSIS — R6510 Systemic inflammatory response syndrome (SIRS) of non-infectious origin without acute organ dysfunction: Secondary | ICD-10-CM | POA: Diagnosis present

## 2019-07-12 DIAGNOSIS — Z20822 Contact with and (suspected) exposure to covid-19: Secondary | ICD-10-CM | POA: Diagnosis present

## 2019-07-12 DIAGNOSIS — F0390 Unspecified dementia without behavioral disturbance: Secondary | ICD-10-CM | POA: Diagnosis present

## 2019-07-12 DIAGNOSIS — Z955 Presence of coronary angioplasty implant and graft: Secondary | ICD-10-CM

## 2019-07-12 DIAGNOSIS — R634 Abnormal weight loss: Secondary | ICD-10-CM | POA: Diagnosis present

## 2019-07-12 DIAGNOSIS — I10 Essential (primary) hypertension: Secondary | ICD-10-CM | POA: Diagnosis present

## 2019-07-12 DIAGNOSIS — N400 Enlarged prostate without lower urinary tract symptoms: Secondary | ICD-10-CM | POA: Diagnosis present

## 2019-07-12 DIAGNOSIS — Z7982 Long term (current) use of aspirin: Secondary | ICD-10-CM

## 2019-07-12 DIAGNOSIS — D638 Anemia in other chronic diseases classified elsewhere: Secondary | ICD-10-CM | POA: Diagnosis present

## 2019-07-12 DIAGNOSIS — D649 Anemia, unspecified: Secondary | ICD-10-CM | POA: Diagnosis present

## 2019-07-12 DIAGNOSIS — R680 Hypothermia, not associated with low environmental temperature: Secondary | ICD-10-CM | POA: Diagnosis present

## 2019-07-12 DIAGNOSIS — M109 Gout, unspecified: Secondary | ICD-10-CM | POA: Diagnosis present

## 2019-07-12 DIAGNOSIS — Z923 Personal history of irradiation: Secondary | ICD-10-CM

## 2019-07-12 DIAGNOSIS — I2510 Atherosclerotic heart disease of native coronary artery without angina pectoris: Secondary | ICD-10-CM | POA: Diagnosis present

## 2019-07-12 DIAGNOSIS — C61 Malignant neoplasm of prostate: Secondary | ICD-10-CM | POA: Diagnosis present

## 2019-07-12 DIAGNOSIS — R627 Adult failure to thrive: Secondary | ICD-10-CM | POA: Diagnosis present

## 2019-07-12 LAB — COMPREHENSIVE METABOLIC PANEL WITH GFR
ALT: 61 U/L — ABNORMAL HIGH (ref 0–44)
AST: 54 U/L — ABNORMAL HIGH (ref 15–41)
Albumin: 3.1 g/dL — ABNORMAL LOW (ref 3.5–5.0)
Alkaline Phosphatase: 76 U/L (ref 38–126)
Anion gap: 8 (ref 5–15)
BUN: 29 mg/dL — ABNORMAL HIGH (ref 8–23)
CO2: 27 mmol/L (ref 22–32)
Calcium: 9.3 mg/dL (ref 8.9–10.3)
Chloride: 106 mmol/L (ref 98–111)
Creatinine, Ser: 0.79 mg/dL (ref 0.61–1.24)
GFR calc Af Amer: >60 mL/min
GFR calc non Af Amer: >60 mL/min
Glucose, Bld: 78 mg/dL (ref 70–99)
Potassium: 4.5 mmol/L (ref 3.5–5.1)
Sodium: 141 mmol/L (ref 135–145)
Total Bilirubin: 0.6 mg/dL (ref 0.3–1.2)
Total Protein: 6.9 g/dL (ref 6.5–8.1)

## 2019-07-12 LAB — LACTIC ACID, PLASMA: Lactic Acid, Venous: 1.1 mmol/L (ref 0.5–1.9)

## 2019-07-12 LAB — URINALYSIS, ROUTINE W REFLEX MICROSCOPIC
Bacteria, UA: NONE SEEN
Bilirubin Urine: NEGATIVE
Glucose, UA: NEGATIVE mg/dL
Hgb urine dipstick: NEGATIVE
Ketones, ur: NEGATIVE mg/dL
Leukocytes,Ua: NEGATIVE
Nitrite: NEGATIVE
Protein, ur: 30 mg/dL — AB
Specific Gravity, Urine: 1.018 (ref 1.005–1.030)
pH: 5 (ref 5.0–8.0)

## 2019-07-12 LAB — CBC WITH DIFFERENTIAL/PLATELET
Abs Immature Granulocytes: 0.02 K/uL (ref 0.00–0.07)
Basophils Absolute: 0 K/uL (ref 0.0–0.1)
Basophils Relative: 0 %
Eosinophils Absolute: 0.1 K/uL (ref 0.0–0.5)
Eosinophils Relative: 1 %
HCT: 36.7 % — ABNORMAL LOW (ref 39.0–52.0)
Hemoglobin: 11.9 g/dL — ABNORMAL LOW (ref 13.0–17.0)
Immature Granulocytes: 0 %
Lymphocytes Relative: 12 %
Lymphs Abs: 0.9 K/uL (ref 0.7–4.0)
MCH: 31 pg (ref 26.0–34.0)
MCHC: 32.4 g/dL (ref 30.0–36.0)
MCV: 95.6 fL (ref 80.0–100.0)
Monocytes Absolute: 0.5 K/uL (ref 0.1–1.0)
Monocytes Relative: 8 %
Neutro Abs: 5.7 K/uL (ref 1.7–7.7)
Neutrophils Relative %: 79 %
Platelets: 107 K/uL — ABNORMAL LOW (ref 150–400)
RBC: 3.84 MIL/uL — ABNORMAL LOW (ref 4.22–5.81)
RDW: 15.5 % (ref 11.5–15.5)
WBC: 7.2 K/uL (ref 4.0–10.5)
nRBC: 0 % (ref 0.0–0.2)

## 2019-07-12 LAB — RESPIRATORY PANEL BY RT PCR (FLU A&B, COVID)
Influenza A by PCR: NEGATIVE
Influenza B by PCR: NEGATIVE
SARS Coronavirus 2 by RT PCR: NEGATIVE

## 2019-07-12 LAB — CK: Total CK: 627 U/L — ABNORMAL HIGH (ref 49–397)

## 2019-07-12 LAB — LACTATE DEHYDROGENASE: LDH: 231 U/L — ABNORMAL HIGH (ref 98–192)

## 2019-07-12 LAB — FERRITIN: Ferritin: 201 ng/mL (ref 24–336)

## 2019-07-12 LAB — MAGNESIUM: Magnesium: 1.9 mg/dL (ref 1.7–2.4)

## 2019-07-12 LAB — C-REACTIVE PROTEIN: CRP: 4.8 mg/dL — ABNORMAL HIGH (ref <1.0)

## 2019-07-12 LAB — VITAMIN B12: Vitamin B-12: 881 pg/mL (ref 180–914)

## 2019-07-12 LAB — TROPONIN I (HIGH SENSITIVITY): Troponin I (High Sensitivity): 12 ng/L (ref <18)

## 2019-07-12 LAB — POC SARS CORONAVIRUS 2 AG -  ED: SARS Coronavirus 2 Ag: NEGATIVE

## 2019-07-12 LAB — PHOSPHORUS: Phosphorus: 3.8 mg/dL (ref 2.5–4.6)

## 2019-07-12 LAB — LIPASE, BLOOD: Lipase: 38 U/L (ref 11–51)

## 2019-07-12 LAB — TSH: TSH: 3.172 u[IU]/mL (ref 0.350–4.500)

## 2019-07-12 MED ORDER — ONDANSETRON HCL 4 MG/2ML IJ SOLN: 4.0000 mg | Freq: Four times a day (QID) | INTRAMUSCULAR | Status: DC | PRN

## 2019-07-12 MED ORDER — SODIUM CHLORIDE 0.9 % IV BOLUS
1000.0000 mL | Freq: Once | INTRAVENOUS | Status: AC
  Administered 2019-07-12: 1000 mL via INTRAVENOUS

## 2019-07-12 MED ORDER — ACETAMINOPHEN 650 MG RE SUPP: 650.0000 mg | Freq: Four times a day (QID) | RECTAL | Status: DC | PRN

## 2019-07-12 MED ORDER — ONDANSETRON HCL 4 MG PO TABS: 4.0000 mg | ORAL_TABLET | Freq: Four times a day (QID) | ORAL | Status: DC | PRN

## 2019-07-12 MED ORDER — ASPIRIN EC 81 MG PO TBEC
81.0000 mg | DELAYED_RELEASE_TABLET | Freq: Every day | ORAL | Status: DC
  Administered 2019-07-13 – 2019-07-19 (×6): 81 mg via ORAL
  Filled 2019-07-12 (×6): qty 1

## 2019-07-12 MED ORDER — ACETAMINOPHEN 325 MG PO TABS
650.0000 mg | ORAL_TABLET | Freq: Four times a day (QID) | ORAL | Status: DC | PRN
  Administered 2019-07-17 – 2019-07-18 (×3): 650 mg via ORAL
  Filled 2019-07-12 (×3): qty 2

## 2019-07-12 MED ORDER — HYDROCODONE-ACETAMINOPHEN 5-325 MG PO TABS
1.0000 | ORAL_TABLET | ORAL | Status: DC | PRN
  Administered 2019-07-16: 1 via ORAL
  Filled 2019-07-12: qty 1

## 2019-07-12 MED ORDER — SODIUM CHLORIDE 0.9 % IV SOLN: INTRAVENOUS | Status: DC

## 2019-07-12 MED ORDER — ATORVASTATIN CALCIUM 10 MG PO TABS
10.0000 mg | ORAL_TABLET | Freq: Every day | ORAL | Status: DC
  Administered 2019-07-13 – 2019-07-18 (×5): 10 mg via ORAL
  Filled 2019-07-12 (×6): qty 1

## 2019-07-12 MED ORDER — MIRABEGRON ER 25 MG PO TB24
50.0000 mg | ORAL_TABLET | Freq: Every day | ORAL | Status: DC
  Administered 2019-07-13 – 2019-07-19 (×6): 50 mg via ORAL
  Filled 2019-07-12 (×7): qty 2

## 2019-07-12 NOTE — ED Notes (Signed)
Bair Hugger reapplied.
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
## 2019-07-12 NOTE — ED Notes (Signed)
3 warm blankets applied to patient D/T rectal temp.

## 2019-07-12 NOTE — ED Triage Notes (Signed)
Pt BIB [facility] from [facility] in [place] with increased weakness. Per EMS pt is able to ambulate normally with a walker, however, since Monday, facility noticed he has had increased weakness on the right side. Beginning Thursday, pt began having increased confusion. Additionally, pt has been frequently using the bathroom to urinate with small to no amount of output. Pt is A&Ox4. States he is weaker in his right leg and his arms are hurting 4/10 pain.

## 2019-07-12 NOTE — H&P (Signed)
[name] FAO:[id number] DOB: February 11, 1930 DOA: 07/12/2019     PCP: Ralene Ok, MD   Outpatient Specialists:   CARDS:   Dr. Jacinto Halim    Patient arrived to ER on 07/12/19 at 1720  Patient coming from: From facility [facility]  Chief Complaint:   Chief Complaint  Patient presents with  . Weakness    HPI: Seth Mccall is a 86 y.o. male with medical history significant of coronary artery disease, hypertension, gout, prostate cancer, arthritis, requiring back fusion of C3-C4-C5   Presented with   worsening confusion At his baseline able to ambulate with a walker but has been feeling worse since Monday for the past 6 days.  Seems like he has worsening of his chronic right-sided weakness.  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 Patient has been trying to urinate more frequently with small amount of urine produced also reported upper extremity pain Initially hypothermic down to 95 on arrival to emergency department And started on bear hugger Per nursing home staff seem like he has been declining over the past week or so Per daughter who is practicing gerontologist patient has been having some mental status changes for past week seem to be disoriented  Patient has been vaccinated for COVID x2  Infectious risk factors:  Reports  severe fatigue   inability to completely isolate    In  ER RAPID COVID TEST  NEGATIVE   in house  PCR testing  Pending  No results found for: SARSCOV2NAA   Regarding pertinent Chronic problems:     Hyperlipidemia -  on statins Lipitor   HTN on metoprol and recently added lisinopril last echogram in 2019 showed no evidence of CHF with preserved EF      CAD  - On Aspirin, statin, betablocker,                  -  followed by cardiology                - last cardiac cath 2019 when he undergone stenting to his proximal LAD with DES mild  right coronary disease      While in ER: Initially hypothermic down to 95 requiring Bair hugger patient actively coughing Confused unable to provide his own history ER provider discussed case with patient's daughter  Who is a Museum/gallery curator.  Per her request B12 and TSH was obtained appear to be within normal limits CT scan head nonacute MRI of head and neck ordered UA unremarkable Initial rapid Covid is negative awaiting results of PCR  The following Work up has been ordered so far:  Orders Placed This Encounter  Procedures  . Blood Culture (routine x 2)  . Urine culture  . Respiratory Panel by RT PCR (Flu A&B, Covid) - Nasopharyngeal Swab  . DG Chest Port 1 View  . CT Head Wo Contrast  . MR BRAIN WO CONTRAST  . MR Cervical Spine Wo Contrast  . Lactic acid, plasma  . Comprehensive metabolic panel  . CBC WITH DIFFERENTIAL  . Urinalysis, Routine w reflex microscopic  . TSH  . Vitamin B12  . Diet NPO time specified  . Cardiac monitoring  . Refer to Sidebar Report: Sepsis Sidebar ED/IP  . Document vital signs within 1-hour of fluid bolus completion and notify provider of bolus completion  . Document height and weight  . Insert peripheral IV x 2  .  Initiate Carrier Fluid Protocol  . Apply warming blanket [facility])  . Check Rectal Temperature  . In and Out Cath  . Check Rectal Temperature  . Consult to hospitalist  ALL PATIENTS BEING ADMITTED/HAVING PROCEDURES NEED COVID-19 SCREENING  . Airborne and Contact precautions  . Pulse oximetry, continuous  . POC SARS Coronavirus 2 Ag-ED - Nasal Swab (BD Veritor Kit)  . ED EKG 12-Lead  . EKG 12-Lead     Following Medications were ordered in ER: Medications  sodium chloride 0.9 % bolus 1,000 mL (1,000 mLs Intravenous New Bag/Given 07/12/19 2028)        Consult Orders  (From admission, onward)         Start     Ordered   07/12/19 2115  Consult to hospitalist  ALL PATIENTS BEING ADMITTED/HAVING  PROCEDURES NEED COVID-19 SCREENING  Once    Comments: ALL PATIENTS BEING ADMITTED/HAVING PROCEDURES NEED COVID-19 SCREENING  Provider:  (Not yet assigned)  Question Answer Comment  Place call to: [facility]   Reason for Consult Admit      07/12/19 2114          Significant initial  Findings: Abnormal Labs Reviewed  COMPREHENSIVE METABOLIC PANEL - Abnormal; Notable for the following components:      Result Value   BUN 29 (*)    Albumin 3.1 (*)    AST 54 (*)    ALT 61 (*)    All other components within normal limits  CBC WITH DIFFERENTIAL/PLATELET - Abnormal; Notable for the following components:   RBC 3.84 (*)    Hemoglobin 11.9 (*)    HCT 36.7 (*)    Platelets 107 (*)    All other components within normal limits  URINALYSIS, ROUTINE W REFLEX MICROSCOPIC - Abnormal; Notable for the following components:   Protein, ur 30 (*)    All other components within normal limits   Otherwise labs showing:    Recent Labs  Lab 07/12/19 1819  NA 141  K 4.5  CO2 27  GLUCOSE 78  BUN 29*  CREATININE 0.79  CALCIUM 9.3    Cr    stable,    Lab Results  Component Value Date   CREATININE 0.79 07/12/2019   CREATININE 0.80 08/21/2017   CREATININE 0.94 08/13/2017    Recent Labs  Lab 07/12/19 1819  AST 54*  ALT 61*  ALKPHOS 76  BILITOT 0.6  PROT 6.9  ALBUMIN 3.1*   Lab Results  Component Value Date   CALCIUM 9.3 07/12/2019    WBC      Component Value Date/Time   WBC 7.2 07/12/2019 1819   ANC    Component Value Date/Time   NEUTROABS 5.7 07/12/2019 1819   ALC No components found for: LYMPHAB    Plt: Lab Results  Component Value Date   PLT 107 (L) 07/12/2019    Lactic Acid, Venous    Component Value Date/Time   LATICACIDVEN 1.1 07/12/2019 1819    Procalcitonin   Ordered   COVID-19 Labs  No results for input(s): DDIMER, FERRITIN, LDH, CRP in the last 72 hours.  Lab Results  Component Value Date   SARSCOV2NAA NEGATIVE 07/12/2019       HG/HCT   Stable     Component Value Date/Time   HGB 11.9 (L) 07/12/2019 1819   HCT 36.7 (L) 07/12/2019 1819    No results for input(s): LIPASE, AMYLASE in the last 168 hours. No results for input(s): AMMONIA in the last 168 hours.  No components found for: LABALBU   Troponin  ordered     ECG: Ordered Personally reviewed by me showing: HR : 67 Rhythm:  NSR,     no evidence of ischemic changes QTC 422     UA   no evidence of UTI    Urine analysis:    Component Value Date/Time   COLORURINE YELLOW 07/12/2019 2009   APPEARANCEUR CLEAR 07/12/2019 2009   LABSPEC 1.018 07/12/2019 2009   PHURINE 5.0 07/12/2019 2009   GLUCOSEU NEGATIVE 07/12/2019 2009   HGBUR NEGATIVE 07/12/2019 2009   BILIRUBINUR NEGATIVE 07/12/2019 2009   KETONESUR NEGATIVE 07/12/2019 2009   PROTEINUR 30 (A) 07/12/2019 2009   UROBILINOGEN 0.2 10/11/2012 1758   NITRITE NEGATIVE 07/12/2019 2009   LEUKOCYTESUR NEGATIVE 07/12/2019 2009      Ordered  CT HEAD   NON acute  CXR - NON acute     ED Triage Vitals  Enc Vitals Group     BP 07/12/19 1736 137/87     Pulse Rate 07/12/19 1742 76     Resp 07/12/19 1742 18     Temp 07/12/19 1736 (!) 95 F (35 C)     Temp Source 07/12/19 1736 Rectal     SpO2 07/12/19 1742 100 %     Weight --      Height --      Head Circumference --      Peak Flow --      Pain Score 07/12/19 1738 4     Pain Loc --      Pain Edu? --      Excl. in GC? --   TMAX(24)@       Latest  Blood pressure (!) 103/56, pulse 85, temperature 99 F (37.2 C), temperature source Rectal, resp. rate 16, SpO2 98 %.    Hospitalist was called for admission for acute encephalopathy   Review of Systems:    Pertinent positives include:   non-productive cough  fatigue,   confusion urgency or frequency Constitutional: No weight loss, night sweats, Fevers, chills,weight loss  HEENT:  No headaches, Difficulty swallowing,Tooth/dental problems,Sore throat,  No sneezing, itching, ear ache,  nasal congestion, post nasal drip,  Cardio-vascular:  No chest pain, Orthopnea, PND, anasarca, dizziness, palpitations.no Bilateral lower extremity swelling  GI:  No heartburn, indigestion, abdominal pain, nausea, vomiting, diarrhea, change in bowel habits, loss of appetite, melena, blood in stool, hematemesis Resp:  no shortness of breath at rest. No dyspnea on exertion, No excess mucus, no productive cough, Noh, No coughing up of blood.No change in color of mucus.No wheezing. Skin:  no rash or lesions. No jaundice GU:  no dysuria, change in color of urine, no. No straining to urinate.  No flank pain.  Musculoskeletal:  No joint pain or no joint swelling. No decreased range of motion. No back pain.  Psych:  No change in mood or affect. No depression or anxiety. No memory loss.  Neuro: no localizing neurological complaints, no tingling, no weakness, no double vision, no gait abnormality, no slurred speech, no  All systems reviewed and apart from HOPI all are negative  Past Medical History:   Past Medical History:  Diagnosis Date  . Arthritis   . Complication of anesthesia    limited movement in neck because of fusion  . Gout   . Hypertension   . Prostate cancer [facility])      Past Surgical History:  Procedure Laterality Date  . BACK SURGERY  2005   fusion of  C3, C4, and C5  . CATARACT EXTRACTION Bilateral   . CORONARY ATHERECTOMY N/A 08/12/2017   Procedure: CORONARY ATHERECTOMY;  Surgeon: Yates Decamp, MD;  Location: [facility] INVASIVE CV LAB;  Service: Cardiovascular;  Laterality: N/A;  . CORONARY STENT INTERVENTION N/A 08/12/2017   Procedure: CORONARY STENT INTERVENTION;  Surgeon: Yates Decamp, MD;  Location: [facility] INVASIVE CV LAB;  Service: Cardiovascular;  Laterality: N/A;  . GREEN LIGHT LASER TURP (TRANSURETHRAL RESECTION OF PROSTATE N/A 10/10/2012   Procedure: GREEN LIGHT LASER TURP (TRANSURETHRAL RESECTION OF PROSTATE;  Surgeon: Garnett Farm, MD;  Location: [facility] ORS;  Service: Urology;   Laterality: N/A;  . LEFT HEART CATH AND CORONARY ANGIOGRAPHY N/A 08/12/2017   Procedure: LEFT HEART CATH AND CORONARY ANGIOGRAPHY;  Surgeon: Yates Decamp, MD;  Location: [facility] INVASIVE CV LAB;  Service: Cardiovascular;  Laterality: N/A;  . PROSTATE BIOPSY    . TRANSURETHRAL RESECTION OF PROSTATE  10-10-2012    Social History:  Ambulatory   Dan Humphreys      reports that he quit smoking about 51 years ago. His smoking use included cigarettes. He has a 2.00 pack-year smoking history. He has never used smokeless tobacco. He reports current alcohol use. He reports that he does not use drugs.   Family History:   Family History  Problem Relation Age of Onset  . Hypertension Other     Allergies: Allergies  Allergen Reactions  . Pravastatin      Prior to Admission medications   Medication Sig Start Date End Date Taking? Authorizing Provider  aspirin EC 81 MG EC tablet Take 1 tablet (81 mg total) by mouth daily. 08/13/17   Yates Decamp, MD  atorvastatin (LIPITOR) 10 MG tablet Take 1 tablet (10 mg total) by mouth daily at 6 PM. 08/13/17   Yates Decamp, MD  colchicine 0.6 MG tablet Take 0.6 mg by mouth 2 (two) times daily. Reported on 06/02/2015    [provider]  metoprolol succinate (TOPROL-XL) 25 MG 24 hr tablet Take 0.5 tablets (12.5 mg total) by mouth daily. 08/13/17   Yates Decamp, MD  mirabegron ER (MYRBETRIQ) 25 MG TB24 tablet Take 25 mg by mouth daily.    [provider]  nitroGLYCERIN (NITROSTAT) 0.4 MG SL tablet Place 1 tablet (0.4 mg total) under the tongue every 5 (five) minutes x 3 doses as needed for chest pain. 08/13/17   Yates Decamp, MD  Vitamin D, Cholecalciferol, 50 MCG (2000 UT) CAPS Take by mouth daily.    [provider]   Physical Exam: Blood pressure (!) 103/56, pulse 85, temperature 99 F (37.2 C), temperature source Rectal, resp. rate 16, SpO2 98 %. 1. General:  in No Acute distress    Chronically ill  -appearing 2. Psychological: Alert and but not  Oriented 3. Head/ENT:    Dry Mucous Membranes                          Head Non traumatic, neck supple                            Poor Dentition 4. SKIN:   decreased Skin turgor,  Skin clean Dry and intact no rash 5. Heart: Regular rate and rhythm no  Murmur, no Rub or gallop 6. Lungs: , no wheezes or crackles   7. Abdomen: Soft,  non-tender, Non distended  bowel sounds present 8. Lower extremities: no clubbing, cyanosis,  Edema bilateral with  evidence of venous stasis 9. Neurologically appears to be diminished throughout not fully cooperative with exam  10. MSK: Normal range of motion   All other LABS:     Recent Labs  Lab 07/12/19 1819  WBC 7.2  NEUTROABS 5.7  HGB 11.9*  HCT 36.7*  MCV 95.6  PLT 107*     Recent Labs  Lab 07/12/19 1819  NA 141  K 4.5  CL 106  CO2 27  GLUCOSE 78  BUN 29*  CREATININE 0.79  CALCIUM 9.3     Recent Labs  Lab 07/12/19 1819  AST 54*  ALT 61*  ALKPHOS 76  BILITOT 0.6  PROT 6.9  ALBUMIN 3.1*       Cultures:    Component Value Date/Time   SDES  07/12/2019 1803    BLOOD RIGHT ANTECUBITAL Performed at [facility], [address] [place], [place] [address]    SPECREQUEST  07/12/2019 1803    BOTTLES DRAWN AEROBIC AND ANAEROBIC Blood Culture results may not be optimal due to an excessive volume of blood received in culture bottles Performed at [facility], [address], [place], [place] [address]    CULT PENDING 07/12/2019 1803   REPTSTATUS PENDING 07/12/2019 1803     Radiological Exams on Admission: CT Head Wo Contrast  Result Date: 07/12/2019 CLINICAL DATA:  Increasing right-sided weakness for 3 days EXAM: CT HEAD WITHOUT CONTRAST TECHNIQUE: Contiguous axial images were obtained from the base of the skull through the vertex without intravenous contrast. COMPARISON:  05/06/2019 FINDINGS: Brain: No acute infarct or hemorrhage. Lateral ventricles and midline structures are unremarkable. Stable  basal ganglia calcifications. No acute extra-axial fluid collections. No mass effect. Vascular: No hyperdense vessel or unexpected calcification. Skull: Normal. Negative for fracture or focal lesion. Sinuses/Orbits: No acute finding. Other: None IMPRESSION: 1. No acute intracranial process. Electronically Signed   By: Sharlet Salina M.D.   On: 07/12/2019 19:38   DG Chest Port 1 View  Result Date: 07/12/2019 CLINICAL DATA:  Hypothermia EXAM: PORTABLE CHEST 1 VIEW COMPARISON:  08/21/2017 FINDINGS: The heart size and mediastinal contours are within normal limits. Both lungs are clear. The visualized skeletal structures are unremarkable. IMPRESSION: No active disease. Electronically Signed   By: Deatra Robinson M.D.   On: 07/12/2019 19:01    Chart has been reviewed    Assessment/Plan  86 y.o. male with medical history significant of coronary artery disease, hypertension, gout, prostate cancer, arthritis, requiring back fusion of C3-C4-C5  Admitted for acute mental status changes possible delirium in the setting of SIRS elevated LFTs  Present on Admission: . SIRS (systemic inflammatory response syndrome) (HCC) -unclear etiology      Initial lactic acid Lactic Acid, Venous    Component Value Date/Time   LATICACIDVEN 1.1 07/12/2019 1819   Source most likely: unknown  -We will rehydrate,  follow lactic acid unclear if underlying infectious etiology versus viral illness  - Await results of blood and urine culture and adjust antibiotics as needed Covid pending  Given elevated LFTs will obtain right upper quadrant [place]   . Acute metabolic encephalopathy -   - most likely multifactorial secondary to combination of   mild dehydration secondary to decreased by mouth intake but will assess for other etiologies  - Will rehydrate   - Hold contributing medications   - if no improvement may need further imaging to evaluate for CNS pathology pathology    MRI of the brain -ordred  - neurological exam  appears to be nonfocal but patient unable to cooperate fully   - VBG ordered  ammonia  Ordered Initial rapid  Covid is negative pCR pending Patient may have progressive dementia.  Per family request will have behavioral health assess  . Malignant neoplasm of prostate (HCC) - chronic   . Elevated LFTs obtain RUQ US   Venous stasis  Ulcers - wound care consult, he requested to have his UNABOOT removed today  Essential hypertension we will hold off on home medications given somewhat low blood pressures today restart when able   Cough and runny nose patient has had Covid vaccination.  If Covid negative will discuss see precautions.  Chest x-ray without any abnormality will have speech pathology evaluate as patient has been having some choking-like episodes per family    Other plan as per orders.  DVT prophylaxis:  SCD      Code Status:   DNR/DNI  as per  family  I had personally discussed CODE STATUS with   Family    Family Communication:   Family not at  Bedside  plan of care was discussed on the phone with   Daughter,  Daughter is a practicing physician requesting kindly to have regular updates regarding her father please attempt to contact her tomorrow once work-up is completed  Disposition Plan:                             Back to current facility when stable                                              Would benefit from PT/OT eval prior to DC  Ordered                   Swallow eval - SLP ordered                  transitional team consulted                   Nutrition    consulted                  Wound care  consulted                                Behavioral health  consulted                    Consults called: none  Admission status:  ED Disposition    ED Disposition Condition Comment   Admit  The patient appears reasonably stabilized for admission considering the current resources, flow, and capabilities available in the ED at this time, and I doubt any other [facility]  requiring further screening and/or treatment in the ED prior to admission is  present.       Obs     Level of care     tele  For  24H    Precautions: admitted as covid negative  No active isolations   PPE: Used by the provider:   P100  eye Goggles,  Gloves      Etsuko Dierolf 07/12/2019, 11:42 PM    [facility]     after 2 AM please page floor coverage PA If 7AM-7PM, please contact  the day team taking care of the patient using [email or web address]   Patient was evaluated in the context of the global COVID-19 pandemic, which necessitated consideration that the patient might be at risk for infection with the SARS-CoV-2 virus that causes COVID-19. Institutional protocols and algorithms that pertain to the evaluation of patients at risk for COVID-19 are in a state of rapid change based on information released by regulatory bodies including the CDC and federal and state organizations. These policies and algorithms were followed during the patient's care.

## 2019-07-12 NOTE — ED Notes (Signed)
Warming blanket applied.

## 2019-07-13 ENCOUNTER — Inpatient Hospital Stay (HOSPITAL_COMMUNITY): Payer: Medicare HMO

## 2019-07-13 ENCOUNTER — Observation Stay (HOSPITAL_COMMUNITY): Payer: Medicare HMO

## 2019-07-13 ENCOUNTER — Other Ambulatory Visit: Payer: Self-pay

## 2019-07-13 DIAGNOSIS — I2510 Atherosclerotic heart disease of native coronary artery without angina pectoris: Secondary | ICD-10-CM | POA: Diagnosis present

## 2019-07-13 DIAGNOSIS — G9341 Metabolic encephalopathy: Secondary | ICD-10-CM | POA: Diagnosis present

## 2019-07-13 DIAGNOSIS — N400 Enlarged prostate without lower urinary tract symptoms: Secondary | ICD-10-CM | POA: Diagnosis present

## 2019-07-13 DIAGNOSIS — R7989 Other specified abnormal findings of blood chemistry: Secondary | ICD-10-CM | POA: Diagnosis not present

## 2019-07-13 DIAGNOSIS — G9340 Encephalopathy, unspecified: Secondary | ICD-10-CM | POA: Diagnosis not present

## 2019-07-13 DIAGNOSIS — R531 Weakness: Secondary | ICD-10-CM | POA: Diagnosis present

## 2019-07-13 DIAGNOSIS — Z888 Allergy status to other drugs, medicaments and biological substances status: Secondary | ICD-10-CM | POA: Diagnosis not present

## 2019-07-13 DIAGNOSIS — Z955 Presence of coronary angioplasty implant and graft: Secondary | ICD-10-CM | POA: Diagnosis not present

## 2019-07-13 DIAGNOSIS — I10 Essential (primary) hypertension: Secondary | ICD-10-CM | POA: Diagnosis present

## 2019-07-13 DIAGNOSIS — E860 Dehydration: Secondary | ICD-10-CM | POA: Diagnosis present

## 2019-07-13 DIAGNOSIS — Z66 Do not resuscitate: Secondary | ICD-10-CM | POA: Diagnosis present

## 2019-07-13 DIAGNOSIS — M1990 Unspecified osteoarthritis, unspecified site: Secondary | ICD-10-CM | POA: Diagnosis present

## 2019-07-13 DIAGNOSIS — Z20822 Contact with and (suspected) exposure to covid-19: Secondary | ICD-10-CM | POA: Diagnosis present

## 2019-07-13 DIAGNOSIS — R410 Disorientation, unspecified: Secondary | ICD-10-CM | POA: Diagnosis not present

## 2019-07-13 DIAGNOSIS — M109 Gout, unspecified: Secondary | ICD-10-CM | POA: Diagnosis present

## 2019-07-13 DIAGNOSIS — E46 Unspecified protein-calorie malnutrition: Secondary | ICD-10-CM | POA: Diagnosis present

## 2019-07-13 DIAGNOSIS — R1312 Dysphagia, oropharyngeal phase: Secondary | ICD-10-CM | POA: Diagnosis present

## 2019-07-13 DIAGNOSIS — Z9079 Acquired absence of other genital organ(s): Secondary | ICD-10-CM | POA: Diagnosis not present

## 2019-07-13 DIAGNOSIS — D638 Anemia in other chronic diseases classified elsewhere: Secondary | ICD-10-CM | POA: Diagnosis present

## 2019-07-13 DIAGNOSIS — E785 Hyperlipidemia, unspecified: Secondary | ICD-10-CM | POA: Diagnosis present

## 2019-07-13 DIAGNOSIS — R6510 Systemic inflammatory response syndrome (SIRS) of non-infectious origin without acute organ dysfunction: Secondary | ICD-10-CM | POA: Diagnosis present

## 2019-07-13 DIAGNOSIS — Z6826 Body mass index (BMI) 26.0-26.9, adult: Secondary | ICD-10-CM | POA: Diagnosis not present

## 2019-07-13 DIAGNOSIS — Z515 Encounter for palliative care: Secondary | ICD-10-CM | POA: Diagnosis not present

## 2019-07-13 DIAGNOSIS — R680 Hypothermia, not associated with low environmental temperature: Secondary | ICD-10-CM | POA: Diagnosis present

## 2019-07-13 DIAGNOSIS — Z7982 Long term (current) use of aspirin: Secondary | ICD-10-CM | POA: Diagnosis not present

## 2019-07-13 DIAGNOSIS — Z8546 Personal history of malignant neoplasm of prostate: Secondary | ICD-10-CM | POA: Diagnosis not present

## 2019-07-13 DIAGNOSIS — I878 Other specified disorders of veins: Secondary | ICD-10-CM | POA: Diagnosis present

## 2019-07-13 DIAGNOSIS — J690 Pneumonitis due to inhalation of food and vomit: Secondary | ICD-10-CM | POA: Diagnosis present

## 2019-07-13 DIAGNOSIS — Z87891 Personal history of nicotine dependence: Secondary | ICD-10-CM | POA: Diagnosis not present

## 2019-07-13 LAB — RETICULOCYTES
Immature Retic Fract: 16.4 % — ABNORMAL HIGH (ref 2.3–15.9)
RBC.: 3.47 MIL/uL — ABNORMAL LOW (ref 4.22–5.81)
Retic Count, Absolute: 57.6 10*3/uL (ref 19.0–186.0)
Retic Ct Pct: 1.7 % (ref 0.4–3.1)

## 2019-07-13 LAB — COMPREHENSIVE METABOLIC PANEL
ALT: 51 U/L — ABNORMAL HIGH (ref 0–44)
AST: 47 U/L — ABNORMAL HIGH (ref 15–41)
Albumin: 2.7 g/dL — ABNORMAL LOW (ref 3.5–5.0)
Alkaline Phosphatase: 64 U/L (ref 38–126)
Anion gap: 8 (ref 5–15)
BUN: 27 mg/dL — ABNORMAL HIGH (ref 8–23)
CO2: 24 mmol/L (ref 22–32)
Calcium: 8.7 mg/dL — ABNORMAL LOW (ref 8.9–10.3)
Chloride: 111 mmol/L (ref 98–111)
Creatinine, Ser: 0.93 mg/dL (ref 0.61–1.24)
GFR calc Af Amer: >60 mL/min (ref >60)
GFR calc non Af Amer: >60 mL/min (ref >60)
Glucose, Bld: 74 mg/dL (ref 70–99)
Potassium: 4.4 mmol/L (ref 3.5–5.1)
Sodium: 143 mmol/L (ref 135–145)
Total Bilirubin: 0.6 mg/dL (ref 0.3–1.2)
Total Protein: 6 g/dL — ABNORMAL LOW (ref 6.5–8.1)

## 2019-07-13 LAB — MAGNESIUM: Magnesium: 1.7 mg/dL (ref 1.7–2.4)

## 2019-07-13 LAB — FIBRINOGEN: Fibrinogen: 501 mg/dL — ABNORMAL HIGH (ref 210–475)

## 2019-07-13 LAB — CBC
HCT: 33.3 % — ABNORMAL LOW (ref 39.0–52.0)
Hemoglobin: 10.8 g/dL — ABNORMAL LOW (ref 13.0–17.0)
MCH: 30.7 pg (ref 26.0–34.0)
MCHC: 32.4 g/dL (ref 30.0–36.0)
MCV: 94.6 fL (ref 80.0–100.0)
Platelets: 100 10*3/uL — ABNORMAL LOW (ref 150–400)
RBC: 3.52 MIL/uL — ABNORMAL LOW (ref 4.22–5.81)
RDW: 15.4 % (ref 11.5–15.5)
WBC: 5.4 10*3/uL (ref 4.0–10.5)
nRBC: 0 % (ref 0.0–0.2)

## 2019-07-13 LAB — IRON AND TIBC
Iron: 74 ug/dL (ref 45–182)
Saturation Ratios: 30 % (ref 17.9–39.5)
TIBC: 248 ug/dL — ABNORMAL LOW (ref 250–450)
UIBC: 174 ug/dL

## 2019-07-13 LAB — LACTIC ACID, PLASMA: Lactic Acid, Venous: 0.8 mmol/L (ref 0.5–1.9)

## 2019-07-13 LAB — PREALBUMIN: Prealbumin: 14.1 mg/dL — ABNORMAL LOW (ref 18–38)

## 2019-07-13 LAB — PHOSPHORUS: Phosphorus: 3.6 mg/dL (ref 2.5–4.6)

## 2019-07-13 LAB — URINE CULTURE: Culture: NO GROWTH

## 2019-07-13 LAB — TROPONIN I (HIGH SENSITIVITY): Troponin I (High Sensitivity): 28 ng/L — ABNORMAL HIGH (ref <18)

## 2019-07-13 LAB — PROTIME-INR
INR: 1.2 (ref 0.8–1.2)
Prothrombin Time: 14.7 seconds (ref 11.4–15.2)

## 2019-07-13 LAB — AMMONIA: Ammonia: 34 umol/L (ref 9–35)

## 2019-07-13 LAB — D-DIMER, QUANTITATIVE: D-Dimer, Quant: 0.85 ug/mL-FEU — ABNORMAL HIGH (ref 0.00–0.50)

## 2019-07-13 LAB — FOLATE: Folate: 13.9 ng/mL (ref >5.9)

## 2019-07-13 MED ORDER — ENSURE ENLIVE PO LIQD
237.0000 mL | Freq: Three times a day (TID) | ORAL | Status: DC
  Administered 2019-07-14 – 2019-07-19 (×12): 237 mL via ORAL

## 2019-07-13 MED ORDER — SODIUM CHLORIDE 0.9 % IV SOLN: INTRAVENOUS | Status: DC

## 2019-07-13 MED ORDER — ENOXAPARIN SODIUM 40 MG/0.4ML ~~LOC~~ SOLN
40.0000 mg | SUBCUTANEOUS | Status: DC
  Administered 2019-07-13 – 2019-07-19 (×6): 40 mg via SUBCUTANEOUS
  Filled 2019-07-13 (×5): qty 0.4

## 2019-07-13 MED ORDER — HYDROCERIN EX CREA
TOPICAL_CREAM | Freq: Two times a day (BID) | CUTANEOUS | Status: DC
  Administered 2019-07-16 – 2019-07-18 (×2): 1 via TOPICAL
  Filled 2019-07-13: qty 113

## 2019-07-13 MED ORDER — ADULT MULTIVITAMIN W/MINERALS CH
1.0000 | ORAL_TABLET | Freq: Every day | ORAL | Status: DC
  Administered 2019-07-13 – 2019-07-19 (×6): 1 via ORAL
  Filled 2019-07-13 (×5): qty 1

## 2019-07-13 NOTE — TOC Progression Note (Signed)
Transition of Care [facility]) - Progression Note    Patient Details  Name: Seth Mccall MRN: [id number] Date of Birth: 02-03-1930  Transition of Care [facility]) CM/SW Contact  Purcell Mouton, RN Phone Number: 07/13/2019, 3:29 PM  Clinical Narrative:    Spoke with pt's daughter Seth Mccall [phone] concerning disposition. Pt is from [place] [phone] and daughter would like for pt to return. A call to [facility] was made to confirm. Pt's RN was not available. TOC will continue to follow.    Expected Discharge Plan: Assisted Living Barriers to Discharge: No Barriers Identified  Expected Discharge Plan and Services Expected Discharge Plan: Assisted Living   Discharge Planning Services: CM Consult   Living arrangements for the past 2 months: Assisted Living Facility                                       Social Determinants of Health (SDOH) Interventions    Readmission Risk Interventions No flowsheet data found.

## 2019-07-13 NOTE — Evaluation (Signed)
Physical Therapy Evaluation Patient Details Name: Seth Mccall MRN: [id number] DOB: 07-24-1929 Today's Date: 07/13/2019   History of Present Illness  86 yo male admitted with SIRS, AMS. Hx of CAD, gout, OA, prostate Ca, cervical fusion. Pt is from [facility]  Clinical Impression  On eval, pt required Max-Total Assist +2 for mobility. He was able to stand at bedside for ~20-30 seconds. R side leaning in sitting and standing-pt unable to self correct. Pt presents with general weakness, decreased activity tolerance, and impaired gait and balance. At this time, recommendation is for SNF rehab unless ALF can provide current level of care. Will follow and progress activity as tolerated.     Follow Up Recommendations SNF(unless facility can provide current level of assist)    Equipment Recommendations  None recommended by PT    Recommendations for Other Services       Precautions / Restrictions Precautions Precautions: Fall Restrictions Weight Bearing Restrictions: No      Mobility  Bed Mobility Overal bed mobility: Needs Assistance Bed Mobility: Supine to Sit;Sit to Supine     Supine to sit: Total assist;+2 for physical assistance;+2 for safety/equipment;HOB elevated Sit to supine: Total assist;+2 for physical assistance;+2 for safety/equipment;HOB elevated   General bed mobility comments: Assist for trunk and bil LEs. Utilized bedpad for scooting, positioning.  Transfers Overall transfer level: Needs assistance Equipment used: Rolling walker (2 wheeled) Transfers: Sit to/from Stand Sit to Stand: Max assist;+2 physical assistance;+2 safety/equipment;From elevated surface         General transfer comment: Assist to place R hand on walker handle, rise, stabilize, control descent. Pt stood for ~20-30 seconds. Leaning to R side. Worked on lateral weightshifting.  Ambulation/Gait             General Gait Details: NT-pt too weak  Stairs             Wheelchair Mobility    Modified Rankin (Stroke Patients Only)       Balance Overall balance assessment: Needs assistance Sitting-balance support: Bilateral upper extremity supported;Feet supported Sitting balance-Leahy Scale: Poor Sitting balance - Comments: Leaning to R side, especially without bil UE support. Postural control: Right lateral lean Standing balance support: Bilateral upper extremity supported Standing balance-Leahy Scale: Poor                               Pertinent Vitals/Pain Pain Assessment: No/denies pain Faces Pain Scale: No hurt    Home Living Family/patient expects to be discharged to:: Unsure     Type of Home: Assisted living([facility]) Home Access: Level entry     Home Layout: One level Home Equipment: Walker - 4 wheels      Prior Function Level of Independence: Needs assistance   Gait / Transfers Assistance Needed: uses walker for ambulation     Comments: uses walker for ambulation     Hand Dominance        Extremity/Trunk Assessment   Upper Extremity Assessment Upper Extremity Assessment: Defer to OT evaluation    Lower Extremity Assessment Lower Extremity Assessment: Generalized weakness    Cervical / Trunk Assessment Cervical / Trunk Assessment: Kyphotic  Communication   Communication: No difficulties  Cognition Arousal/Alertness: Awake/alert Behavior During Therapy: WFL for tasks assessed/performed Overall Cognitive Status: No family/caregiver present to determine baseline cognitive functioning  General Comments      Exercises     Assessment/Plan    PT Assessment Patient needs continued PT services  PT Problem List Decreased strength;Decreased mobility;Decreased activity tolerance;Decreased balance       PT Treatment Interventions DME instruction;Gait training;Therapeutic activities;Therapeutic exercise;Patient/family  education;Functional mobility training;Balance training    PT Goals (Current goals can be found in the Care Plan section)  Acute Rehab PT Goals Patient Stated Goal: family would like pt to return to ALF PT Goal Formulation: Patient unable to participate in goal setting Time For Goal Achievement: 07/27/19 Potential to Achieve Goals: Fair    Frequency Min 2X/week   Barriers to discharge        Co-evaluation               AM-PAC PT "6 Clicks" Mobility  Outcome Measure Help needed turning from your back to your side while in a flat bed without using bedrails?: Total Help needed moving from lying on your back to sitting on the side of a flat bed without using bedrails?: Total Help needed moving to and from a bed to a chair (including a wheelchair)?: Total Help needed standing up from a chair using your arms (e.g., wheelchair or bedside chair)?: Total Help needed to walk in hospital room?: Total Help needed climbing 3-5 steps with a railing? : Total 6 Click Score: 6    End of Session Equipment Utilized During Treatment: Gait belt Activity Tolerance: Patient tolerated treatment well Patient left: in bed;with call bell/phone within reach;with bed alarm set   PT Visit Diagnosis: Muscle weakness (generalized) (M62.81);Other abnormalities of gait and mobility (R26.89)    Time: 1324-4010 PT Time Calculation (min) (ACUTE ONLY): 15 min   Charges:   PT Evaluation $PT Eval Moderate Complexity: 1 Mod            Kurtis Anastasia P, PT Acute Rehabilitation

## 2019-07-13 NOTE — Plan of Care (Signed)
Pt admitted to 1423 via ED, lethargic, stiff on arrival. Awakens to voice, is a+ox2, total with ADLs. Pt oriented to room and floor routine. POC reviewed with some understanding verbalized. Will continue to monitor per in coming RN  Problem: Education: Goal: Knowledge of General Education information will improve Description: Including pain rating scale, medication(s)/side effects and non-pharmacologic comfort measures Outcome: Progressing   Problem: Health Behavior/Discharge Planning: Goal: Ability to manage health-related needs will improve Outcome: Progressing   Problem: Clinical Measurements: Goal: Ability to maintain clinical measurements within normal limits will improve Outcome: Progressing Goal: Will remain free from infection Outcome: Progressing Goal: Diagnostic test results will improve Outcome: Progressing Goal: Respiratory complications will improve Outcome: Progressing Goal: Cardiovascular complication will be avoided Outcome: Progressing   Problem: Activity: Goal: Risk for activity intolerance will decrease Outcome: Progressing   Problem: Nutrition: Goal: Adequate nutrition will be maintained Outcome: Progressing   Problem: Coping: Goal: Level of anxiety will decrease Outcome: Progressing   Problem: Elimination: Goal: Will not experience complications related to bowel motility Outcome: Progressing Goal: Will not experience complications related to urinary retention Outcome: Progressing   Problem: Pain Managment: Goal: General experience of comfort will improve Outcome: Progressing   Problem: Safety: Goal: Ability to remain free from injury will improve Outcome: Progressing   Problem: Skin Integrity: Goal: Risk for impaired skin integrity will decrease Outcome: Progressing

## 2019-07-13 NOTE — Progress Notes (Signed)
OT Cancellation Note  Patient Details Name: Seth Mccall MRN: [id number] DOB: [birth year]   Cancelled Treatment:    Reason Eval/Treat Not Completed: Patient at procedure or test/ unavailable  Will check on pt next day Kari Baars, [place] [phone] Office- [phone], Thereasa Parkin 07/13/2019, 3:26 PM

## 2019-07-13 NOTE — Progress Notes (Signed)
MBS completed, preliminary report indicates only mild oropharyngeal dysphagia. Full report to follow. Decreased oral cohesion of boluses due to weakness/discoordination. Pharyngeal swallow is strong without severe residuals.  However pt is observed to have mild backflow of thin (mixed with secretions) from inadequate sustained opening of UES with subsequent mild aspiration of backflowed material.  He did not overtly cough with mild aspiration but cued cough was helpful.    Upon esophageal sweep, pt appeared with retention of barium distally without awareness.  Puree nor thin bolus additions appeared to facilitate clearance.  Radiologist not present to confirm.  Suspect this is contributing to pt's aspiration risk.  Informed pt and daughter to concerns.  Pt unable to have an esophagram due to his aspiration with this test.  Would recommend small frequent meals be provided to pt given concerns for esophageal clearance - especially given his not sensate.    Minimum chin tuck posture noted - as pt's ACDF - fusion C3, C4, C5 prevents full posture.  SLP did not test barium tablet and would advise pill of significant size be crushed or be given in suspension form.  .    Test results discussed briefly with pt and pt's daughter Dr Durwin Reges in flouro room.   Hopefully pt's sialorrhea will improve -?  whether source is hypersalivation vs decreased management of saliva.  Question source of this sialorrhea also?   Kathleen Lime, MS [place] Office [phone]

## 2019-07-13 NOTE — Progress Notes (Signed)
Attempted to assess the patient but did not awaken.  Sleeping soundly with mouth open.  Will continue to attempt to psychiatrically assess him when he can participate.  Seth Mccall, PMHNP

## 2019-07-13 NOTE — Consult Note (Signed)
Notified by floor nurse that patient had returned to room.  WOC Nurse Consult Note: Reason for Consult: Lower extremities Wound type: N/A Pressure Injury POA: N/A Measurement:N/A Wound bed:N/A Drainage (amount, consistency, odor) None Periwound: N/A Dressing procedure/placement/frequency: Bilateral LEs with hemosiderin staining in the pretibial/gaiter area indicative of venous insufficiency.  No edema, no wounds noted.  Very dry skin on both legs and feet.  I will provide Nursing with a preventative treatment plan using twice daily soap and water cleansing following by the application of Eucerin cream applied to the affected areas but not between toes. After the flaking dry skin has been removed and patient is discharged, moisturizing daily (instead of twice daily) will suffice. I have provided a pressure redistribution chair pad for his use when OOB to chair.  [facility] nursing team will not follow, but will remain available to this patient, the nursing and medical teams.  Please re-consult if needed. Thanks, Maudie Flakes, MSN, RN, [place], Arther Abbott  Pager# [phone]

## 2019-07-13 NOTE — Progress Notes (Signed)
PROGRESS NOTE    Seth Mccall  ZOX:[id number] DOB: 1929-08-12 DOA: 07/12/2019 PCP: [name], MD   Brief Narrative: 86 year old male with history of CAD, HTN, gout, prostate cancer, arthritis requiring back fusion C3-C4-C5 presented from [facility] facility with worsening confusion.  At baseline able to ambulate with a walker but has been feeling worse since Monday for past 6 days PTA.  Apparently he seemed to have worsening of his chronic right-sided weakness, has chronic right arm weakness secondary to cord impingement and right lower extremity weakness secondary to injury in the past.  Patient has been trying to urinate more frequently with a small amount of urine produced also reporting upper extremity pain and still hypothermic down to 95 on arrival to the ER placed in a bear hugger and nursing home reported he has been declining over the past week or so.  Daughter who is a Hospital doctor reports some mental status changes for past week and seem to be disoriented.  Has received a Covid vaccine  X2 Work-up in the ER showed CT scan head no acute finding UA unremarkable, B12 TSH stable, chest x-ray clear lungs, right upper quadrant ultrasound negative.  Patient was admitted for acute encephalopathy.  Subjective:  Patient coughing and unable to cough out. Crackly sounds coming out on cough, trying to cough out Responds with "AA" unable to answer any question or follow any commands. Afebrile.  Assessment & Plan:  Acute metabolic encephalopathy:worsening confusion.  CT head unremarkable.  Exam limited as patient not following commands and confused.  MRI brain MRI C-spine has been ordered -likely low yield-not sure if he is able to tolerate- as he is not following commands- we will attempt below. TSH/Vitamin B12 and ammonia level/ influenza screen negative wonder if this is progressive dementia- but per daughter not diagnosed with dementia.Behavioral health assessment requested  per family's request, await inputs. We will cont on iv hydration gently.  SIRS: Unclear etiology could be dehydration, at this time no known infection UA and chest x-ray unremarkable.  Blood cultures urine cultures sent from the ER and antibiotics on hold.  Question viral etiology.  COVID-19 negative.  Cough/runny nose-having cough this morning.  I ordered SLP eval. we will keep NPO for now. Prn nebs, and suctioning.  Venous stasis ulcers/chronic hyperpigmented edematous leg.  Monitor supportive care.  Unna boot removed on admission.  Care on consult.  Anemia hemoglobin 10.8/thrombocytopenia platelet 100,000k.  Monitor closely while on Lovenox.  Malignant neoplasm of prostate, chronic. WJX:BJYNW pressure soft.hold off on this medication.  Monitor. Elevated LFTs: Right upper quadrant ultrasound unremarkable. Low prealbumin level.  Monitor.  Nutrition supplementations once able to take p.o.  Mildly +/Borderline Trop- 12-28 ( <90) no chest pain. likely type II/demand mismatch  Body mass index is 26.22 kg/m.   DVT prophylaxis:not mobile, mod-high risk-add lovenox Code Status: DNR/DNI. Family Communication:updated daughter after MRI and SLP eval  Disposition Plan: Patient is from:[facility] Anticipated Disposition: to ALF- in 1-2 days Barriers to discharge or conditions that needs to be met prior to discharge: Patient will need at least 2 midnight days to for ongoing further management of his acute encephalopathy.  We will change the patient to inpatient status.PT/OT and speech eval requested. Spoke with Daughter- she would like him to return ALF as he lives with his wife at ALF.    Consultants:see note  Procedures:see note  MRI Brain Suboptimal evaluation due to artifact.  No acute infarction.  MRI C spine Operative and degenerative changes as  detailed above. Suboptimal stenosis evaluation due to artifact. However, appearance is similar to the prior study. Canal stenosis remains  greatest at C3-C4 and C4-C5. Foraminal stenosis greatest at C4-C5. Stable abormal cord signal at C4 and C5 reflecting myelomalacia.  Microbiology:see note  Medications: Scheduled Meds: . aspirin EC  81 mg Oral Daily  . atorvastatin  10 mg Oral q1800  . mirabegron ER  50 mg Oral Daily   Continuous Infusions: . sodium chloride      Antimicrobials: Anti-infectives (From admission, onward)   None       Objective: Vitals: Today's Vitals   07/12/19 2254 07/13/19 0000 07/13/19 0505 07/13/19 0622  BP: (!) 103/57   (!) 102/56  Pulse: 99   100  Resp: (!) 22   (!) 22  Temp: 99 F (37.2 C)   97.9 F (36.6 C)  TempSrc: Oral   Oral  SpO2: 95%   92%  Weight: 60.9 kg     Height: 5' (1.524 m)     PainSc: 0-No pain 0-No pain 0-No pain 0-No pain    Intake/Output Summary (Last 24 hours) at 07/13/2019 0912 Last data filed at 07/13/2019 5732 Gross per 24 hour  Intake 1324.62 ml  Output 150 ml  Net 1174.62 ml   Filed Weights   07/12/19 2254  Weight: 60.9 kg   Weight change:    Intake/Output from previous day: 02/21 0701 - 02/22 0700 In: 1324.6 [I.V.:324.6; IV Piggyback:1000] Out: -  Intake/Output this shift: Total I/O In: -  Out: 150 [Urine:150]  Examination:  General exam: AAO x0, does not follow commands HEENT:Oral mucosa moist, Ear/Nose WNL grossly,dentition normal. Respiratory system: bilaterally clear, cough in upper airway with crackles,no use of accessory muscle, non tender. Cardiovascular system: S1 & S2 +, regular, No JVD. Gastrointestinal system: Abdomen soft, NT,ND, BS+. Nervous System:Alert, awake,does not follow commands and limited exam not moving leg on command, on pressing legs  Painful Extremities: chronic edematous and hyperpigmented legs Skin: No rashes,no icterus. MSK: Normal muscle bulk,tone, power  Data Reviewed: I have personally reviewed following labs and imaging studies CBC: Recent Labs  Lab 07/12/19 1819 07/13/19 0151  WBC 7.2 5.4    NEUTROABS 5.7  --   HGB 11.9* 10.8*  HCT 36.7* 33.3*  MCV 95.6 94.6  PLT 107* 202*   Basic Metabolic Panel: Recent Labs  Lab 07/12/19 1819 07/12/19 2320 07/13/19 0151  NA 141  --  143  K 4.5  --  4.4  CL 106  --  111  CO2 27  --  24  GLUCOSE 78  --  74  BUN 29*  --  27*  CREATININE 0.79  --  0.93  CALCIUM 9.3  --  8.7*  MG  --  1.9 1.7  PHOS  --  3.8 3.6   GFR: Estimated Creatinine Clearance: 43.1 mL/min (by C-G formula based on SCr of 0.93 mg/dL). Liver Function Tests: Recent Labs  Lab 07/12/19 1819 07/13/19 0151  AST 54* 47*  ALT 61* 51*  ALKPHOS 76 64  BILITOT 0.6 0.6  PROT 6.9 6.0*  ALBUMIN 3.1* 2.7*   Recent Labs  Lab 07/12/19 2320  LIPASE 38   Recent Labs  Lab 07/12/19 2320  AMMONIA 34   Coagulation Profile: Recent Labs  Lab 07/12/19 2320  INR 1.2   Cardiac Enzymes: Recent Labs  Lab 07/12/19 2320  CKTOTAL 627*   BNP (last 3 results) No results for input(s): PROBNP in the last 8760 hours. HbA1C: No results for  input(s): HGBA1C in the last 72 hours. CBG: No results for input(s): GLUCAP in the last 168 hours. Lipid Profile: No results for input(s): CHOL, HDL, LDLCALC, TRIG, CHOLHDL, LDLDIRECT in the last 72 hours. Thyroid Function Tests: Recent Labs    07/12/19 2102  TSH 3.172   Anemia Panel: Recent Labs    07/12/19 2104 07/12/19 2320 07/13/19 0151  VITAMINB12 881  --   --   FOLATE  --   --  13.9  FERRITIN  --  201  --   TIBC  --   --  248*  IRON  --   --  74  RETICCTPCT  --   --  1.7   Sepsis Labs: Recent Labs  Lab 07/12/19 1819 07/12/19 2320  LATICACIDVEN 1.1 0.8    Recent Results (from the past 240 hour(s))  Blood Culture (routine x 2)     Status: None (Preliminary result)   Collection Time: 07/12/19  6:03 PM   Specimen: BLOOD  Result Value Ref Range Status   Specimen Description   Final    BLOOD RIGHT ANTECUBITAL Performed at [facility], [place] [address] [place], [place] [address]    Special  Requests   Final    BOTTLES DRAWN AEROBIC AND ANAEROBIC Blood Culture results may not be optimal due to an excessive volume of blood received in culture bottles Performed at [facility] [address], [place], [place] [address]    Culture   Final    NO GROWTH < 12 HOURS Performed at [facility] [address], [place],  [address]    Report Status PENDING  Incomplete  Respiratory Panel by RT PCR (Flu A&B, Covid) - Nasopharyngeal Swab     Status: None   Collection Time: 07/12/19  9:48 PM   Specimen: Nasopharyngeal Swab  Result Value Ref Range Status   SARS Coronavirus 2 by RT PCR NEGATIVE NEGATIVE Final    Comment: (NOTE) SARS-CoV-2 target nucleic acids are NOT DETECTED. The SARS-CoV-2 RNA is generally detectable in upper respiratoy specimens during the acute phase of infection. The lowest concentration of SARS-CoV-2 viral copies this assay can detect is 131 copies/mL. A negative result does not preclude SARS-Cov-2 infection and should not be used as the sole basis for treatment or other patient management decisions. A negative result may occur with  improper specimen collection/handling, submission of specimen other than nasopharyngeal swab, presence of viral mutation(s) within the areas targeted by this assay, and inadequate number of viral copies (<131 copies/mL). A negative result must be combined with clinical observations, patient history, and epidemiological information. The expected result is Negative. Fact Sheet for Patients:  PinkCheek.be Fact Sheet for Healthcare Providers:  GravelBags.it This test is not yet ap proved or cleared by the [place] FDA and  has been authorized for detection and/or diagnosis of SARS-CoV-2 by FDA under an Emergency Use Authorization (EUA). This EUA will remain  in effect (meaning this test can be used) for the duration of the COVID-19 declaration  under Section 564(b)(1) of the Act, 21 U.S.C. section 360bbb-3(b)(1), unless the authorization is terminated or revoked sooner.    Influenza A by PCR NEGATIVE NEGATIVE Final   Influenza B by PCR NEGATIVE NEGATIVE Final    Comment: (NOTE) The Xpert Xpress SARS-CoV-2/FLU/RSV assay is intended as an aid in  the diagnosis of influenza from Nasopharyngeal swab specimens and  should not be used as a sole basis for treatment. Nasal washings and  aspirates  are unacceptable for Xpert Xpress SARS-CoV-2/FLU/RSV  testing. Fact Sheet for Patients: PinkCheek.be Fact Sheet for Healthcare Providers: GravelBags.it This test is not yet approved or cleared by the [place] FDA and  has been authorized for detection and/or diagnosis of SARS-CoV-2 by  FDA under an Emergency Use Authorization (EUA). This EUA will remain  in effect (meaning this test can be used) for the duration of the  Covid-19 declaration under Section 564(b)(1) of the Act, 21  U.S.C. section 360bbb-3(b)(1), unless the authorization is  terminated or revoked. Performed at [facility], [place] [address] [place], [place] [address]   Blood Culture (routine x 2)     Status: None (Preliminary result)   Collection Time: 07/12/19 11:20 PM   Specimen: BLOOD  Result Value Ref Range Status   Specimen Description BLOOD LEFT ANTECUBITAL  Final   Special Requests   Final    BOTTLES DRAWN AEROBIC ONLY Blood Culture adequate volume Performed at [facility] [address], [place], [place] [address]    Culture   Final    NO GROWTH < 12 HOURS Performed at [facility] [address], [place], [place] [address]    Report Status PENDING  Incomplete      Radiology Studies: CT Head Wo Contrast  Result Date: 07/12/2019 CLINICAL DATA:  Increasing right-sided weakness for 3 days EXAM: CT HEAD WITHOUT CONTRAST TECHNIQUE: Contiguous axial  images were obtained from the base of the skull through the vertex without intravenous contrast. COMPARISON:  05/06/2019 FINDINGS: Brain: No acute infarct or hemorrhage. Lateral ventricles and midline structures are unremarkable. Stable basal ganglia calcifications. No acute extra-axial fluid collections. No mass effect. Vascular: No hyperdense vessel or unexpected calcification. Skull: Normal. Negative for fracture or focal lesion. Sinuses/Orbits: No acute finding. Other: None IMPRESSION: 1. No acute intracranial process. Electronically Signed   By: Randa Ngo M.D.   On: 07/12/2019 19:38   DG Chest Port 1 View  Result Date: 07/12/2019 CLINICAL DATA:  Hypothermia EXAM: PORTABLE CHEST 1 VIEW COMPARISON:  08/21/2017 FINDINGS: The heart size and mediastinal contours are within normal limits. Both lungs are clear. The visualized skeletal structures are unremarkable. IMPRESSION: No active disease. Electronically Signed   By: Ulyses Jarred M.D.   On: 07/12/2019 19:01   US Abdomen Limited RUQ  Result Date: 07/13/2019 CLINICAL DATA:  Elevated liver function tests EXAM: ULTRASOUND ABDOMEN LIMITED RIGHT UPPER QUADRANT COMPARISON:  None. FINDINGS: Gallbladder: No gallstones or wall thickening visualized. No sonographic Murphy sign noted by sonographer. Common bile duct: Diameter: 6 mm.  Where visualized, no filling defect. Liver: No focal lesion identified. Within normal limits in parenchymal echogenicity. Portal vein is patent on color Doppler imaging with normal direction of blood flow towards the liver. IMPRESSION: Negative right upper quadrant ultrasound Electronically Signed   By: Monte Fantasia M.D.   On: 07/13/2019 06:33     LOS: 0 days   Time spent: More than 50% of that time was spent in counseling and/or coordination of care.  Antonieta Pert, MD [facility]  07/13/2019, 9:12 AM

## 2019-07-13 NOTE — Consult Note (Signed)
[facility] Nurse Consult Note: Reason for Consult:LE wounds  Attempted to see patient x2 today, once during his psychiatric evaluation and the second time while he was off the unit at his swallowing evaluation. Will see patient tomorrow am.  [facility] nursing team will not follow, but will remain available to this patient, the nursing and medical teams.  Please re-consult if needed. Thanks, Maudie Flakes, MSN, RN, [place], Arther Abbott  Pager# [phone]

## 2019-07-13 NOTE — Evaluation (Addendum)
Clinical/Bedside Swallow Evaluation Patient Details  Name: Seth Mccall MRN: [id number] Date of Birth: May 08, 1930  Today's Date: 07/13/2019 Time: SLP Start Time (ACUTE ONLY): 1159 SLP Stop Time (ACUTE ONLY): 1226 SLP Time Calculation (min) (ACUTE ONLY): 27 min  Past Medical History:  Past Medical History:  Diagnosis Date  . Arthritis   . Complication of anesthesia    limited movement in neck because of fusion  . Gout   . Hypertension   . Prostate cancer [facility])    Past Surgical History:  Past Surgical History:  Procedure Laterality Date  . BACK SURGERY  2005   fusion of C3, C4, and C5  . CATARACT EXTRACTION Bilateral   . CORONARY ATHERECTOMY N/A 08/12/2017   Procedure: CORONARY ATHERECTOMY;  Surgeon: Adrian Prows, MD;  Location: [place] [facility];  Service: Cardiovascular;  Laterality: N/A;  . CORONARY STENT INTERVENTION N/A 08/12/2017   Procedure: CORONARY STENT INTERVENTION;  Surgeon: Adrian Prows, MD;  Location: [place] [facility];  Service: Cardiovascular;  Laterality: N/A;  . GREEN LIGHT LASER TURP (TRANSURETHRAL RESECTION OF PROSTATE N/A 10/10/2012   Procedure: GREEN LIGHT LASER TURP (TRANSURETHRAL RESECTION OF PROSTATE;  Surgeon: Claybon Jabs, MD;  Location: [facility];  Service: Urology;  Laterality: N/A;  . LEFT HEART CATH AND CORONARY ANGIOGRAPHY N/A 08/12/2017   Procedure: LEFT HEART CATH AND CORONARY ANGIOGRAPHY;  Surgeon: Adrian Prows, MD;  Location: [place] [facility];  Service: Cardiovascular;  Laterality: N/A;  . PROSTATE BIOPSY    . 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  At his baseline able to ambulate with a walker but has been feeling worse since Monday for the past 6 days per MD note with poor intake, congested cough and worsening of his chronic right-sided weakness.  Apparently he has right arm  weakness secondary to cord impingement and right lower extremity weakness secondary to injury in the past.  Pt with poor po intake and congested cough with wet voice recently. Daughter is a [name] in town - SLP phoned her after evaluation and she reported pt with one week duration of pt's wet cough, choking on saliva causing fear to swallow.  She reports pt was moved to an ALF in November and since this time, he has lost 30 pounds.  Daughter reports he does not like the food there but believes he eats what he can tolerate.  Swallow evaluation ordered. 2006 imaging showed: Chronic changes as described status post C3-C5 instrumented fusion, functionally extending to the C5-6 disc space. Continued spinal stenosis with cord compression at C3-4 greater than C4-5 is associated with cord atrophy and gliosis. Symptomatic right-sided neural compression likely exists/persists at C3-4, C4-5, and C6-7; see discussion above.    Assessment / Plan / Recommendation Clinical Impression  Pt presents with clinical indications concerning for gross dysphagia/aspiration of even secretions at this time.  No focal CN deficits apparent except weak cough/wet voice which are concerning for possible vagal nerve involvement.  Pt is able to cough but unfortunately is unable to fully clear secretions.  After oral care provided with toothbrush, limited po provided due to aspiration concerns.  Pt demonstrates immediate throat clear and/or cough with every bolus provided.  Limited laryngeal elevation noted with palpation at bedside.  SLP called daughter and contacted MD re: potential plan.  MBS to be completed  per all parties. Daughter reports pt with wet voice, cough and fear to swallow x1 week.  Note CT head negative.  Advised RN to plan.  Note CT negative but MRI of cervical spine and brain pending. 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  At his baseline able to ambulate with a walker but has been feeling worse since Monday for the past 6 days per MD note with poor intake, congested cough and worsening of his chronic right-sided weakness.  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  Pt with poor po intake and congested cough with wet voice recently. Daughter is a [name] in town - SLP phoned her after evaluation and she reported pt with one week duration of pt's wet cough, choking on saliva causing fear to swallow.  She reports pt was moved to an ALF in November and since this time, he has lost 30 pounds.  Daughter reports he does not like the food there but believes he eats what he can tolerate.  Swallow evaluation ordered. Type of Study: Bedside Swallow Evaluation Respiratory Status: Room air History of Recent Intubation: No Behavior/Cognition: Alert;Cooperative Oral Cavity Assessment: Excessive secretions Oral Care Completed by SLP: Yes(slp brushed his teeth) Self-Feeding Abilities: Total assist Patient Positioning: Upright in bed Baseline Vocal Quality: Wet;Suspected CN X (Vagus) involvement Volitional Cough: Weak Volitional Swallow: Able to elicit(clinically limited laryngeal elevation with palpation)    Oral/Motor/Sensory Function Overall Oral Motor/Sensory Function: Generalized oral weakness   Ice Chips Ice chips: Not tested Other Comments: pt does not consume ice  chips   Thin Liquid Thin Liquid: Impaired Presentation: Spoon Oral Phase Impairments: Reduced lingual movement/coordination Oral Phase Functional Implications: Oral residue Pharyngeal  Phase Impairments: Throat Clearing - Immediate;Cough - Delayed    Nectar Thick Nectar Thick Liquid: Not tested   Honey Thick Honey Thick Liquid: Not tested   Puree Puree: Impaired Presentation: Spoon Oral Phase Impairments: Reduced lingual movement/coordination Pharyngeal Phase Impairments: Cough - Delayed   Solid     Solid: Not tested      Macario Golds 07/13/2019,1:12 PM  Kathleen Lime, MS [place] Office [phone]

## 2019-07-13 NOTE — Progress Notes (Signed)
Initial Nutrition Assessment  RD working remotely.  DOCUMENTATION CODES:   Not applicable  INTERVENTION:   -Ensure Enlive po TID, each supplement provides 350 kcal and 20 grams of protein -MVI with minerals daily -Magic cup TID with meals, each supplement provides 290 kcal and 9 grams of protein -Feeding assistance with meals  NUTRITION DIAGNOSIS:   Inadequate oral intake related to lethargy/confusion, dysphagia as evidenced by percent weight loss.  GOAL:   Patient will meet greater than or equal to 90% of their needs  MONITOR:   PO intake, Supplement acceptance, Diet advancement, Labs, Weight trends, Skin, I & O's  REASON FOR ASSESSMENT:   Consult Assessment of nutrition requirement/status  ASSESSMENT:   Seth Mccall is a 86 y.o. male with medical history significant of coronary artery disease, hypertension, gout, prostate cancer, arthritis, requiring back fusion of C3-C4-C5  Pt admitted with acute metabolic encephalopathy.   2/22- s/p BSE, MBSS- advanced to dysphagia 3 diet with thin liquids  Reviewed I/O's: +1.3 L x 24 hours  UOP: 150 ml x 24 hours  Attempted to speak with pt via phone, however, no answer.   Reviewed wt hx; noted pt has experienced a 11.6% wt loss over the past 7 months, which while not significant for time frame, is concerning due to advanced age and AMS. Also noted pt with deep pitting edema. Suspect this may be masking further weight loss as well as fat and muscle depletions.   Medications reviewed and include 0.9% sodium chloride infusion.   Labs reviewed.   Diet Order:   Diet Order            DIET DYS 3 Room service appropriate? Yes; Fluid consistency: Thin  Diet effective now              EDUCATION NEEDS:   No education needs have been identified at this time  Skin:  Skin Assessment: Reviewed RN Assessment  Last BM:  Unknown  Height:   Ht Readings from Last 1 Encounters:  07/12/19 5' (1.524 m)    Weight:    Wt Readings from Last 1 Encounters:  07/12/19 60.9 kg    Ideal Body Weight:  48.1 kg  BMI:  Body mass index is 26.22 kg/m.  Estimated Nutritional Needs:   Kcal:  1650-1850  Protein:  75-90 grams  Fluid:  > 1.6 L    Loistine Chance, RD, LDN, [place] Registered Dietitian II Certified Diabetes Care and Education Specialist Please refer to [facility] for RD and/or RD on-call/weekend/after hours pager

## 2019-07-14 DIAGNOSIS — Z515 Encounter for palliative care: Secondary | ICD-10-CM

## 2019-07-14 MED ORDER — SODIUM CHLORIDE 0.9 % IV SOLN: INTRAVENOUS | Status: DC

## 2019-07-14 NOTE — Consult Note (Signed)
Palliative Care Consult Note Reason for Consult: Possible Hospice Referral  Seth Mccall is an 86 yo [place]e([place]) man with CAD managed medically, hx of C-Spine Cord compression with fusion and prostate cancer s/p TURP and radiation now stable, admitted from ALF with AMS, Dehydration. The patient recently moved to [facility] assisted living (a few months ago) in [place] with his wife after he had increasing mobility issues related to severe C-Spine disease causing gait problems and moderate to severe scoliosis. His daughter is a geriatrician and provided the history. I discussed his current medical condition in detail with her as well as his goals of care. Seth Mccall does not have a diagnosis of dementia and despite declining physical health and mobility, he was decisional up until about 6 days ago when he began having confusion and severe lethargy at his ALF.  His current admission is for acute delirium, however there is not a clear or completely obvious etiology presently for why this was such a rapid decline- even after IV hydration he remains very lethargic and mildly confused. He is not eating, no documented bowel movements and reported issues with urination (incomplete voiding) early in his hospital course. I discussed the medical differential diagnoses with his daughter in order to determine if there were additional studies needed to determine why he has such profound failure to thrive and delirium.  His Current Issues Medically:  Weight loss of >30lbs in past 3 months, poor appetite "does not like the food at ALF".  Albumin is 2.7, Prealbumin 14.1, some peripheral edema present  Severe scoliosis and mobility issues, significantly impacting QOL, receiving PT regularly  Urinary incontinence (avoids drinking fluids due to slow mobility in getting to a bathroom) UA does not suggest infection.  Elevated Troponin EKG without ischemic changes, possible demand ischemia with  dehydration  Thrombocytopenia, unexplained  Anemia (symptomatic?) unexplained  Mildly abnormal LFTs (RUQ [place] normal)  Delirium AMS unexplained, head CT normal, MRI limited study  Probable constipation, no stool documented since admission  Assessment: Seth Mccall has profound subacute onset failure to thrive but to date no clear underlying etiology-I discussed with his daughter pursuing additional diagnostic non-invasive imaging to continue to find a supporting medical underlying cause for his deterioration- there is value in knowing especially for prognostic dependent services like hospice care, but she has very clear goals established that QOL is most important and she would not want him undergo any invasive procedures or diagnostics.  I am concerned about an occult malignancy especially involving the GI tract given his symptomatic anemia, thrombocytopenia, weight loss, poor appetite and believe a CT with oral contrast would be helpful. I also suspect constipation may be playing a role. If no malignancy this could be related to acute onset major depression episode with adjustment disorder related to the loss of his independence and recent move into assisted living. He has no previous mental health history, but may be showing atypical signs of depression in his lethargy and poor appetite.  Recommendations: 1. Obtain CT of abdomen pelvis with oral contrast, r/o malignancy to best degre possible, no colonoscopy due to invasive nature and discomfort or prep. Check Hemoccult.  2. Monitor Strict I/O including urine output and stools  3. If no organic cause of unintentional weight loss identified, start Mirtazapine 15mg  qhs for depression and appetite stimulation.  4. Patient has MOST form, needs to be scanned into Vynca, DNR, no feeding tube.  5. No SNF, patient will not do well in this setting and is  not going to successfully rehab in his current condition, he would benefit from ongoing  mobility and safety related therapy for stabilization and QOL. Back to ALF when medically stable.  6. Patient may be hospice eligible due to his failure to thrive related to immobility from C-Spine Disease and MSK disorder late effects, but prognostication is difficult in this current setting without clear terminal diagnosis.Patients daughter has spoken with Immunologist and discussed this case-may go palliative first , but she is open to hospice if he qualifies.  Lane Hacker, DO Palliative Medicine   Time: 70 minutes Greater than 50%  of this time was spent counseling and coordinating care related to the above assessment and plan.

## 2019-07-14 NOTE — Plan of Care (Signed)
  Problem: Elimination: Goal: Will not experience complications related to bowel motility Outcome: Progressing Goal: Will not experience complications related to urinary retention Outcome: Progressing   Problem: Pain Managment: Goal: General experience of comfort will improve Outcome: Progressing   

## 2019-07-14 NOTE — Evaluation (Signed)
Occupational Therapy Evaluation Patient Details Name: Seth Mccall MRN: [id number] DOB: 1930/01/25 Today's Date: 07/14/2019    History of Present Illness 86 yo male admitted with SIRS, AMS. Hx of CAD, gout, OA, prostate Ca, cervical fusion. Pt is from [facility]   Clinical Impression   Pt previously at [facility]F, using RW for mobility and getting assist for bathing/dressing. Pt today is total/max A +2 for bed mobility. While sitting EOB Pt with R lateral lean impacting sitting balance and ability to participate in grooming activities. Pt max A for LB ADL, and will require +2 assist for transfers, recommend STEDY for RN staff at this time. OT will continue to follow acutely and next session to focus on sitting balance at EOB and focus on functional ADL during balance tasks (preferably in the recliner OOB)    Follow Up Recommendations  Home health OT;Supervision/Assistance - 24 hour;SNF(ALF preferred if they can provide support)    Equipment Recommendations  Other (comment)(defer to next venue)    Recommendations for Other Services PT consult     Precautions / Restrictions Precautions Precautions: Fall Restrictions Weight Bearing Restrictions: No      Mobility Bed Mobility Overal bed mobility: Needs Assistance Bed Mobility: Supine to Sit;Sit to Supine     Supine to sit: Total assist;+2 for physical assistance;+2 for safety/equipment;HOB elevated Sit to supine: Total assist;+2 for physical assistance;+2 for safety/equipment;HOB elevated   General bed mobility comments: Assist for trunk and bil LEs. Utilized bedpad for scooting, positioning.  Transfers                 General transfer comment: NT this session, focus on sitting balance    Balance Overall balance assessment: Needs assistance Sitting-balance support: Bilateral upper extremity supported;Feet supported Sitting balance-Leahy Scale: Poor Sitting balance - Comments: Leaning to R side,  especially without bil UE support.                                   ADL either performed or assessed with clinical judgement   ADL Overall ADL's : Needs assistance/impaired Eating/Feeding: Minimal assistance;Sitting   Grooming: Minimal assistance   Upper Body Bathing: Moderate assistance   Lower Body Bathing: Maximal assistance   Upper Body Dressing : Maximal assistance   Lower Body Dressing: Total assistance   Toilet Transfer: Maximal assistance;Total assistance;+2 for physical assistance;+2 for safety/equipment;Stand-pivot   Toileting- Clothing Manipulation and Hygiene: Total assistance       Functional mobility during ADLs: Maximal assistance;+2 for physical assistance;+2 for safety/equipment;Rolling walker(recommend transfers with stedy for RN staff)       Vision Baseline Vision/History: Wears glasses       Perception     Praxis      Pertinent Vitals/Pain Pain Assessment: No/denies pain     Hand Dominance Right   Extremity/Trunk Assessment Upper Extremity Assessment Upper Extremity Assessment: Generalized weakness   Lower Extremity Assessment Lower Extremity Assessment: Defer to PT evaluation   Cervical / Trunk Assessment Cervical / Trunk Assessment: Kyphotic   Communication Communication Communication: No difficulties   Cognition Arousal/Alertness: Awake/alert Behavior During Therapy: WFL for tasks assessed/performed Overall Cognitive Status: No family/caregiver present to determine baseline cognitive functioning                                     General Comments  Exercises     Shoulder Instructions      Home Living Family/patient expects to be discharged to:: Unsure     Type of Home: Assisted living([facility]) Home Access: Level entry     Home Layout: One level               Home Equipment: Walker - 4 wheels          Prior Functioning/Environment Level of Independence: Needs  assistance  Gait / Transfers Assistance Needed: uses walker for ambulation ADL's / Homemaking Assistance Needed: assist from staff for bathing/dressing   Comments: uses walker for ambulation        OT Problem List: Decreased strength;Decreased activity tolerance;Impaired balance (sitting and/or standing);Decreased cognition;Decreased safety awareness      OT Treatment/Interventions: Self-care/ADL training;DME and/or AE instruction;Therapeutic activities;Patient/family education;Balance training    OT Goals(Current goals can be found in the care plan section) Acute Rehab OT Goals Patient Stated Goal: family would like pt to return to ALF OT Goal Formulation: With family Time For Goal Achievement: 07/28/19 Potential to Achieve Goals: Fair ADL Goals Pt Will Perform Grooming: with supervision;sitting Pt Will Perform Upper Body Bathing: with supervision;sitting Pt Will Perform Upper Body Dressing: with supervision;sitting Pt Will Transfer to Toilet: with mod assist;stand pivot transfer;bedside commode Pt Will Perform Toileting - Clothing Manipulation and hygiene: with mod assist;sitting/lateral leans Additional ADL Goal #1: Pt will perform bed mobility at mod A level prior to engaging in ADL  OT Frequency: Min 2X/week   Barriers to D/C:    ALF needs to be able to provide transfer support       Co-evaluation              AM-PAC OT "6 Clicks" Daily Activity     Outcome Measure Help from another person eating meals?: A Little Help from another person taking care of personal grooming?: A Little Help from another person toileting, which includes using toliet, bedpan, or urinal?: A Lot Help from another person bathing (including washing, rinsing, drying)?: A Lot Help from another person to put on and taking off regular upper body clothing?: A Lot Help from another person to put on and taking off regular lower body clothing?: Total 6 Click Score: 13   End of Session Nurse  Communication: Mobility status;Other (comment)(recommend use of STEDY for RN staff)  Activity Tolerance: Patient tolerated treatment well Patient left: in bed;with call bell/phone within reach;with bed alarm set  OT Visit Diagnosis: Unsteadiness on feet (R26.81);Other abnormalities of gait and mobility (R26.89);Muscle weakness (generalized) (M62.81);Other symptoms and signs involving cognitive function                Time: 1203-1223 OT Time Calculation (min): 20 min Charges:  OT General Charges $OT Visit: 1 Visit OT Evaluation $OT Eval Moderate Complexity: [place] OTR/L Acute Rehabilitation Services Pager: [phone] Office: [place] 07/14/2019, 1:08 PM

## 2019-07-14 NOTE — Consult Note (Signed)
Telepsych Consultation   Reason for Consult:  " Delirium, paranoia" Referring Physician:  Dr Maren Beach Location of Patient: [facility] Location of Provider: [facility]  Patient Identification: Seth Mccall MRN:  [id number] Principal Diagnosis: <principal problem not specified> Diagnosis:  Active Problems:   Malignant neoplasm of prostate ([place])   SIRS (systemic inflammatory response syndrome) ([place])   Acute metabolic encephalopathy   Elevated LFTs   Acute delirium   Acute encephalopathy   Total Time spent with patient: 30 minutes  Subjective:   Seth Mccall is a 86 y.o. male patient.  Patient assessed by nurse practitioner.  Patient alert and oriented, answers appropriately.  Patient conversation appropriate answers orientation questions appropriately. Patient denies suicidal and homicidal ideations.  Patient denies auditory and visual hallucinations, denies symptoms of paranoia. Patient denies access to weapons. Patient denies history of mental illness. Patient reports average appetite and average sleep. Patient gives verbal consent to speak with daughter, Seth Mccall [phone] Patient's daughter, Seth Mccall, denies any psychiatric history for patient. Daughter reports patient more capable until "last Thursday." Daughter reports patient chronically reluctant to take fluids PO related to urinary incontinence. Patient's daughter denies patient has access to weapons.    HPI:  Patient admitted with worsening confusion x 6 days.   Past Psychiatric History: Denies  Risk to Self:  Denies Risk to Others:  Denies Prior Inpatient Therapy:  Denies Prior Outpatient Therapy:  Denies  Past Medical History:  Past Medical History:  Diagnosis Date  . Arthritis   . Complication of anesthesia    limited movement in neck because of fusion  . Gout   . Hypertension   . Prostate cancer [facility]     Past Surgical History:  Procedure Laterality Date  .  BACK SURGERY  2005   fusion of C3, C4, and C5  . CATARACT EXTRACTION Bilateral   . CORONARY ATHERECTOMY N/A 08/12/2017   Procedure: CORONARY ATHERECTOMY;  Surgeon: Adrian Prows, MD;  Location: [facility];  Service: Cardiovascular;  Laterality: N/A;  . CORONARY STENT INTERVENTION N/A 08/12/2017   Procedure: CORONARY STENT INTERVENTION;  Surgeon: Adrian Prows, MD;  Location: [facility];  Service: Cardiovascular;  Laterality: N/A;  . GREEN LIGHT LASER TURP (TRANSURETHRAL RESECTION OF PROSTATE N/A 10/10/2012   Procedure: GREEN LIGHT LASER TURP (TRANSURETHRAL RESECTION OF PROSTATE;  Surgeon: Claybon Jabs, MD;  Location: [facility];  Service: Urology;  Laterality: N/A;  . LEFT HEART CATH AND CORONARY ANGIOGRAPHY N/A 08/12/2017   Procedure: LEFT HEART CATH AND CORONARY ANGIOGRAPHY;  Surgeon: Adrian Prows, MD;  Location: [facility];  Service: Cardiovascular;  Laterality: N/A;  . PROSTATE BIOPSY    . TRANSURETHRAL RESECTION OF PROSTATE  10-10-2012   Family History:  Family History  Problem Relation Age of Onset  . Hypertension Other    Family Psychiatric  History: Denies Social History:  Social History   Substance and Sexual Activity  Alcohol Use Yes   Comment: occasional     Social History   Substance and Sexual Activity  Drug Use No    Social History   Socioeconomic History  . Marital status: Married    Spouse name: Not on file  . Number of children: 4  . Years of education: Not on file  . Highest education level: Not on file  Occupational History  . Not on file  Tobacco Use  . Smoking status: Former Smoker    Packs/day: 0.50    Years: 4.00  Pack years: 2.00    Types: Cigarettes    Quit date: 05/21/1968    Years since quitting: 51.1  . Smokeless tobacco: Never Used  Substance and Sexual Activity  . Alcohol use: Yes    Comment: occasional  . Drug use: No  . Sexual activity: Not on file  Other Topics Concern  . Not on file  Social History Narrative  . Not  on file   Social Determinants of Health   Financial Resource Strain:   . Difficulty of Paying Living Expenses: Not on file  Food Insecurity:   . Worried About Charity fundraiser in the Last Year: Not on file  . Ran Out of Food in the Last Year: Not on file  Transportation Needs:   . Lack of Transportation (Medical): Not on file  . Lack of Transportation (Non-Medical): Not on file  Physical Activity:   . Days of Exercise per Week: Not on file  . Minutes of Exercise per Session: Not on file  Stress:   . Feeling of Stress : Not on file  Social Connections:   . Frequency of Communication with Friends and Family: Not on file  . Frequency of Social Gatherings with Friends and Family: Not on file  . Attends Religious Services: Not on file  . Active Member of Clubs or Organizations: Not on file  . Attends Archivist Meetings: Not on file  . Marital Status: Not on file   Additional Social History:    Allergies:   Allergies  Allergen Reactions  . Pravastatin     Labs:  Results for orders placed or performed during the hospital encounter of 07/12/19 (from the past 48 hour(s))  Blood Culture (routine x 2)     Status: None (Preliminary result)   Collection Time: 07/12/19  6:03 PM   Specimen: BLOOD  Result Value Ref Range   Specimen Description      BLOOD RIGHT ANTECUBITAL Performed at [facility], [place] [address] [place], [place] [address]    Special Requests      BOTTLES DRAWN AEROBIC AND ANAEROBIC Blood Culture results may not be optimal due to an excessive volume of blood received in culture bottles Performed at [facility] [address] [place], [place] [address]    Culture      NO GROWTH 2 DAYS Performed at [facility], [place] [address], [place], [place] [address]    Report Status PENDING   Lactic acid, plasma     Status: None   Collection Time: 07/12/19  6:19 PM  Result Value Ref Range   Lactic Acid, Venous 1.1 0.5 - 1.9  mmol/L    Comment: Performed at [facility], [place] [address], [place], [place] [address]  Comprehensive metabolic panel     Status: Abnormal   Collection Time: 07/12/19  6:19 PM  Result Value Ref Range   Sodium 141 135 - 145 mmol/L   Potassium 4.5 3.5 - 5.1 mmol/L   Chloride 106 98 - 111 mmol/L   CO2 27 22 - 32 mmol/L   Glucose, Bld 78 70 - 99 mg/dL   BUN 29 (H) 8 - 23 mg/dL   Creatinine, Ser 0.79 0.61 - 1.24 mg/dL   Calcium 9.3 8.9 - 10.3 mg/dL   Total Protein 6.9 6.5 - 8.1 g/dL   Albumin 3.1 (L) 3.5 - 5.0 g/dL   AST 54 (H) 15 - 41 U/L   ALT 61 (H) 0 -  44 U/L   Alkaline Phosphatase 76 38 - 126 U/L   Total Bilirubin 0.6 0.3 - 1.2 mg/dL   GFR calc non Af Amer >60 >60 mL/min   GFR calc Af Amer >60 >60 mL/min   Anion gap 8 5 - 15    Comment: Performed at [facility], [place] [address] [place], [place] [address]  CBC WITH DIFFERENTIAL     Status: Abnormal   Collection Time: 07/12/19  6:19 PM  Result Value Ref Range   WBC 7.2 4.0 - 10.5 K/uL   RBC 3.84 (L) 4.22 - 5.81 MIL/uL   Hemoglobin 11.9 (L) 13.0 - 17.0 g/dL   HCT 36.7 (L) 39.0 - 52.0 %   MCV 95.6 80.0 - 100.0 fL   MCH 31.0 26.0 - 34.0 pg   MCHC 32.4 30.0 - 36.0 g/dL   RDW 15.5 11.5 - 15.5 %   Platelets 107 (L) 150 - 400 K/uL    Comment: REPEATED TO VERIFY PLATELET COUNT CONFIRMED BY SMEAR SPECIMEN CHECKED FOR CLOTS Immature Platelet Fraction may be clinically indicated, consider ordering this additional test [id number]    nRBC 0.0 0.0 - 0.2 %   Neutrophils Relative % 79 %   Neutro Abs 5.7 1.7 - 7.7 K/uL   Lymphocytes Relative 12 %   Lymphs Abs 0.9 0.7 - 4.0 K/uL   Monocytes Relative 8 %   Monocytes Absolute 0.5 0.1 - 1.0 K/uL   Eosinophils Relative 1 %   Eosinophils Absolute 0.1 0.0 - 0.5 K/uL   Basophils Relative 0 %   Basophils Absolute 0.0 0.0 - 0.1 K/uL   Immature Granulocytes 0 %   Abs Immature Granulocytes 0.02 0.00 - 0.07 K/uL    Comment: Performed at [facility], [place] [address], [place], [place] [address]  POC SARS Coronavirus 2 Ag-ED - Nasal Swab (BD Veritor Kit)     Status: None   Collection Time: 07/12/19  7:15 PM  Result Value Ref Range   SARS Coronavirus 2 Ag NEGATIVE NEGATIVE    Comment: (NOTE) SARS-CoV-2 antigen NOT DETECTED.  Negative results are presumptive.  Negative results do not preclude SARS-CoV-2 infection and should not be used as the sole basis for treatment or other patient management decisions, including infection  control decisions, particularly in the presence of clinical signs and  symptoms consistent with COVID-19, or in those who have been in contact with the virus.  Negative results must be combined with clinical observations, patient history, and epidemiological information. The expected result is Negative. Fact Sheet for Patients: PodPark.tn Fact Sheet for Healthcare Providers: GiftContent.is This test is not yet approved or cleared by the [place] FDA and  has been authorized for detection and/or diagnosis of SARS-CoV-2 by FDA under an Emergency Use Authorization (EUA).  This EUA will remain in effect (meaning this test can be used) for the duration of  the COVID-19 de claration under Section 564(b)(1) of the Act, 21 U.S.C. section 360bbb-3(b)(1), unless the authorization is terminated or revoked sooner.   Urinalysis, Routine w reflex microscopic     Status: Abnormal   Collection Time: 07/12/19  8:09 PM  Result Value Ref Range   Color, Urine YELLOW YELLOW   APPearance CLEAR CLEAR   Specific Gravity, Urine 1.018 1.005 - 1.030   pH 5.0 5.0 - 8.0   Glucose, UA NEGATIVE NEGATIVE mg/dL   Hgb urine dipstick NEGATIVE NEGATIVE   Bilirubin Urine NEGATIVE NEGATIVE   Ketones, ur NEGATIVE NEGATIVE mg/dL  Protein, ur 30 (A) NEGATIVE mg/dL   Nitrite NEGATIVE NEGATIVE   Leukocytes,Ua NEGATIVE NEGATIVE   RBC / HPF 0-5 0 - 5 RBC/hpf   WBC,  UA 0-5 0 - 5 WBC/hpf   Bacteria, UA NONE SEEN NONE SEEN   Mucus PRESENT    Hyaline Casts, UA PRESENT     Comment: Performed at [facility], [place] [address], [place], [place] [address]  Urine culture     Status: None   Collection Time: 07/12/19  8:09 PM   Specimen: In/Out Cath Urine  Result Value Ref Range   Specimen Description      IN/OUT CATH URINE Performed at [facility], [place] [address], [place], [place] [address]    Special Requests      NONE Performed at [facility], [place] [address], [place], [place] [address]    Culture      NO GROWTH Performed at [facility], [place] [address] [place], [place] [address]    Report Status 07/13/2019 FINAL   TSH     Status: None   Collection Time: 07/12/19  9:02 PM  Result Value Ref Range   TSH 3.172 0.350 - 4.500 uIU/mL    Comment: Performed by a 3rd Generation assay with a functional sensitivity of <=0.01 uIU/mL. Performed at [facility], [place] [address], [place], [place] [address]   Vitamin B12     Status: None   Collection Time: 07/12/19  9:04 PM  Result Value Ref Range   Vitamin B-12 881 180 - 914 pg/mL    Comment: (NOTE) This assay is not validated for testing neonatal or myeloproliferative syndrome specimens for Vitamin B12 levels. Performed at [facility], [place] [address], [place], [place] [address]   Respiratory Panel by RT PCR (Flu A&B, Covid) - Nasopharyngeal Swab     Status: None   Collection Time: 07/12/19  9:48 PM   Specimen: Nasopharyngeal Swab  Result Value Ref Range   SARS Coronavirus 2 by RT PCR NEGATIVE NEGATIVE    Comment: (NOTE) SARS-CoV-2 target nucleic acids are NOT DETECTED. The SARS-CoV-2 RNA is generally detectable in upper respiratoy specimens during the acute phase of infection. The lowest concentration of SARS-CoV-2 viral copies this assay can detect is 131 copies/mL. A negative result does not  preclude SARS-Cov-2 infection and should not be used as the sole basis for treatment or other patient management decisions. A negative result may occur with  improper specimen collection/handling, submission of specimen other than nasopharyngeal swab, presence of viral mutation(s) within the areas targeted by this assay, and inadequate number of viral copies (<131 copies/mL). A negative result must be combined with clinical observations, patient history, and epidemiological information. The expected result is Negative. Fact Sheet for Patients:  PinkCheek.be Fact Sheet for Healthcare Providers:  GravelBags.it This test is not yet ap proved or cleared by the [place] FDA and  has been authorized for detection and/or diagnosis of SARS-CoV-2 by FDA under an Emergency Use Authorization (EUA). This EUA will remain  in effect (meaning this test can be used) for the duration of the COVID-19 declaration under Section 564(b)(1) of the Act, 21 U.S.C. section 360bbb-3(b)(1), unless the authorization is terminated or revoked sooner.    Influenza A by PCR NEGATIVE NEGATIVE   Influenza B by PCR NEGATIVE NEGATIVE    Comment: (NOTE) The Xpert Xpress SARS-CoV-2/FLU/RSV assay is intended as an aid in  the diagnosis of influenza from Nasopharyngeal  swab specimens and  should not be used as a sole basis for treatment. Nasal washings and  aspirates are unacceptable for Xpert Xpress SARS-CoV-2/FLU/RSV  testing. Fact Sheet for Patients: PinkCheek.be Fact Sheet for Healthcare Providers: GravelBags.it This test is not yet approved or cleared by the [place] FDA and  has been authorized for detection and/or diagnosis of SARS-CoV-2 by  FDA under an Emergency Use Authorization (EUA). This EUA will remain  in effect (meaning this test can be used) for the duration of the  Covid-19  declaration under Section 564(b)(1) of the Act, 21  U.S.C. section 360bbb-3(b)(1), unless the authorization is  terminated or revoked. Performed at [facility], [place] [address], [place], [place] [address]   Lactic acid, plasma     Status: None   Collection Time: 07/12/19 11:20 PM  Result Value Ref Range   Lactic Acid, Venous 0.8 0.5 - 1.9 mmol/L    Comment: Performed at [facility], [place] [address], [place], [place] [address]  Blood Culture (routine x 2)     Status: None (Preliminary result)   Collection Time: 07/12/19 11:20 PM   Specimen: BLOOD  Result Value Ref Range   Specimen Description BLOOD LEFT ANTECUBITAL    Special Requests      BOTTLES DRAWN AEROBIC ONLY Blood Culture adequate volume Performed at [place] [address], [place], [place] [address]    Culture      NO GROWTH 1 DAY Performed at [facility] [address] [place], [place] [address]    Report Status PENDING   CK     Status: Abnormal   Collection Time: 07/12/19 11:20 PM  Result Value Ref Range   Total CK 627 (H) 49 - 397 U/L    Comment: Performed at [facility], [place] [address] [place], [place] [address]  Protime-INR     Status: None   Collection Time: 07/12/19 11:20 PM  Result Value Ref Range   Prothrombin Time 14.7 11.4 - 15.2 seconds   INR 1.2 0.8 - 1.2    Comment: (NOTE) INR goal varies based on device and disease states. Performed at [facility], [place] [address] [place], [place] [address]   Ammonia     Status: None   Collection Time: 07/12/19 11:20 PM  Result Value Ref Range   Ammonia 34 9 - 35 umol/L    Comment: Performed at [facility], [place] [address] [place], [place] [address]  Lipase, blood     Status: None   Collection Time: 07/12/19 11:20 PM  Result Value Ref Range   Lipase 38 11 - 51 U/L    Comment: Performed at [facility], [place]  [address], [place], [place] [address]  Magnesium     Status: None   Collection Time: 07/12/19 11:20 PM  Result Value Ref Range   Magnesium 1.9 1.7 - 2.4 mg/dL    Comment: Performed at [facility], [place] [address], [place], [place] [address]  Phosphorus     Status: None   Collection Time: 07/12/19 11:20 PM  Result Value Ref Range   Phosphorus 3.8 2.5 - 4.6 mg/dL    Comment: Performed at [facility], [place] [address], [place], [place] [address]  Hepatitis panel, acute     Status: Abnormal   Collection Time: 07/12/19 11:20 PM  Result Value Ref Range   Hepatitis B Surface Ag Negative (A) NON  REACTIVE    Comment: Performed at [facility]   HCV Ab <0.1 (A) 0.0 - 0.9 s/co ratio    Comment: (NOTE)                                  Negative:     < 0.8                             Indeterminate: 0.8 - 0.9                                  Positive:     > 0.9 The CDC recommends that a positive HCV antibody result be followed up with a HCV Nucleic Acid Amplification test (917915). Performed At: [facility] [place], [place] [id number] Rush Farmer MD [id number]    Hep A IgM Negative (A) Negative   Hep B C IgM Negative (A) NON REACTIVE    Comment: Performed at [facility] Performed at [facility], [place] [address], [place], [place] [address]   Lactate dehydrogenase     Status: Abnormal   Collection Time: 07/12/19 11:20 PM  Result Value Ref Range   LDH 231 (H) 98 - 192 U/L    Comment: Performed at [facility], [place] [address], [place], [place] [address]  C-reactive protein     Status: Abnormal   Collection Time: 07/12/19 11:20 PM  Result Value Ref Range   CRP 4.8 (H) <1.0 mg/dL    Comment: Performed at [facility], [place] [address] [place], [place] [address]  Ferritin     Status: None   Collection Time: 07/12/19 11:20 PM  Result Value Ref Range   Ferritin 201 24 - 336  ng/mL    Comment: Performed at [facility], [place] [address] [place], [place] [address]  D-dimer, quantitative (not at [facility])     Status: Abnormal   Collection Time: 07/12/19 11:20 PM  Result Value Ref Range   D-Dimer, Quant 0.85 (H) 0.00 - 0.50 ug/mL-FEU    Comment: (NOTE) At the manufacturer cut-off of 0.50 ug/mL FEU, this assay has been documented to exclude PE with a sensitivity and negative predictive value of 97 to 99%.  At this time, this assay has not been approved by the FDA to exclude DVT/VTE. Results should be correlated with clinical presentation. Performed at [facility], [place] [address] [place], [place] [address]   Fibrinogen     Status: Abnormal   Collection Time: 07/12/19 11:20 PM  Result Value Ref Range   Fibrinogen 501 (H) 210 - 475 mg/dL    Comment: Performed at [facility], [place] [address] [place], [place] [address]  Prealbumin     Status: Abnormal   Collection Time: 07/12/19 11:20 PM  Result Value Ref Range   Prealbumin 14.1 (L) 18 - 38 mg/dL    Comment: Performed at [facility], [place] [address] [place], [place] [address]  Troponin I (High Sensitivity)     Status: None   Collection Time: 07/12/19 11:20 PM  Result Value Ref Range   Troponin I (High Sensitivity) 12 <18 ng/L    Comment: (NOTE) Elevated high sensitivity troponin I (hsTnI) values and significant  changes across serial measurements may suggest ACS  but many other  chronic and acute conditions are known to elevate hsTnI results.  Refer to the "Links" section for chest pain algorithms and additional  guidance. Performed at [facility], [place] [address], [place], [place] [address]   Folate     Status: None   Collection Time: 07/13/19  1:51 AM  Result Value Ref Range   Folate 13.9 >5.9 ng/mL    Comment: Performed at [facility], [place] [address] [place], [place] [address]  Iron and  TIBC     Status: Abnormal   Collection Time: 07/13/19  1:51 AM  Result Value Ref Range   Iron 74 45 - 182 ug/dL   TIBC 248 (L) 250 - 450 ug/dL   Saturation Ratios 30 17.9 - 39.5 %   UIBC 174 ug/dL    Comment: Performed at [facility], [place] [address] [place], [place] [address]  Reticulocytes     Status: Abnormal   Collection Time: 07/13/19  1:51 AM  Result Value Ref Range   Retic Ct Pct 1.7 0.4 - 3.1 %   RBC. 3.47 (L) 4.22 - 5.81 MIL/uL   Retic Count, Absolute 57.6 19.0 - 186.0 K/uL   Immature Retic Fract 16.4 (H) 2.3 - 15.9 %    Comment: Performed at [facility], [place] [address] [place], [place] [address]  Magnesium     Status: None   Collection Time: 07/13/19  1:51 AM  Result Value Ref Range   Magnesium 1.7 1.7 - 2.4 mg/dL    Comment: Performed at [facility], [place] [address], [place], [place] [address]  Phosphorus     Status: None   Collection Time: 07/13/19  1:51 AM  Result Value Ref Range   Phosphorus 3.6 2.5 - 4.6 mg/dL    Comment: Performed at [facility], [place] [address] [place], [place] [address]  Comprehensive metabolic panel     Status: Abnormal   Collection Time: 07/13/19  1:51 AM  Result Value Ref Range   Sodium 143 135 - 145 mmol/L   Potassium 4.4 3.5 - 5.1 mmol/L   Chloride 111 98 - 111 mmol/L   CO2 24 22 - 32 mmol/L   Glucose, Bld 74 70 - 99 mg/dL   BUN 27 (H) 8 - 23 mg/dL   Creatinine, Ser 0.93 0.61 - 1.24 mg/dL   Calcium 8.7 (L) 8.9 - 10.3 mg/dL   Total Protein 6.0 (L) 6.5 - 8.1 g/dL   Albumin 2.7 (L) 3.5 - 5.0 g/dL   AST 47 (H) 15 - 41 U/L   ALT 51 (H) 0 - 44 U/L   Alkaline Phosphatase 64 38 - 126 U/L   Total Bilirubin 0.6 0.3 - 1.2 mg/dL   GFR calc non Af Amer >60 >60 mL/min   GFR calc Af Amer >60 >60 mL/min   Anion gap 8 5 - 15    Comment: Performed at [facility], [place] [address], [place],  [address]  CBC     Status: Abnormal   Collection Time:  07/13/19  1:51 AM  Result Value Ref Range   WBC 5.4 4.0 - 10.5 K/uL   RBC 3.52 (L) 4.22 - 5.81 MIL/uL   Hemoglobin 10.8 (L) 13.0 - 17.0 g/dL   HCT 33.3 (L) 39.0 - 52.0 %   MCV 94.6 80.0 - 100.0 fL   MCH 30.7 26.0 - 34.0 pg   MCHC 32.4 30.0 - 36.0 g/dL  RDW 15.4 11.5 - 15.5 %   Platelets 100 (L) 150 - 400 K/uL    Comment: REPEATED TO VERIFY Immature Platelet Fraction may be clinically indicated, consider ordering this additional test [id number] CONSISTENT WITH PREVIOUS RESULT    nRBC 0.0 0.0 - 0.2 %    Comment: Performed at [facility], [place] [address] [place], [place] [address]  Troponin I (High Sensitivity)     Status: Abnormal   Collection Time: 07/13/19  1:51 AM  Result Value Ref Range   Troponin I (High Sensitivity) 28 (H) <18 ng/L    Comment: (NOTE) Elevated high sensitivity troponin I (hsTnI) values and significant  changes across serial measurements may suggest ACS but many other  chronic and acute conditions are known to elevate hsTnI results.  Refer to the "Links" section for chest pain algorithms and additional  guidance. Performed at [facility], [place] [address] [place], [place] [address]     Medications:  Current Facility-Administered Medications  Medication Dose Route Frequency Provider Last Rate Last Admin  . 0.9 %  sodium chloride infusion   Intravenous Continuous Kc, Ramesh, MD      . acetaminophen (TYLENOL) tablet 650 mg  650 mg Oral Q6H PRN Doutova, Anastassia, MD       Or  . acetaminophen (TYLENOL) suppository 650 mg  650 mg Rectal Q6H PRN Doutova, Anastassia, MD      . aspirin EC tablet 81 mg  81 mg Oral Daily Doutova, Anastassia, MD   81 mg at 07/13/19 1055  . atorvastatin (LIPITOR) tablet 10 mg  10 mg Oral q1800 Toy Baker, MD   10 mg at 07/13/19 1847  . enoxaparin (LOVENOX) injection 40 mg  40 mg Subcutaneous Q24H Kc, Ramesh, MD   40 mg at 07/13/19 1055  . feeding supplement (ENSURE ENLIVE) (ENSURE  ENLIVE) liquid 237 mL  237 mL Oral TID BM Kc, Ramesh, MD   237 mL at 07/14/19 1323  . hydrocerin (EUCERIN) cream   Topical BID Antonieta Pert, MD   Given at 07/14/19 1323  . HYDROcodone-acetaminophen (NORCO/VICODIN) 5-325 MG per tablet 1-2 tablet  1-2 tablet Oral Q4H PRN Doutova, Anastassia, MD      . mirabegron ER (MYRBETRIQ) tablet 50 mg  50 mg Oral Daily Doutova, Anastassia, MD   50 mg at 07/13/19 1130  . multivitamin with minerals tablet 1 tablet  1 tablet Oral Daily Antonieta Pert, MD   1 tablet at 07/13/19 1848  . ondansetron (ZOFRAN) tablet 4 mg  4 mg Oral Q6H PRN Doutova, Anastassia, MD       Or  . ondansetron (ZOFRAN) injection 4 mg  4 mg Intravenous Q6H PRN Toy Baker, MD        Musculoskeletal: Strength & Muscle Tone: unable to assess Gait & Station: unable to assess Patient leans: N/A  Psychiatric Specialty Exam: Physical Exam  Nursing note and vitals reviewed. Constitutional: He is oriented to person, place, and time. He appears well-developed.  HENT:  Head: Normocephalic.  Cardiovascular: Normal rate.  Respiratory: Effort normal.  Neurological: He is alert and oriented to person, place, and time.    Review of Systems  Blood pressure (!) 148/87, pulse 74, temperature 97.8 F (36.6 C), temperature source Oral, resp. rate 18, height 5' (1.524 m), weight 60.9 kg, SpO2 98 %.Body mass index is 26.22 kg/m.  General Appearance: Casual  Eye Contact:  Fair  Speech:  Clear and Coherent and Normal Rate  Volume:  Normal  Mood:  Euthymic  Affect:  Appropriate and Congruent  Thought Process:  Coherent, Goal Directed and Descriptions of Associations: Intact  Orientation:  Full (Time, Place, and Person)  Thought Content:  Logical  Suicidal Thoughts:  No  Homicidal Thoughts:  No  Memory:  Immediate;   Fair Recent;   Fair Remote;   Fair  Judgement:  Good  Insight:  Fair  Psychomotor Activity:  Normal  Concentration:  Concentration: Good and Attention Span: Good  Recall:   Good  Fund of Knowledge:  Good  Language:  Good  Akathisia:  No  Handed:  Right  AIMS (if indicated):     Assets:  Communication Skills Desire for Improvement Financial Resources/Insurance Housing Intimacy Leisure Time  ADL's: unable to assess   Cognition:  WNL  Sleep:        Treatment Plan Summary: Case discussed with Dr Dwyane Dee. Recommend consider reconsult psychiatry if necessary  Disposition: No evidence of imminent risk to self or others at present.   Patient does not meet criteria for psychiatric inpatient admission.  This service was provided via telemedicine using a 2-way, interactive audio and video technology.  Names of all persons participating in this telemedicine service and their role in this encounter. Name: Arahchige Eppolito Role: Patient  Name: Letitia Libra Role: National, [place] 07/14/2019 3:00 PM

## 2019-07-14 NOTE — Progress Notes (Signed)
PROGRESS NOTE    Seth Mccall  HYW:[id number] DOB: [birth year] DOA: 07/12/2019 PCP: [name], MD   Brief Narrative: 86 year old male with history of CAD, HTN, gout, prostate cancer, arthritis requiring back fusion C3-C4-C5 presented from [facility] facility with worsening confusion.  At baseline able to ambulate with a walker but has been feeling worse since Monday for past 6 days PTA.  Apparently he seemed to have worsening of his chronic right-sided weakness, has chronic right arm weakness secondary to cord impingement and right lower extremity weakness secondary to injury in the past.  Patient has been trying to urinate more frequently with a small amount of urine produced also reporting upper extremity pain and still hypothermic down to 95 on arrival to the ER placed in a bear hugger and nursing home reported he has been declining over the past week or so.  Daughter who is a Hospital doctor reports some mental status changes for past week and seem to be disoriented.  Has received a Covid vaccine  X2 Work-up in the ER showed CT scan head no acute finding UA unremarkable, B12 TSH stable, chest x-ray clear lungs, right upper quadrant ultrasound negative.  Patient was admitted for acute encephalopathy.  Subjective:  Seen this am-appears alert,awake today.  He is able to tell me his name that he is in [facility].  He says "according to my son I am being evaluated for memory problems" He does not remember how he ended up to the hospital. Able to move his left side well he reports he has a chronic weakness on the right side.  Assessment & Plan:  Acute metabolic encephalopathy: Unclear etiology suspect due to dehydration multiple comorbidities question underlying memory issues.  Per daughter who is a physician he does not have diagnosed dementia.  CT head unremarkable.is non focal with chronic rt sided weakness. MRI brain MRI C-spine limited but no acute finding/no new  changes.TSH/Vitamin B12 and ammonia level/ influenza screen negative.  Overall mental status much improved.  We will continue with hydration, PT OT, supportive care.Behavioral health assessment pending.   SIRS: Unclear etiology could be dehydration, at this time no known infection UA and chest x-ray unremarkable.  Blood cultures urine cultures sent from the ER and so far no growth. Question viral etiology.  COVID-19 negative.  Cough/runny nose-sometimes he coughs but no runny nose.  Speech eval completed he is on dysphagia 3 diet   Venous stasis ulcers/chronic hyperpigmented edematous leg.  Monitor supportive care.  Seen by wound care continue local wound care.  Anemia hemoglobin 10.8/thrombocytopenia platelet 100,000k. Monitor closely while on Lovenox.  Malignant neoplasm of prostate, chronic.  SWN:IOEVO stable. hold off meds- resume om d/c  Elevated LFTs: Right upper quadrant ultrasound unremarkable.  Acute viral hepatitis panel pending.  Arteries are slightly abnormal.  Advised outpatient follow-up in 5 to 7 days.  Low prealbumin level.  Monitor.  Augment nutrition  Mildly +/Borderline Trop- 12-28 ( <90) no chest pain. likely type II/demand mismatch  Goals of care: Patient is DNR.  Discussed with patient's daughter she is wondering whether he is a hospice candidate.  I have requested palliative care consultation likely can have palliative care follow-up at assisted living.  Body mass index is 26.22 kg/m.   DVT prophylaxis:not mobile, mod-high risk-add lovenox Code Status: DNR/DNI. Family Communication:updated daughter -she prefers patient return to [facility] assisted living facility with his wife and resume his home health  Disposition Plan: Patient is from:[facility] Anticipated Disposition: to ALF, in 1-2  days Barriers to discharge or conditions that needs to be met prior to discharge Remains hospitalized for ongoing management of his confusion,for iv hydration. I spoke with  Daughter-she would like him to return ALF as he lives with his wife at ALF.    Consultants: Psych, palliative care. Procedures:see note  MRI Brain Suboptimal evaluation due to artifact.  No acute infarction.  MRI C spine Operative and degenerative changes as detailed above. Suboptimal stenosis evaluation due to artifact. However, appearance is similar to the prior study. Canal stenosis remains greatest at C3-C4 and C4-C5. Foraminal stenosis greatest at C4-C5. Stable abormal cord signal at C4 and C5 reflecting myelomalacia.  Microbiology:see note  Medications: Scheduled Meds:  aspirin EC  81 mg Oral Daily   atorvastatin  10 mg Oral q1800   enoxaparin (LOVENOX) injection  40 mg Subcutaneous Q24H   feeding supplement (ENSURE ENLIVE)  237 mL Oral TID BM   hydrocerin   Topical BID   mirabegron ER  50 mg Oral Daily   multivitamin with minerals  1 tablet Oral Daily   Continuous Infusions:   Antimicrobials: Anti-infectives (From admission, onward)   None       Objective: Vitals: Today's Vitals   07/13/19 2038 07/14/19 0000 07/14/19 0359 07/14/19 1249  BP: (!) 102/50  (!) 145/56 (!) 148/87  Pulse: (!) 58  63 74  Resp: _0 Temp: 97.8 F (36.6 C)  97.8 F (36.6 C) 97.8 F (36.6 C)  TempSrc: Oral  Oral Oral  SpO2: 97%  99% 98%  Weight:      Height:      PainSc:  0-No pain 0-No pain     Intake/Output Summary (Last 24 hours) at 07/14/2019 1416 Last data filed at 07/14/2019 1000 Gross per 24 hour  Intake 1720 ml  Output 1100 ml  Net 620 ml   Filed Weights   07/12/19 2254  Weight: 60.9 kg   Weight change:    Intake/Output from previous day: 02/22 0701 - 02/23 0700 In: 2415.3 [P.O.:100; I.V.:2315.3] Out: 1250 [Urine:1250] Intake/Output this shift: Total I/O In: 120 [P.O.:120] Out: -   Examination:  General exam:Alert awake oriented x2 able to follow, follows commands well., does not remember the month or day but able to tell me the year.    HEENT:Oral mucosa moist, Ear/Nose WNL grossly,dentition normal. Respiratory system:Bilaterally clear without crackles,non tender. Cardiovascular system:S1 & S2 +, regular, No JVD. Gastrointestinal system:Abdomen soft, NT,ND, BS+. Nervous System:Alert, awake, follows commands, able to move left side well chronic right-sided weakness present.   Extremities:chronic hyperpigmented legs. Skin: No rashes,no icterus. MSK: Normal muscle bulk,tone, power.  Data Reviewed: I have personally reviewed following labs and imaging studies CBC: Recent Labs  Lab 07/12/19 1819 07/13/19 0151  WBC 7.2 5.4  NEUTROABS 5.7  --   HGB 11.9* 10.8*  HCT 36.7* 33.3*  MCV 95.6 94.6  PLT 107* 850*   Basic Metabolic Panel: Recent Labs  Lab 07/12/19 1819 07/12/19 2320 07/13/19 0151  NA 141  --  143  K 4.5  --  4.4  CL 106  --  111  CO2 27  --  24  GLUCOSE 78  --  74  BUN 29*  --  27*  CREATININE 0.79  --  0.93  CALCIUM 9.3  --  8.7*  MG  --  1.9 1.7  PHOS  --  3.8 3.6   GFR: Estimated Creatinine Clearance: 43.1 mL/min (by C-G formula based on SCr of 0.93 mg/dL).  Liver Function Tests: Recent Labs  Lab 07/12/19 1819 07/13/19 0151  AST 54* 47*  ALT 61* 51*  ALKPHOS 76 64  BILITOT 0.6 0.6  PROT 6.9 6.0*  ALBUMIN 3.1* 2.7*   Recent Labs  Lab 07/12/19 2320  LIPASE 38   Recent Labs  Lab 07/12/19 2320  AMMONIA 34   Coagulation Profile: Recent Labs  Lab 07/12/19 2320  INR 1.2   Cardiac Enzymes: Recent Labs  Lab 07/12/19 2320  CKTOTAL 627*   BNP (last 3 results) No results for input(s): PROBNP in the last 8760 hours. HbA1C: No results for input(s): HGBA1C in the last 72 hours. CBG: No results for input(s): GLUCAP in the last 168 hours. Lipid Profile: No results for input(s): CHOL, HDL, LDLCALC, TRIG, CHOLHDL, LDLDIRECT in the last 72 hours. Thyroid Function Tests: Recent Labs    07/12/19 2102  TSH 3.172   Anemia Panel: Recent Labs    07/12/19 2104 07/12/19 2320  07/13/19 0151  VITAMINB12 881  --   --   FOLATE  --   --  13.9  FERRITIN  --  201  --   TIBC  --   --  248*  IRON  --   --  74  RETICCTPCT  --   --  1.7   Sepsis Labs: Recent Labs  Lab 07/12/19 1819 07/12/19 2320  LATICACIDVEN 1.1 0.8    Recent Results (from the past 240 hour(s))  Blood Culture (routine x 2)     Status: None (Preliminary result)   Collection Time: 07/12/19  6:03 PM   Specimen: BLOOD  Result Value Ref Range Status   Specimen Description   Final    BLOOD RIGHT ANTECUBITAL Performed at [facility], [place] [address], [place], [place] [address]    Special Requests   Final    BOTTLES DRAWN AEROBIC AND ANAEROBIC Blood Culture results may not be optimal due to an excessive volume of blood received in culture bottles Performed at [facility] [address], [place], [place] [address]    Culture   Final    NO GROWTH 2 DAYS Performed at [facility] [address] [place], [place] [address]    Report Status PENDING  Incomplete  Urine culture     Status: None   Collection Time: 07/12/19  8:09 PM   Specimen: In/Out Cath Urine  Result Value Ref Range Status   Specimen Description   Final    IN/OUT CATH URINE Performed at [facility] [address], [place], [place] [address]    Special Requests   Final    NONE Performed at [facility], [place] [address], [place],  [address]    Culture   Final    NO GROWTH Performed at [facility], [place] [address] [place],  [address]    Report Status 07/13/2019 FINAL  Final  Respiratory Panel by RT PCR (Flu A&B, Covid) - Nasopharyngeal Swab     Status: None   Collection Time: 07/12/19  9:48 PM   Specimen: Nasopharyngeal Swab  Result Value Ref Range Status   SARS Coronavirus 2 by RT PCR NEGATIVE NEGATIVE Final    Comment: (NOTE) SARS-CoV-2 target nucleic acids are NOT DETECTED. The SARS-CoV-2 RNA is generally detectable in upper  respiratoy specimens during the acute phase of infection. The lowest concentration of SARS-CoV-2 viral copies this assay can detect is 131 copies/mL. A negative result does not preclude SARS-Cov-2 infection  and should not be used as the sole basis for treatment or other patient management decisions. A negative result may occur with  improper specimen collection/handling, submission of specimen other than nasopharyngeal swab, presence of viral mutation(s) within the areas targeted by this assay, and inadequate number of viral copies (<131 copies/mL). A negative result must be combined with clinical observations, patient history, and epidemiological information. The expected result is Negative. Fact Sheet for Patients:  PinkCheek.be Fact Sheet for Healthcare Providers:  GravelBags.it This test is not yet ap proved or cleared by the [place] FDA and  has been authorized for detection and/or diagnosis of SARS-CoV-2 by FDA under an Emergency Use Authorization (EUA). This EUA will remain  in effect (meaning this test can be used) for the duration of the COVID-19 declaration under Section 564(b)(1) of the Act, 21 U.S.C. section 360bbb-3(b)(1), unless the authorization is terminated or revoked sooner.    Influenza A by PCR NEGATIVE NEGATIVE Final   Influenza B by PCR NEGATIVE NEGATIVE Final    Comment: (NOTE) The Xpert Xpress SARS-CoV-2/FLU/RSV assay is intended as an aid in  the diagnosis of influenza from Nasopharyngeal swab specimens and  should not be used as a sole basis for treatment. Nasal washings and  aspirates are unacceptable for Xpert Xpress SARS-CoV-2/FLU/RSV  testing. Fact Sheet for Patients: PinkCheek.be Fact Sheet for Healthcare Providers: GravelBags.it This test is not yet approved or cleared by the [place] FDA and  has been authorized for  detection and/or diagnosis of SARS-CoV-2 by  FDA under an Emergency Use Authorization (EUA). This EUA will remain  in effect (meaning this test can be used) for the duration of the  Covid-19 declaration under Section 564(b)(1) of the Act, 21  U.S.C. section 360bbb-3(b)(1), unless the authorization is  terminated or revoked. Performed at [facility], [place] [address] [place], [place] [address]   Blood Culture (routine x 2)     Status: None (Preliminary result)   Collection Time: 07/12/19 11:20 PM   Specimen: BLOOD  Result Value Ref Range Status   Specimen Description BLOOD LEFT ANTECUBITAL  Final   Special Requests   Final    BOTTLES DRAWN AEROBIC ONLY Blood Culture adequate volume Performed at [facility] [address] [place], [place] [address]    Culture   Final    NO GROWTH 1 DAY Performed at [facility], [place] [address] [place], [place] [address]    Report Status PENDING  Incomplete      Radiology Studies: CT Head Wo Contrast  Result Date: 07/12/2019 CLINICAL DATA:  Increasing right-sided weakness for 3 days EXAM: CT HEAD WITHOUT CONTRAST TECHNIQUE: Contiguous axial images were obtained from the base of the skull through the vertex without intravenous contrast. COMPARISON:  05/06/2019 FINDINGS: Brain: No acute infarct or hemorrhage. Lateral ventricles and midline structures are unremarkable. Stable basal ganglia calcifications. No acute extra-axial fluid collections. No mass effect. Vascular: No hyperdense vessel or unexpected calcification. Skull: Normal. Negative for fracture or focal lesion. Sinuses/Orbits: No acute finding. Other: None IMPRESSION: 1. No acute intracranial process. Electronically Signed   By: Randa Ngo M.D.   On: 07/12/2019 19:38   MR BRAIN WO CONTRAST  Result Date: 07/13/2019 CLINICAL DATA:  Weakness and altered mental status EXAM: MRI HEAD WITHOUT CONTRAST TECHNIQUE: Multiplanar, multiecho pulse sequences  of the brain and surrounding structures were obtained without intravenous contrast. COMPARISON:  None. FINDINGS: Artifact is present. Brain: There is no acute infarction or  hemorrhage within the above limitation. Patchy T2 hyperintensity in the supratentorial white matter is nonspecific but may reflect mild chronic microvascular ischemic changes. Prominence of the ventricles and sulci reflects mild parenchymal volume loss. No intracranial mass or mass effect identified. Vascular: Major vessel flow voids where visualized are preserved. Skull and upper cervical spine: Unremarkable. Sinuses/Orbits: Left maxillary sinus retention cyst or polyp. Bilateral lens replacements. Other: None. IMPRESSION: Suboptimal evaluation due to artifact.  No acute infarction. Electronically Signed   By: Macy Mis M.D.   On: 07/13/2019 15:07   MR Cervical Spine Wo Contrast  Result Date: 07/13/2019 CLINICAL DATA:  Right-sided weakness EXAM: MRI CERVICAL SPINE WITHOUT CONTRAST TECHNIQUE: Multiplanar, multisequence MR imaging of the cervical spine was performed. No intravenous contrast was administered. COMPARISON:  2014 FINDINGS: Alignment: Stable. Vertebrae: Postoperative changes of prior anterior fusion at C3-C5. Hardware is not well evaluated and there is associated susceptibility artifact. No substantial marrow edema. No suspicious osseous lesion. Cord: Abnormal signal is again identified at C4 and C5 levels reflecting myelomalacia. Posterior Fossa, vertebral arteries, paraspinal tissues: Subcentimeter left thyroid nodule. Disc levels: C2-C3:  Disc bulge.  No significant canal or foraminal stenosis. C3-C4: Operative level. Endplate osteophytes particularly at the C4 level again causing moderate to severe canal stenosis with flattening of the ventral cord. Mild foraminal stenosis is similar. C4-C5: Operative level. Endplate osteophytes and facet and uncovertebral hypertrophy. Moderate to marked canal canal stenosis. Left greater  than right moderate to marked foraminal stenosis. Appearance is similar. C5-C6: Endplate osteophytes and facet and uncovertebral hypertrophy. Mild canal and foraminal stenosis. Appearance is similar. C6-C7: Bridging endplate osteophytes and left greater than right facet and uncovertebral hypertrophy. No canal stenosis. Probable mild foraminal stenosis. Appearance is similar. C7-T1: Disc bulge, endplate osteophytes, and facet hypertrophy. No canal stenosis. Mild foraminal stenosis. Appearance is similar. IMPRESSION: Operative and degenerative changes as detailed above. Suboptimal stenosis evaluation due to artifact. However, appearance is similar to the prior study. Canal stenosis remains greatest at C3-C4 and C4-C5. Foraminal stenosis greatest at C4-C5. Stable abnormal cord signal at C4 and C5 reflecting myelomalacia. Electronically Signed   By: Macy Mis M.D.   On: 07/13/2019 15:00   DG Chest Port 1 View  Result Date: 07/12/2019 CLINICAL DATA:  Hypothermia EXAM: PORTABLE CHEST 1 VIEW COMPARISON:  08/21/2017 FINDINGS: The heart size and mediastinal contours are within normal limits. Both lungs are clear. The visualized skeletal structures are unremarkable. IMPRESSION: No active disease. Electronically Signed   By: Ulyses Jarred M.D.   On: 07/12/2019 19:01   DG Swallowing Func-Speech Pathology  Result Date: 07/13/2019 Objective Swallowing Evaluation: Type of Study: MBS-Modified Barium Swallow Study  Patient Details Name: JAYVIN HURRELL MRN: [id number] Date of Birth: 26-Jan-1930 Today's Date: 07/13/2019 Time: SLP Start Time (ACUTE ONLY): 28 -SLP Stop Time (ACUTE ONLY): 1339 SLP Time Calculation (min) (ACUTE ONLY): 29 min Past Medical History: Past Medical History: Diagnosis Date  Arthritis   Complication of anesthesia   limited movement in neck because of fusion  Gout   Hypertension   Prostate cancer [facility])  Past Surgical History: Past Surgical History: Procedure Laterality Date  BACK SURGERY   2005  fusion of C3, C4, and C5  CATARACT EXTRACTION Bilateral   CORONARY ATHERECTOMY N/A 08/12/2017  Procedure: CORONARY ATHERECTOMY;  Surgeon: Adrian Prows, MD;  Location: [facility];  Service: Cardiovascular;  Laterality: N/A;  CORONARY STENT INTERVENTION N/A 08/12/2017  Procedure: CORONARY STENT INTERVENTION;  Surgeon: Adrian Prows, MD;  Location: [facility];  Service: Cardiovascular;  Laterality: N/A;  GREEN LIGHT LASER TURP (TRANSURETHRAL RESECTION OF PROSTATE N/A 10/10/2012  Procedure: GREEN LIGHT LASER TURP (TRANSURETHRAL RESECTION OF PROSTATE;  Surgeon: Claybon Jabs, MD;  Location: [facility];  Service: Urology;  Laterality: N/A;  LEFT HEART CATH AND CORONARY ANGIOGRAPHY N/A 08/12/2017  Procedure: LEFT HEART CATH AND CORONARY ANGIOGRAPHY;  Surgeon: Adrian Prows, MD;  Location: [facility];  Service: Cardiovascular;  Laterality: N/A;  PROSTATE BIOPSY    TRANSURETHRAL RESECTION OF PROSTATE  10-10-2012 HPI: Pt is an 86 yo male with h/o prostate cancer, arthritis, requiring cervical spine fustion of C3-C5, chronic right sided weakness - due to cord impingment.  At his baseline able to ambulate with a walker but has been feeling worse since Monday for the past 6 days per MD note with poor intake, congested cough and worsening of his chronic right-sided weakness.  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  Pt with poor po intake and congested cough with wet voice recently. Daughter is a [name] in town - SLP phoned her after evaluation and she reported pt with one week duration of pt's wet cough, choking on saliva causing fear to swallow.  She reports pt was moved to an ALF in November and since this time, he has lost 30 pounds.  Daughter reports he does not like the food there but believes he eats what he can tolerate.  Swallow evaluation ordered.  Subjective: pt awake in bed, daughter present  Assessment / Plan / Recommendation CHL IP CLINICAL IMPRESSIONS 07/13/2019 Clinical Impression Mild oropharyngeal = cervical esophageal dysphagia present.  Mildly decreased oral cohesion of boluses due to weakness/discoordination result in trace to mild oral retention that pt swallows reflexively.  Pharyngeal swallow is strong without severe residuals.  Pt does demonstrate laryngeal penetration and mild aspiration of thin *inconsistent due to decreased laryngeal elevation.  And he is consistently aspirating secretions and thus will benefit from oral suction before and after po intake.  In addition, pt is observed to have mild backflow of thin (mixed with secretions) from cervical esopahgus into pharynx with subsequent mild aspiration of backflowed material.  He did not overtly cough with mild aspiration but cued cough was helpful to decrease amount.   Upon esophageal sweep, pt appeared with retention of barium distally with retrograde propulsion to proximal thoracic region without awareness.  Puree nor thin bolus additions appeared to facilitate clearance - suspect pt with component of dysmotlity but radiologist not present to confirm.  Pt appears quite kyphotic and suspect this with suspected esophageal dysphagia is contributing to his aspiration risk.  Informed pt and daughter to concerns.  Pt unable to have an esophagram due to his aspiration with this test.  Would recommend small frequent meals be provided to pt given concerns for esophageal clearance - especially given his not sensate.   Minimum chin tuck posture noted - as pt's ACDF - fusion C3, C4, C5 prevents full posture.  SLP did not test barium tablet and would advise pill of significant size be crushed or be given in suspension form.    Test results discussed briefly with pt and pt's daughter Dr Durwin Reges in flouro room.  Dr Durwin Reges verbalized just prior to his MBS that she did not believe pt would tolerate a modified diet.  Hopefully pt's sialorrhea  will improve -?  whether source is hypersalivation vs decreased management of saliva.  Question source of this sialorrhea??? SLP Visit Diagnosis Dysphagia, pharyngoesophageal phase (R13.14);Dysphagia, oropharyngeal phase (R13.12) Attention and concentration deficit following -- Frontal lobe and executive function deficit following -- Impact on safety and function Risk for inadequate nutrition/hydration;Moderate aspiration risk;Mild aspiration risk   CHL IP TREATMENT RECOMMENDATION 07/13/2019 Treatment Recommendations Therapy as outlined in treatment plan below   Prognosis 07/13/2019 Prognosis for Safe Diet Advancement Fair Barriers to Reach Goals Severity of deficits;Other (Comment) Barriers/Prognosis Comment weight loss of 30 pounds prior to admission CHL IP DIET RECOMMENDATION 07/13/2019 SLP Diet Recommendations Dysphagia 3 (Mech soft) solids;Thin liquid Liquid Administration via Cup;No straw Medication Administration Crushed with puree Compensations Slow rate;Small sips/bites;Minimize environmental distractions Postural Changes Remain semi-upright after after feeds/meals (Comment);Seated upright at 90 degrees   CHL IP OTHER RECOMMENDATIONS 07/13/2019 Recommended Consults -- Oral Care Recommendations Oral care BID Other Recommendations --   CHL IP FOLLOW UP RECOMMENDATIONS 07/13/2019 Follow up Recommendations Other (comment)   CHL IP FREQUENCY AND DURATION 07/13/2019 Speech Therapy Frequency (ACUTE ONLY) min 2x/week Treatment Duration 2 weeks      CHL IP ORAL PHASE 07/13/2019 Oral Phase Impaired Oral - Pudding Teaspoon -- Oral - Pudding Cup -- Oral - Honey Teaspoon -- Oral - Honey Cup -- Oral - Nectar Teaspoon -- Oral - Nectar Cup Premature spillage;Lingual/palatal residue;Piecemeal swallowing Oral - Nectar Straw NT Oral - Thin Teaspoon WFL Oral - Thin Cup Lingual/palatal residue;Premature spillage;Decreased bolus cohesion Oral - Thin Straw WFL;Piecemeal swallowing Oral - Puree WFL;Lingual/palatal residue Oral - Mech  Soft WFL Oral - Regular -- Oral - Multi-Consistency -- Oral - Pill -- Oral Phase - Comment --  CHL IP PHARYNGEAL PHASE 07/13/2019 Pharyngeal Phase Impaired Pharyngeal- Pudding Teaspoon -- Pharyngeal -- Pharyngeal- Pudding Cup -- Pharyngeal -- Pharyngeal- Honey Teaspoon -- Pharyngeal -- Pharyngeal- Honey Cup -- Pharyngeal -- Pharyngeal- Nectar Teaspoon -- Pharyngeal -- Pharyngeal- Nectar Cup [facility] Pharyngeal Material does not enter airway Pharyngeal- Nectar Straw -- Pharyngeal -- Pharyngeal- Thin Teaspoon WFL Pharyngeal Material does not enter airway Pharyngeal- Thin Cup Penetration/Aspiration during swallow;Penetration/Apiration after swallow Pharyngeal Material enters airway, passes BELOW cords without attempt by patient to eject out (silent aspiration) Pharyngeal- Thin Straw -- Pharyngeal -- Pharyngeal- Puree Delayed swallow initiation-vallecula;WFL Pharyngeal Material does not enter airway Pharyngeal- Mechanical Soft WFL;Delayed swallow initiation-vallecula Pharyngeal Material does not enter airway Pharyngeal- Regular -- Pharyngeal -- Pharyngeal- Multi-consistency -- Pharyngeal -- Pharyngeal- Pill -- Pharyngeal -- Pharyngeal Comment Pt with consistent mild penetration of thin mixed with secretions and mild aspiration without sensation, Limited neck ROM noted due to his ACDF preventing adequate chin tuck posture, Decreased laryngeal elevation/closure allows penetration and decreased clearance into proximal esophagus allows backflow of thin barium *Mixed with secretions* into pharynx and subsequently into larynx without awareness.  Pt appears quite kyphotic which is likley contributing to his dysphagia symptoms.  Pt is aspirating secretions and thus recommend oral suction prior to and after po intake.  Effortful swallow tested but not helpful to decrease backflow of liquid nor close airway.  CHL IP CERVICAL ESOPHAGEAL PHASE 07/13/2019 Cervical Esophageal Phase Impaired Pudding Teaspoon -- Pudding Cup -- Honey Teaspoon  -- Honey Cup -- Nectar Teaspoon -- Nectar Cup WFL Nectar Straw -- Thin Teaspoon WFL Thin Cup Esophageal backflow into the pharynx Thin Straw Esophageal backflow into the pharynx Puree WFL Mechanical Soft WFL Regular -- Multi-consistency -- Pill -- Cervical Esophageal Comment possible reduced CP relaxation  As pt with backflow of thin barium and secretions from cervical esophagus into pharynx and subsequently into larynx Tammy  Raliegh Ip MS [facility] SLP Acute Rehab Services Office [phone] Macario Golds 07/13/2019, 8:13 PM              US Abdomen Limited RUQ  Result Date: 07/13/2019 CLINICAL DATA:  Elevated liver function tests EXAM: ULTRASOUND ABDOMEN LIMITED RIGHT UPPER QUADRANT COMPARISON:  None. FINDINGS: Gallbladder: No gallstones or wall thickening visualized. No sonographic Murphy sign noted by sonographer. Common bile duct: Diameter: 6 mm.  Where visualized, no filling defect. Liver: No focal lesion identified. Within normal limits in parenchymal echogenicity. Portal vein is patent on color Doppler imaging with normal direction of blood flow towards the liver. IMPRESSION: Negative right upper quadrant ultrasound Electronically Signed   By: Monte Fantasia M.D.   On: 07/13/2019 06:33     LOS: 1 day   Time spent: More than 50% of that time was spent in counseling and/or coordination of care.  Antonieta Pert, MD [facility]  07/14/2019, 2:16 PM

## 2019-07-14 NOTE — TOC Progression Note (Signed)
Transition of Care [facility]) - Progression Note    Patient Details  Name: Seth Mccall MRN: [id number] Date of Birth: July 04, 1929  Transition of Care [facility]) CM/SW Contact  Purcell Mouton, RN Phone Number: 07/14/2019, 12:28 PM  Clinical Narrative:    Pt is active with [facility] for HHRN/PT/OT and will need orders.    Expected Discharge Plan: Assisted Living Barriers to Discharge: No Barriers Identified  Expected Discharge Plan and Services Expected Discharge Plan: Assisted Living   Discharge Planning Services: CM Consult   Living arrangements for the past 2 months: Assisted Living Facility                                       Social Determinants of Health (SDOH) Interventions    Readmission Risk Interventions No flowsheet data found.

## 2019-07-14 NOTE — TOC Progression Note (Signed)
Transition of Care [facility]) - Progression Note    Patient Details  Name: Seth Mccall MRN: [id number] Date of Birth: December 07, 1929  Transition of Care [facility]) CM/SW Contact  Purcell Mouton, RN Phone Number: 07/14/2019, 1:05 PM  Clinical Narrative:    Spoke with pt's daughter Seth Mccall concerning disposition and PT recommendation for SNF. Daughter states that pt will not do well at a SNF. Continues to states that she has spoken to [place], Therapist, sports at [place] and plan for pt to return. A call was made to [place], VM left for [facility] [place] to recall CM call. Will complet2 FL2 and wait for call from [facility].    Expected Discharge Plan: Assisted Living Barriers to Discharge: No Barriers Identified  Expected Discharge Plan and Services Expected Discharge Plan: Assisted Living   Discharge Planning Services: CM Consult   Living arrangements for the past 2 months: Assisted Living Facility                                       Social Determinants of Health (SDOH) Interventions    Readmission Risk Interventions No flowsheet data found.

## 2019-07-15 ENCOUNTER — Inpatient Hospital Stay (HOSPITAL_COMMUNITY): Payer: Medicare HMO

## 2019-07-15 DIAGNOSIS — D649 Anemia, unspecified: Secondary | ICD-10-CM | POA: Diagnosis present

## 2019-07-15 DIAGNOSIS — E46 Unspecified protein-calorie malnutrition: Secondary | ICD-10-CM

## 2019-07-15 DIAGNOSIS — G9340 Encephalopathy, unspecified: Secondary | ICD-10-CM

## 2019-07-15 DIAGNOSIS — R634 Abnormal weight loss: Secondary | ICD-10-CM

## 2019-07-15 LAB — HEPATITIS PANEL, ACUTE
HCV Ab: <0.1 s/co ratio — AB (ref 0.0–0.9)
Hep A IgM: NEGATIVE — AB
Hep B C IgM: NEGATIVE — AB
Hepatitis B Surface Ag: NEGATIVE — AB

## 2019-07-15 MED ORDER — IOHEXOL 9 MG/ML PO SOLN
500.0000 mL | ORAL | Status: AC
  Administered 2019-07-15: 500 mL via ORAL

## 2019-07-15 MED ORDER — IOHEXOL 9 MG/ML PO SOLN
ORAL | Status: AC
  Filled 2019-07-15: qty 1000

## 2019-07-15 MED ORDER — IOHEXOL 12 MG/ML PO SOLN: 500.0000 mL | ORAL | Status: DC

## 2019-07-15 MED ORDER — ERYTHROMYCIN 5 MG/GM OP OINT
TOPICAL_OINTMENT | Freq: Three times a day (TID) | OPHTHALMIC | Status: DC
  Administered 2019-07-16 – 2019-07-19 (×3): 1 via OPHTHALMIC
  Filled 2019-07-15: qty 3.5
  Filled 2019-07-15: qty 1

## 2019-07-15 MED ORDER — SODIUM CHLORIDE 0.9 % IV SOLN
3.0000 g | Freq: Four times a day (QID) | INTRAVENOUS | Status: DC
  Administered 2019-07-15 – 2019-07-19 (×16): 3 g via INTRAVENOUS
  Filled 2019-07-15 (×3): qty 3
  Filled 2019-07-15: qty 8
  Filled 2019-07-15 (×3): qty 3
  Filled 2019-07-15 (×3): qty 8
  Filled 2019-07-15 (×2): qty 3
  Filled 2019-07-15: qty 8
  Filled 2019-07-15 (×3): qty 3
  Filled 2019-07-15: qty 8
  Filled 2019-07-15 (×2): qty 3
  Filled 2019-07-15: qty 8

## 2019-07-15 NOTE — Progress Notes (Signed)
SLP Cancellation Note  Patient Details Name: Seth Mccall MRN: [id number] DOB: 1930/03/21   Cancelled treatment:       Reason Eval/Treat Not Completed: Other (comment)([name], will continue efforts)  Spoke to [employer] and confirmed plan.   Kathleen Lime, MS [facility] SLP Acute Rehab Services Office [phone]    Macario Golds 07/15/2019, 10:51 AM

## 2019-07-15 NOTE — Progress Notes (Signed)
RN washed patient's BLE, patted them dry, and applied Eucerin cream as ordered. Pt c/o burning sensation on BLE. RN washed BLE with soap and warm water, pt continues to c/o burning in BLE, RN repeated the washing process and notified Tylene Fantasia, NP. Pt asleep and resting comfortably when RN left the room. RN will continue to monitor.

## 2019-07-15 NOTE — Progress Notes (Signed)
PROGRESS NOTE  Seth Mccall  [id number] DOB: [birth year] DOA: 07/12/2019 PCP: [name], MD   Brief Narrative: Seth Mccall is an 86 y.o. male with a history of CAD, HTN, gout, prostate CA s/p XRT, cervical spine disease s/p C3-5 fusion who presented from [facility] on 2/21 due to confusion and decreased strength and functional capacity, specifically worse right-sided weakness from baseline. Also had frequent small volumes of urine. Work-up in the ER showed CT scan head no acute finding UA unremarkable, B12 TSH stable, chest x-ray clear lungs, right upper quadrant ultrasound negative.  Patient was admitted for acute encephalopathy.  Assessment & Plan: Active Problems:   Malignant neoplasm of prostate (HCC)   SIRS (systemic inflammatory response syndrome) (HCC)   Acute metabolic encephalopathy   Elevated LFTs   Acute delirium   Acute encephalopathy   Unintentional weight loss   Anemia   Protein-calorie malnutrition ([place])  Acute metabolic encephalopathy: Unclear etiology suspect due to dehydration multiple comorbidities question underlying memory issues. CT head unremarkable.is non focal with chronic rt sided weakness. MRI brain limited but no acute finding. TSH/Vitamin B12 and ammonia level/ influenza screen negative. - Overall mental status much improved.  We will continue with hydration, PT OT, supportive care.  SIRS: Suspect dehydration and possibly aspiration pneumonitis, with no pyuria and no CXR infiltrates with no fever and negative cultures to date. Covid-19 negative, flu negative.  - Monitor vital signs  Oropharyngeal dysphagia: Mild on MBSS with witnessed aspiration of backflowed material.  - SLP evaluations ongoing.   Venous stasis hyperpigmentation: No wounds. - WOC consulted, moisturize area, redistribution as tolerated.   Anemia of chronic disease, thrombocytopenia:  - Recheck in AM, hasn't been checked since admission and pt is on  lovenox.  Prostate CA: s/p TURP and XRT per pt.  - Check PVR. - Continue myrbetriq  HTN: - Holding BP medications at this time.   Elevated LFTs: Right upper quadrant ultrasound unremarkable.  Acute viral hepatitis panel pending.  Arteries are slightly abnormal.   - Recheck in AM  Low prealbumin level, unspecified severity but appears to have protein calorie malnutrition:  - Protein supplementation ordered.  Mild demand myocardial ischemia, CAD: No chest pain - No inpatient interventions planned. Continue aspirin, statin  Weight loss, failure to thrive: Patient is DNR with subacute failure to thrive for about 3 months with ~30lbs weight loss.  - Palliative care consulted, appreciate their assistance. Limiting invasive interventions/work up, so ordered CT abd/pelvis due to anemia and constitutional symptoms in hopes to essentially rule out abdominal/GI tract malignancy. Agree with having palliative care follow at ALF vs. hospice.   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  - Pain control - PT/OT as much as possible, though rehabilitation potential is limited.  DVT prophylaxis: Lovenox Code Status: DNR Family Communication: Daughter by phone after CT results available Disposition Plan: Likely to return to [facility] with palliative care services.   Consultants:   Palliative care medicine team  Psychiatry  Procedures:   None  Antimicrobials:  None   Subjective: Pain all over is stable, denies hunger/thirst. No other complaints. Denies chest pain or shortness of breath.   Objective: Vitals:   07/14/19 1249 07/14/19 2031 07/15/19 0544 07/15/19 1338  BP: (!) 148/87 (!) 153/79 (!) 147/80 138/83  Pulse: 74 75 72 75  Resp: 18 18 18    Temp: 97.8 F (36.6 C) (!) 97.5 F (36.4 C) 97.7 F (36.5 C)   TempSrc: Oral Oral Oral  SpO2: 98% 98% 100% 96%  Weight:      Height:        Intake/Output Summary (Last 24 hours) at 07/15/2019 1644 Last data filed  at 07/15/2019 [id number] Gross per 24 hour  Intake 600 ml  Output 1000 ml  Net -400 ml   Filed Weights   07/12/19 2254  Weight: 60.9 kg    Gen: 86 y.o. male in no distress Pulm: Non-labored breathing. Clear to auscultation bilaterally.  CV: Regular rate and rhythm. No murmur, rub, or gallop. No JVD, no pedal edema. GI: Abdomen soft, non-tender, non-distended, with normoactive bowel sounds. No organomegaly or masses felt. Ext: Warm, no deformities. Hyperpigmented bilateral LE's Skin: No rashes, lesions or other ulcers Neuro: Alert and oriented to months and year, hospital, and reason for admission. No focal neurological deficits. Psych: Judgement and insight appear intact. Mood & affect appropriate.   Data Reviewed: I have personally reviewed following labs and imaging studies  CBC: Recent Labs  Lab 07/12/19 1819 07/13/19 0151  WBC 7.2 5.4  NEUTROABS 5.7  --   HGB 11.9* 10.8*  HCT 36.7* 33.3*  MCV 95.6 94.6  PLT 107* 123XX123*   Basic Metabolic Panel: Recent Labs  Lab 07/12/19 1819 07/12/19 2320 07/13/19 0151  NA 141  --  143  K 4.5  --  4.4  CL 106  --  111  CO2 27  --  24  GLUCOSE 78  --  74  BUN 29*  --  27*  CREATININE 0.79  --  0.93  CALCIUM 9.3  --  8.7*  MG  --  1.9 1.7  PHOS  --  3.8 3.6   GFR: Estimated Creatinine Clearance: 43.1 mL/min (by C-G formula based on SCr of 0.93 mg/dL). Liver Function Tests: Recent Labs  Lab 07/12/19 1819 07/13/19 0151  AST 54* 47*  ALT 61* 51*  ALKPHOS 76 64  BILITOT 0.6 0.6  PROT 6.9 6.0*  ALBUMIN 3.1* 2.7*   Recent Labs  Lab 07/12/19 2320  LIPASE 38   Recent Labs  Lab 07/12/19 2320  AMMONIA 34   Coagulation Profile: Recent Labs  Lab 07/12/19 2320  INR 1.2   Cardiac Enzymes: Recent Labs  Lab 07/12/19 2320  CKTOTAL 627*   BNP (last 3 results) No results for input(s): PROBNP in the last 8760 hours. HbA1C: No results for input(s): HGBA1C in the last 72 hours. CBG: No results for input(s): GLUCAP in the  last 168 hours. Lipid Profile: No results for input(s): CHOL, HDL, LDLCALC, TRIG, CHOLHDL, LDLDIRECT in the last 72 hours. Thyroid Function Tests: Recent Labs    07/12/19 2102  TSH 3.172   Anemia Panel: Recent Labs    07/12/19 2104 07/12/19 2320 07/13/19 0151  VITAMINB12 881  --   --   FOLATE  --   --  13.9  FERRITIN  --  201  --   TIBC  --   --  248*  IRON  --   --  74  RETICCTPCT  --   --  1.7   Urine analysis:    Component Value Date/Time   COLORURINE YELLOW 07/12/2019 2009   APPEARANCEUR CLEAR 07/12/2019 2009   LABSPEC 1.018 07/12/2019 2009   PHURINE 5.0 07/12/2019 2009   GLUCOSEU NEGATIVE 07/12/2019 2009   HGBUR NEGATIVE 07/12/2019 2009   [place] NEGATIVE 07/12/2019 2009   [place] NEGATIVE 07/12/2019 2009   PROTEINUR 30 (A) 07/12/2019 2009   UROBILINOGEN 0.2 10/11/2012 1758   NITRITE NEGATIVE 07/12/2019 2009  LEUKOCYTESUR NEGATIVE 07/12/2019 2009   Recent Results (from the past 240 hour(s))  Blood Culture (routine x 2)     Status: None (Preliminary result)   Collection Time: 07/12/19  6:03 PM   Specimen: BLOOD  Result Value Ref Range Status   Specimen Description   Final    BLOOD RIGHT ANTECUBITAL Performed at [facility], [place] [address] [place], [place] [address]    Special Requests   Final    BOTTLES DRAWN AEROBIC AND ANAEROBIC Blood Culture results may not be optimal due to an excessive volume of blood received in culture bottles Performed at [facility] [address] [place], [place] [address]    Culture   Final    NO GROWTH 3 DAYS Performed at [facility], [place] [address] [place], [place] [address]    Report Status PENDING  Incomplete  Urine culture     Status: None   Collection Time: 07/12/19  8:09 PM   Specimen: In/Out Cath Urine  Result Value Ref Range Status   Specimen Description   Final    IN/OUT CATH URINE Performed at [facility] [address] [place], [place]  [address]    Special Requests   Final    NONE Performed at [facility], [place] [address] [place], [place] [address]    Culture   Final    NO GROWTH Performed at [facility], [place] [address], [place], [place] [address]    Report Status 07/13/2019 FINAL  Final  Respiratory Panel by RT PCR (Flu A&B, Covid) - Nasopharyngeal Swab     Status: None   Collection Time: 07/12/19  9:48 PM   Specimen: Nasopharyngeal Swab  Result Value Ref Range Status   SARS Coronavirus 2 by RT PCR NEGATIVE NEGATIVE Final    Comment: (NOTE) SARS-CoV-2 target nucleic acids are NOT DETECTED. The SARS-CoV-2 RNA is generally detectable in upper respiratoy specimens during the acute phase of infection. The lowest concentration of SARS-CoV-2 viral copies this assay can detect is 131 copies/mL. A negative result does not preclude SARS-Cov-2 infection and should not be used as the sole basis for treatment or other patient management decisions. A negative result may occur with  improper specimen collection/handling, submission of specimen other than nasopharyngeal swab, presence of viral mutation(s) within the areas targeted by this assay, and inadequate number of viral copies (<131 copies/mL). A negative result must be combined with clinical observations, patient history, and epidemiological information. The expected result is Negative. Fact Sheet for Patients:  PinkCheek.be Fact Sheet for Healthcare Providers:  GravelBags.it This test is not yet ap proved or cleared by the [place] FDA and  has been authorized for detection and/or diagnosis of SARS-CoV-2 by FDA under an Emergency Use Authorization (EUA). This EUA will remain  in effect (meaning this test can be used) for the duration of the COVID-19 declaration under Section 564(b)(1) of the Act, 21 U.S.C. section 360bbb-3(b)(1), unless the authorization is terminated or revoked  sooner.    Influenza A by PCR NEGATIVE NEGATIVE Final   Influenza B by PCR NEGATIVE NEGATIVE Final    Comment: (NOTE) The Xpert Xpress SARS-CoV-2/FLU/RSV assay is intended as an aid in  the diagnosis of influenza from Nasopharyngeal swab specimens and  should not be used as a sole basis for treatment. Nasal washings and  aspirates are unacceptable for Xpert Xpress SARS-CoV-2/FLU/RSV  testing. Fact Sheet for Patients: PinkCheek.be Fact Sheet for Healthcare  Providers: GravelBags.it This test is not yet approved or cleared by the [place] and  has been authorized for detection and/or diagnosis of SARS-CoV-2 by  FDA under an Emergency Use Authorization (EUA). This EUA will remain  in effect (meaning this test can be used) for the duration of the  Covid-19 declaration under Section 564(b)(1) of the Act, 21  U.S.C. section 360bbb-3(b)(1), unless the authorization is  terminated or revoked. Performed at [facility], [place] [address] [place], [place] [address]   Blood Culture (routine x 2)     Status: None (Preliminary result)   Collection Time: 07/12/19 11:20 PM   Specimen: BLOOD  Result Value Ref Range Status   Specimen Description BLOOD LEFT ANTECUBITAL  Final   Special Requests   Final    BOTTLES DRAWN AEROBIC ONLY Blood Culture adequate volume Performed at [facility] [address] [place], [place] [address]    Culture   Final    NO GROWTH 2 DAYS Performed at [facility] [address] [place], [place] [address]    Report Status PENDING  Incomplete      Radiology Studies: No results found.  Scheduled Meds: . aspirin EC  81 mg Oral Daily  . atorvastatin  10 mg Oral q1800  . enoxaparin (LOVENOX) injection  40 mg Subcutaneous Q24H  . feeding supplement (ENSURE ENLIVE)  237 mL Oral TID BM  . hydrocerin   Topical BID  . mirabegron ER  50 mg Oral Daily  .  multivitamin with minerals  1 tablet Oral Daily   Continuous Infusions: . sodium chloride       LOS: 2 days   Time spent: 35 minutes.  Patrecia Pour, MD [facility] [email or web address] 07/15/2019, 4:44 PM

## 2019-07-15 NOTE — Progress Notes (Signed)
Pt. retaining urine. Bladder scan showed 382 ml. MD ordered to in and out cath patient if over 300 ml and unable to urinate.

## 2019-07-15 NOTE — Progress Notes (Signed)
Pharmacy Antibiotic Note  Seth Mccall is a 86 y.o. male admitted on 07/12/2019 for acute encephalopathy. Pharmacy has been consulted for Unasyn dosing for possible aspiration pneumonia.  Plan: Unasyn 3g q6h Monitor clinical picture, renal function F/U C&S, abx deescalation / LOT   Height: 5' (152.4 cm) Weight: 134 lb 4.2 oz (60.9 kg) IBW/kg (Calculated) : 50  Temp (24hrs), Avg:97.6 F (36.4 C), Min:97.5 F (36.4 C), Max:97.7 F (36.5 C)  Recent Labs  Lab 07/12/19 1819 07/12/19 2320 07/13/19 0151  WBC 7.2  --  5.4  CREATININE 0.79  --  0.93  LATICACIDVEN 1.1 0.8  --     Estimated Creatinine Clearance: 43.1 mL/min (by C-G formula based on SCr of 0.93 mg/dL).    Allergies  Allergen Reactions  . Pravastatin     Antimicrobials this admission: Unasyn 2/24 >>   Microbiology results: 2/21 BCx: NGTD 2/21 UCx: No growth 2/21 Respiratory panel: neg  Thank you for allowing pharmacy to be a part of this patient's care.  Darnelle Bos, PharmD 07/15/2019 7:47 PM

## 2019-07-16 LAB — COMPREHENSIVE METABOLIC PANEL
ALT: 32 U/L (ref 0–44)
AST: 30 U/L (ref 15–41)
Albumin: 2.5 g/dL — ABNORMAL LOW (ref 3.5–5.0)
Alkaline Phosphatase: 65 U/L (ref 38–126)
Anion gap: 9 (ref 5–15)
BUN: 36 mg/dL — ABNORMAL HIGH (ref 8–23)
CO2: 23 mmol/L (ref 22–32)
Calcium: 8.5 mg/dL — ABNORMAL LOW (ref 8.9–10.3)
Chloride: 109 mmol/L (ref 98–111)
Creatinine, Ser: 0.94 mg/dL (ref 0.61–1.24)
GFR calc Af Amer: >60 mL/min (ref >60)
GFR calc non Af Amer: >60 mL/min (ref >60)
Glucose, Bld: 141 mg/dL — ABNORMAL HIGH (ref 70–99)
Potassium: 4.3 mmol/L (ref 3.5–5.1)
Sodium: 141 mmol/L (ref 135–145)
Total Bilirubin: 0.5 mg/dL (ref 0.3–1.2)
Total Protein: 5.9 g/dL — ABNORMAL LOW (ref 6.5–8.1)

## 2019-07-16 LAB — CBC WITH DIFFERENTIAL/PLATELET
Abs Immature Granulocytes: 0.02 10*3/uL (ref 0.00–0.07)
Basophils Absolute: 0 10*3/uL (ref 0.0–0.1)
Basophils Relative: 0 %
Eosinophils Absolute: 0.1 10*3/uL (ref 0.0–0.5)
Eosinophils Relative: 2 %
HCT: 34.4 % — ABNORMAL LOW (ref 39.0–52.0)
Hemoglobin: 11 g/dL — ABNORMAL LOW (ref 13.0–17.0)
Immature Granulocytes: 0 %
Lymphocytes Relative: 20 %
Lymphs Abs: 1 10*3/uL (ref 0.7–4.0)
MCH: 31 pg (ref 26.0–34.0)
MCHC: 32 g/dL (ref 30.0–36.0)
MCV: 96.9 fL (ref 80.0–100.0)
Monocytes Absolute: 0.5 10*3/uL (ref 0.1–1.0)
Monocytes Relative: 10 %
Neutro Abs: 3.4 10*3/uL (ref 1.7–7.7)
Neutrophils Relative %: 68 %
Platelets: 145 10*3/uL — ABNORMAL LOW (ref 150–400)
RBC: 3.55 MIL/uL — ABNORMAL LOW (ref 4.22–5.81)
RDW: 15.5 % (ref 11.5–15.5)
WBC: 5.1 10*3/uL (ref 4.0–10.5)
nRBC: 0 % (ref 0.0–0.2)

## 2019-07-16 NOTE — Progress Notes (Signed)
PROGRESS NOTE  Seth Mccall  [id number] DOB: Aug 23, 1929 DOA: 07/12/2019 PCP: [name], MD   Brief Narrative: Seth Mccall is an 86 y.o. male with a history of CAD, HTN, gout, prostate CA s/p XRT, cervical spine disease s/p C3-5 fusion who presented from [facility] on 2/21 due to confusion and decreased strength and functional capacity, specifically worse right-sided weakness from baseline. Also had frequent small volumes of urine. Work-up in the ER showed CT scan head no acute finding UA unremarkable, B12 TSH stable, chest x-ray clear lungs, right upper quadrant ultrasound negative.  Patient was admitted for acute encephalopathy.  Assessment & Plan: Active Problems:   Malignant neoplasm of prostate (HCC)   SIRS (systemic inflammatory response syndrome) (HCC)   Acute metabolic encephalopathy   Elevated LFTs   Acute delirium   Acute encephalopathy   Unintentional weight loss   Anemia   Protein-calorie malnutrition ([place])  Acute metabolic encephalopathy: Unclear etiology suspect due to dehydration multiple comorbidities question underlying memory issues. CT head unremarkable.is non focal with chronic rt sided weakness. MRI brain limited but no acute finding. TSH/Vitamin B12 and ammonia level/ influenza screen negative. - Overall mental status much improved.  We will continue with hydration, PT OT, supportive care.  Sepsis due to aspiration pneumonia: Initially with no definite infiltrates on CXR and no pyuria, unclear source. CT abd/pelvis reveals bibasilar L > R consolidation in patient with known silent and overt aspiration. Negative cultures to date. Covid-19 negative, flu negative.  - Unasyn started after discussion with patient and daughter Re: GOC on 2/24. - Monitor vital signs  Oropharyngeal dysphagia: Confirmed by MBSS with witnessed aspiration of backflowed material.  - SLP evaluations ongoing. Aspiration precautions. Chin-tuck difficult w/cervical  spine disease/s/p fusion.  Venous stasis hyperpigmentation: No wounds. - WOC consulted, moisturize area, redistribution as tolerated.   Anemia of chronic disease, thrombocytopenia:  - Stable-to-improved. Ok to continue lovenox  Conjunctivitis: Mild.  - Erythromycin ointment  Prostate CA: s/p TURP and XRT per pt.  - Check PVR. - Continue myrbetriq  HTN: - Holding BP medications at this time.   Elevated LFTs: Right upper quadrant ultrasound unremarkable.  Acute viral hepatitis panel pending.  Arteries are slightly abnormal.   - Recheck in AM  Low prealbumin level, unspecified severity but appears to have protein calorie malnutrition:  - Protein supplementation ordered.  Mild demand myocardial ischemia, CAD: No chest pain - No inpatient interventions planned. Continue aspirin, statin  Weight loss, failure to thrive: Patient is DNR with subacute failure to thrive for about 3 months with ~30lbs weight loss.  - Palliative care consulted, appreciate their assistance. Limiting invasive interventions/work up, so ordered CT abd/pelvis due to anemia and constitutional symptoms in hopes to essentially rule out abdominal/GI tract malignancy. Agree with having palliative care follow at ALF vs. hospice.   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  - Pain control - PT/OT as much as possible, though rehabilitation potential is limited.  DVT prophylaxis: Lovenox Code Status: DNR Family Communication: Daughter by phone daily. Palliative to discuss with them further as well.  Disposition Plan: Likely to return to [facility] with palliative care services. Functional status would indicate SNF level of care is required.  Consultants:   Palliative care medicine team  Psychiatry  Procedures:   None  Antimicrobials:  Unasyn 2/24 - 2/28   Subjective: Eating breakfast without issues, no significant pain reported. He denies fever. No shortness of breath currently.    Objective: Vitals:  07/15/19 2300 07/16/19 0517 07/16/19 0600 07/16/19 0645  BP: (!) 111/56 (!) 102/49 (!) 106/57 (!) 104/48  Pulse: 74 69    Resp:  19    Temp: 99.7 F (37.6 C) 99.1 F (37.3 C)    TempSrc: Oral Oral    SpO2: 95% 94%    Weight:      Height:        Intake/Output Summary (Last 24 hours) at 07/16/2019 0919 Last data filed at 07/16/2019 [id number] Gross per 24 hour  Intake 1017.49 ml  Output 850 ml  Net 167.49 ml   Filed Weights   07/12/19 2254  Weight: 60.9 kg   Gen: Frail elderly male in no distress Pulm: Nonlabored breathing room air. Crackles at bases. CV: Regular rate and rhythm. No murmur, rub, or gallop. No JVD, no dependent edema. GI: Abdomen soft, non-tender, non-distended, with normoactive bowel sounds.  Ext: Warm, no deformities Skin: No new rashes, lesions or ulcers on visualized skin. Hyperpigmentation BL LE's stable. Neuro: Alert and mostly oriented. Conversant. No focal neurological deficits, though exam limited while eating breakfast. Psych: Judgement and insight appear fair. Mood euthymic & affect congruent. Behavior is appropriate.    Data Reviewed: I have personally reviewed following labs and imaging studies  CBC: Recent Labs  Lab 07/12/19 1819 07/13/19 0151 07/16/19 0223  WBC 7.2 5.4 5.1  NEUTROABS 5.7  --  3.4  HGB 11.9* 10.8* 11.0*  HCT 36.7* 33.3* 34.4*  MCV 95.6 94.6 96.9  PLT 107* 100* [id number]*   Basic Metabolic Panel: Recent Labs  Lab 07/12/19 1819 07/12/19 2320 07/13/19 0151 07/16/19 0223  NA 141  --  143 141  K 4.5  --  4.4 4.3  CL 106  --  111 109  CO2 27  --  24 23  GLUCOSE 78  --  74 141*  BUN 29*  --  27* 36*  CREATININE 0.79  --  0.93 0.94  CALCIUM 9.3  --  8.7* 8.5*  MG  --  1.9 1.7  --   PHOS  --  3.8 3.6  --    GFR: Estimated Creatinine Clearance: 42.6 mL/min (by C-G formula based on SCr of 0.94 mg/dL). Liver Function Tests: Recent Labs  Lab 07/12/19 1819 07/13/19 0151 07/16/19 0223  AST 54* 47* 30   ALT 61* 51* 32  ALKPHOS 76 64 65  BILITOT 0.6 0.6 0.5  PROT 6.9 6.0* 5.9*  ALBUMIN 3.1* 2.7* 2.5*   Recent Labs  Lab 07/12/19 2320  LIPASE 38   Recent Labs  Lab 07/12/19 2320  AMMONIA 34   Coagulation Profile: Recent Labs  Lab 07/12/19 2320  INR 1.2   Cardiac Enzymes: Recent Labs  Lab 07/12/19 2320  CKTOTAL 627*   BNP (last 3 results) No results for input(s): PROBNP in the last 8760 hours. HbA1C: No results for input(s): HGBA1C in the last 72 hours. CBG: No results for input(s): GLUCAP in the last 168 hours. Lipid Profile: No results for input(s): CHOL, HDL, LDLCALC, TRIG, CHOLHDL, LDLDIRECT in the last 72 hours. Thyroid Function Tests: No results for input(s): TSH, T4TOTAL, FREET4, T3FREE, THYROIDAB in the last 72 hours. Anemia Panel: No results for input(s): VITAMINB12, FOLATE, FERRITIN, TIBC, IRON, RETICCTPCT in the last 72 hours. Urine analysis:    Component Value Date/Time   COLORURINE YELLOW 07/12/2019 2009   APPEARANCEUR CLEAR 07/12/2019 2009   LABSPEC 1.018 07/12/2019 2009   PHURINE 5.0 07/12/2019 2009   GLUCOSEU NEGATIVE 07/12/2019 2009   HGBUR NEGATIVE  07/12/2019 2009   [place] NEGATIVE 07/12/2019 2009   [place] NEGATIVE 07/12/2019 2009   PROTEINUR 30 (A) 07/12/2019 2009   UROBILINOGEN 0.2 10/11/2012 1758   NITRITE NEGATIVE 07/12/2019 2009   LEUKOCYTESUR NEGATIVE 07/12/2019 2009   Recent Results (from the past 240 hour(s))  Blood Culture (routine x 2)     Status: None (Preliminary result)   Collection Time: 07/12/19  6:03 PM   Specimen: BLOOD  Result Value Ref Range Status   Specimen Description   Final    BLOOD RIGHT ANTECUBITAL Performed at [facility], [place] [address], [place], [place] [address]    Special Requests   Final    BOTTLES DRAWN AEROBIC AND ANAEROBIC Blood Culture results may not be optimal due to an excessive volume of blood received in culture bottles Performed at [place]  [address] [place], [place] [address]    Culture   Final    NO GROWTH 3 DAYS Performed at [facility], [place] [address] [place], [place] [address]    Report Status PENDING  Incomplete  Urine culture     Status: None   Collection Time: 07/12/19  8:09 PM   Specimen: In/Out Cath Urine  Result Value Ref Range Status   Specimen Description   Final    IN/OUT CATH URINE Performed at [place] [address], [place], [place] [address]    Special Requests   Final    NONE Performed at [facility], [place] [address], [place], [place] [address]    Culture   Final    NO GROWTH Performed at [facility], [place] [address] [place],  [address]    Report Status 07/13/2019 FINAL  Final  Respiratory Panel by RT PCR (Flu A&B, Covid) - Nasopharyngeal Swab     Status: None   Collection Time: 07/12/19  9:48 PM   Specimen: Nasopharyngeal Swab  Result Value Ref Range Status   SARS Coronavirus 2 by RT PCR NEGATIVE NEGATIVE Final    Comment: (NOTE) SARS-CoV-2 target nucleic acids are NOT DETECTED. The SARS-CoV-2 RNA is generally detectable in upper respiratoy specimens during the acute phase of infection. The lowest concentration of SARS-CoV-2 viral copies this assay can detect is 131 copies/mL. A negative result does not preclude SARS-Cov-2 infection and should not be used as the sole basis for treatment or other patient management decisions. A negative result may occur with  improper specimen collection/handling, submission of specimen other than nasopharyngeal swab, presence of viral mutation(s) within the areas targeted by this assay, and inadequate number of viral copies (<131 copies/mL). A negative result must be combined with clinical observations, patient history, and epidemiological information. The expected result is Negative. Fact Sheet for Patients:  PinkCheek.be Fact Sheet for Healthcare Providers:   GravelBags.it This test is not yet ap proved or cleared by the [place] FDA and  has been authorized for detection and/or diagnosis of SARS-CoV-2 by FDA under an Emergency Use Authorization (EUA). This EUA will remain  in effect (meaning this test can be used) for the duration of the COVID-19 declaration under Section 564(b)(1) of the Act, 21 U.S.C. section 360bbb-3(b)(1), unless the authorization is terminated or revoked sooner.    Influenza A by PCR NEGATIVE NEGATIVE Final   Influenza B by PCR NEGATIVE NEGATIVE Final    Comment: (NOTE) The Xpert Xpress SARS-CoV-2/FLU/RSV assay is intended as an aid in  the diagnosis of influenza from Nasopharyngeal swab  specimens and  should not be used as a sole basis for treatment. Nasal washings and  aspirates are unacceptable for Xpert Xpress SARS-CoV-2/FLU/RSV  testing. Fact Sheet for Patients: PinkCheek.be Fact Sheet for Healthcare Providers: GravelBags.it This test is not yet approved or cleared by the [place] FDA and  has been authorized for detection and/or diagnosis of SARS-CoV-2 by  FDA under an Emergency Use Authorization (EUA). This EUA will remain  in effect (meaning this test can be used) for the duration of the  Covid-19 declaration under Section 564(b)(1) of the Act, 21  U.S.C. section 360bbb-3(b)(1), unless the authorization is  terminated or revoked. Performed at [facility], [place] [address] [place], [place] [address]   Blood Culture (routine x 2)     Status: None (Preliminary result)   Collection Time: 07/12/19 11:20 PM   Specimen: BLOOD  Result Value Ref Range Status   Specimen Description BLOOD LEFT ANTECUBITAL  Final   Special Requests   Final    BOTTLES DRAWN AEROBIC ONLY Blood Culture adequate volume Performed at [place] [address] [place], [place] [address]    Culture    Final    NO GROWTH 2 DAYS Performed at [facility] [address], [place], [place] [address]    Report Status PENDING  Incomplete      Radiology Studies: CT ABDOMEN PELVIS WO CONTRAST  Result Date: 07/15/2019 CLINICAL DATA:  Symptomatic anemia. Thrombocytopenia. Weight loss. EXAM: CT ABDOMEN AND PELVIS WITHOUT CONTRAST TECHNIQUE: Multidetector CT imaging of the abdomen and pelvis was performed following the standard protocol without IV contrast. COMPARISON:  None. FINDINGS: Lower chest: Multifocal consolidation is noted in the bilateral lower lobes, lingula, partially visualized right middle lobe. There are trace bilateral pleural effusions.Advanced coronary artery calcifications are noted. The intracardiac blood pool is hypodense relative to the adjacent myocardium consistent with anemia. Hepatobiliary: The liver is normal. Normal gallbladder.There is no biliary ductal dilation. Pancreas: Normal contours without ductal dilatation. No peripancreatic fluid collection. Spleen: No splenic laceration or hematoma. Adrenals/Urinary Tract: --Adrenal glands: No adrenal hemorrhage. --Right kidney/ureter: Multiple indeterminate small exophytic nodules are noted arising from the right kidney. Statistically, these are most likely to represent benign proteinaceous or hemorrhagic cyst, however there incompletely characterized on this exam. There is a nonobstructing 2-3 mm stone in the upper pole the right kidney. There is no hydronephrosis. --Left kidney/ureter: Small indeterminate exophytic nodules are noted arising from the left kidney, suboptimally evaluated on this exam. There is no hydronephrosis. --Urinary bladder: Unremarkable. Stomach/Bowel: --Stomach/Duodenum: No hiatal hernia or other gastric abnormality. Normal duodenal course and caliber. --Small bowel: No dilatation or inflammation. --Colon: No focal abnormality. --Appendix: Normal. Vascular/Lymphatic: Atherosclerotic calcification is present  within the non-aneurysmal abdominal aorta, without hemodynamically significant stenosis. --No retroperitoneal lymphadenopathy. --No mesenteric lymphadenopathy. --No pelvic or inguinal lymphadenopathy. Reproductive: Unremarkable Other: No ascites or free air. The abdominal wall is normal. Musculoskeletal. Multilevel degenerative changes are noted throughout the visualized thoracolumbar spine. IMPRESSION: 1. Multifocal consolidation is noted at the lung bases bilaterally concerning for multifocal pneumonia or aspiration. 2. The intracardiac blood pool is hypodense relative to the adjacent myocardium consistent with anemia. Indeterminate exophytic nodules are noted arising from both kidneys. These are favored to represent proteinaceous or hemorrhagic cysts. Follow-up with a nonemergent outpatient renal ultrasound is recommended. 3. There is a nonobstructing 2-3 mm stone in the upper pole the right kidney. 4. Aortic Atherosclerosis (ICD10-I70.0). Electronically Signed   By: Jamie Kato.D.  On: 07/15/2019 16:57    Scheduled Meds: . aspirin EC  81 mg Oral Daily  . atorvastatin  10 mg Oral q1800  . enoxaparin (LOVENOX) injection  40 mg Subcutaneous Q24H  . erythromycin   Both Eyes Q8H  . feeding supplement (ENSURE ENLIVE)  237 mL Oral TID BM  . hydrocerin   Topical BID  . mirabegron ER  50 mg Oral Daily  . multivitamin with minerals  1 tablet Oral Daily   Continuous Infusions: . sodium chloride 50 mL/hr at 07/15/19 2200  . ampicillin-sulbactam (UNASYN) IV 3 g (07/16/19 0738)     LOS: 3 days   Time spent: 35 minutes.  Patrecia Pour, MD [facility] [email or web address] 07/16/2019, 9:19 AM

## 2019-07-16 NOTE — Plan of Care (Signed)
  Problem: Clinical Measurements: Goal: Respiratory complications will improve Outcome: Progressing Goal: Cardiovascular complication will be avoided Outcome: Progressing   Problem: Elimination: Goal: Will not experience complications related to urinary retention Outcome: Progressing   Problem: Pain Managment: Goal: General experience of comfort will improve Outcome: Progressing   Problem: Safety: Goal: Ability to remain free from injury will improve Outcome: Progressing   Problem: Skin Integrity: Goal: Risk for impaired skin integrity will decrease Outcome: Progressing   

## 2019-07-16 NOTE — Progress Notes (Signed)
  Speech Language Pathology Treatment: Dysphagia  Patient Details Name: Seth Mccall MRN: [id number] DOB: 03-07-1930 Today's Date: 07/16/2019 Time: 3559-7416 SLP Time Calculation (min) (ACUTE ONLY): 19 min  Assessment / Plan / Recommendation Clinical Impression  Wet voice noted at baseline and during intake.  Pt consumed all of his breakfast and he denies having issues consuming. today reports he has been having problems with secretions "for years"  advised that he speak to his daughter who is a [name] re: this issue.  Advised judicious oral care and sleeping on a wedge/elevated head to decrease secretion aspiration risk.  In addition, pt may benefit from follow up with ENT or/& neurologist as OP to determine source of excessive secretions and ascertain if intervention is warranted.    SLP showed the pt his MBS to explain suspected esophagus as primary aspiration.  Recommend small frequent meals be consumed.  Pt reports Dr Kalman Shan is his GI MD - advised follow up with him.  GI MD may examine pt's esophageal sweep loops given pt inability to had a dedicated esophagram due to his aspiration.    Further advised if pt has significant issues with secretions and right sided weakness, OP referral to a neurologist may be helpful to delineate the source given pt report of progressive difficulties.    All education completed to help pt mitigate his aspiration risk. No follow up indicated.     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  At his baseline able to ambulate with a walker but has been feeling worse since Monday for the past 6 days per MD note with poor intake, congested cough and worsening of his chronic right-sided weakness.  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  Pt  with poor po intake and congested cough with wet voice recently. Daughter is a [name] in town - SLP phoned her after evaluation and she reported pt with one week duration of pt's wet cough, choking on saliva causing fear to swallow.  She reports pt was moved to an ALF in November and since this time, he has lost 30 pounds.  Daughter reports he does not like the food there but believes he eats what he can tolerate.  Swallow evaluation ordered.      SLP Plan  All goals met       Recommendations  Diet recommendations: Dysphagia 3 (mechanical soft);Thin liquid Liquids provided via: Cup;Straw Medication Administration: Crushed with puree Supervision: Patient able to self feed Compensations: Slow rate;Small sips/bites;Minimize environmental distractions Postural Changes and/or Swallow Maneuvers: Seated upright 90 degrees;Upright 30-60 min after meal(small frequent meals)                Follow up Recommendations: Other (comment) SLP Visit Diagnosis: Dysphagia, pharyngoesophageal phase (R13.14);Dysphagia, oropharyngeal phase (R13.12) Plan: All goals met       GO                Macario Golds 07/16/2019, 1:41 PM   Kathleen Lime, MS [place] Office [phone]

## 2019-07-16 NOTE — Care Management Important Message (Signed)
Important Message  Patient Details IM Letter given to Gabriel Earing RN Case Manager to present to the Patient Name: Seth Mccall MRN: [id number] Date of Birth: 1929-10-24   Medicare Important Message Given:  Yes     Kerin Salen 07/16/2019, 2:11 PM

## 2019-07-16 NOTE — Progress Notes (Signed)
   07/16/19 0645  Vitals  BP (!) 104/48  BP Location Right Arm  BP Method Manual  Patient Position (if appropriate) Lying   RN notified Tylene Fantasia, NP per order. RN will continue to monitor

## 2019-07-16 NOTE — Progress Notes (Signed)
Physical Therapy Treatment Patient Details Name: Seth Mccall MRN: [id number] DOB: [birth year] Today's Date: 07/16/2019    History of Present Illness 86 yo male admitted with SIRS, AMS. Hx of CAD, gout, OA, prostate Ca, cervical fusion. Pt is from [facility]    PT Comments    Pt in recliner via OT this am. General transfer comment: required increased assist to rise from lower recliner level.  Used twist bed sheet placed around pt low back to pull and shift pt's weight up and forward as he was unable to offer much self assist B UE's.  NT and RN in room to assist with peri care.  Used stedy to transfer to bed.General bed mobility comments: Required Total Assist back to bed and position L side per pt request.  Per chart review, family plans for pt to return to ALF at [facility] under Palliative Care.   Follow Up Recommendations       Equipment Recommendations  None recommended by PT    Recommendations for Other Services       Precautions / Restrictions Precautions Precautions: Fall Restrictions Weight Bearing Restrictions: No    Mobility  Bed Mobility Overal bed mobility: Needs Assistance Bed Mobility: Sit to Supine     Supine to sit: Total assist;+2 for physical assistance;+2 for safety/equipment     General bed mobility comments: Required Total Assist back to bed and position L side per pt request.  Transfers Overall transfer level: Needs assistance   Transfers: Sit to/from Stand Sit to Stand: Total assist;+2 physical assistance;+2 safety/equipment         General transfer comment: required increased assist to rise from lower recliner level.  Used twist bed sheet placed around pt low back to pull and shift pt's weight up and forward as he was unable to offer much self assist B UE's.  NT and RN in room to assist with peri care.  Used stedy to transfer to bed.  Ambulation/Gait                 Stairs             Wheelchair Mobility     Modified Rankin (Stroke Patients Only)       Balance                                            Cognition Arousal/Alertness: Awake/alert Behavior During Therapy: WFL for tasks assessed/performed Overall Cognitive Status: Within Functional Limits for tasks assessed                                 General Comments: A&O. plesant, conversational, answering questions appropriately.      Exercises      General Comments        Pertinent Vitals/Pain Pain Assessment: 0-10 Pain Score: 4  Pain Location: right hand Pain Intervention(s): Limited activity within patient's tolerance;Patient requesting pain meds-RN notified    Home Living                      Prior Function            PT Goals (current goals can now be found in the care plan section) Progress towards PT goals: Progressing toward goals    Frequency    Min 2X/week  PT Plan Current plan remains appropriate    Co-evaluation              AM-PAC PT "6 Clicks" Mobility   Outcome Measure  Help needed turning from your back to your side while in a flat bed without using bedrails?: Total Help needed moving from lying on your back to sitting on the side of a flat bed without using bedrails?: Total Help needed moving to and from a bed to a chair (including a wheelchair)?: Total Help needed standing up from a chair using your arms (e.g., wheelchair or bedside chair)?: Total Help needed to walk in hospital room?: Total Help needed climbing 3-5 steps with a railing? : Total 6 Click Score: 6    End of Session Equipment Utilized During Treatment: Gait belt Activity Tolerance: Patient tolerated treatment well Patient left: in bed;with call bell/phone within reach;with bed alarm set   PT Visit Diagnosis: Muscle weakness (generalized) (M62.81);Other abnormalities of gait and mobility (R26.89)     Time: [id number] PT Time Calculation (min) (ACUTE ONLY): 19  min  Charges:  $Therapeutic Activity: 8-22 mins                     Rica Koyanagi  PTA Acute  Rehabilitation Services Pager      [phone] Office      [phone]

## 2019-07-16 NOTE — Progress Notes (Signed)
Occupational Therapy Treatment Patient Details Name: Seth Mccall MRN: [id number] DOB: 1929-08-31 Today's Date: 07/16/2019    History of present illness 86 yo male admitted with SIRS, AMS. Hx of CAD, gout, OA, prostate Ca, cervical fusion. Pt is from [facility]   OT comments  Pt progressing towards acute OT goals. Stood from EOB 2x with max +2 physical A. Unable to advance BLE at this time but was able to stand upright enough to utilize [place] for getting up to the recliner. Pt reporting RUE pain which has been going on since PTA. Supported RUE onto pillow once in recliner. Nursing notified of RUE pain and pt request for pain med. Pt with improved cognition this session, alert, pleasant and answering questions appropriately.  D/c plan remains appropriate.    Follow Up Recommendations  Home health OT;Supervision/Assistance - 24 hour;SNF(ALF preferred if they can provide support)    Equipment Recommendations  Other (comment)(defer to next venue)    Recommendations for Other Services      Precautions / Restrictions Precautions Precautions: Fall Restrictions Weight Bearing Restrictions: No       Mobility Bed Mobility Overal bed mobility: Needs Assistance Bed Mobility: Supine to Sit     Supine to sit: Max assist;+2 for physical assistance;HOB elevated;Total assist     General bed mobility comments: assist for trunk, able to reach and hold onto bed rail with LUE to assist a little with scooting hips to full EOB position. Total A to advance RLE, with LLE unweighted able to assist in advancing off EOB. Assist to fully advance BLE off bed to conserve energy for transfer.   Transfers Overall transfer level: Needs assistance Equipment used: Rolling walker (2 wheeled) Transfers: Sit to/from Stand Sit to Stand: Max assist;+2 physical assistance;From elevated surface         General transfer comment: Assist to power up and steady. Elevated bed height. Difficulty  achieving full hip extension and stood in somewhat trunk flexed position but able to stand upright enough to utilized stedy for up to recliner. Stood about 20-30 seconds on intital stand.    Balance Overall balance assessment: Needs assistance Sitting-balance support: Bilateral upper extremity supported;Feet supported Sitting balance-Leahy Scale: Poor Sitting balance - Comments: Leaning to left side and posterior intitally but improved to sitting EOB with BUE support and close min guard.    Standing balance support: Bilateral upper extremity supported Standing balance-Leahy Scale: Poor                             ADL either performed or assessed with clinical judgement   ADL Overall ADL's : Needs assistance/impaired                         Toilet Transfer: Maximal assistance;+2 for physical assistance Toilet Transfer Details (indicate cue type and reason): Stood from EOB with +2 max A. stood about 20-30 seconds. Unable to advance BLE this session. Pt sat for rest break then completed up to recliner utilizing STEDY.           General ADL Comments: Bed mobility, sit<>stand, seated rest break then STEDY to reliner this session. Pt with improved cognition today.      Vision       Perception     Praxis      Cognition Arousal/Alertness: Awake/alert Behavior During Therapy: WFL for tasks assessed/performed Overall Cognitive Status: Within Functional Limits for tasks assessed  General Comments: A&O. plesant, conversational, answering questions appropriately.        Exercises     Shoulder Instructions       General Comments      Pertinent Vitals/ Pain       Pain Assessment: Faces Faces Pain Scale: Hurts little more Pain Location: RUE, pt reports pain has been going on for awhile PTA Pain Descriptors / Indicators: Guarding;Sore Pain Intervention(s): Monitored during session  Home Living                                           Prior Functioning/Environment              Frequency  Min 2X/week        Progress Toward Goals  OT Goals(current goals can now be found in the care plan section)  Progress towards OT goals: Progressing toward goals  Acute Rehab OT Goals Patient Stated Goal: family would like pt to return to ALF OT Goal Formulation: With family Time For Goal Achievement: 07/28/19 Potential to Achieve Goals: Fair ADL Goals Pt Will Perform Grooming: with supervision;sitting Pt Will Perform Upper Body Bathing: with supervision;sitting Pt Will Perform Upper Body Dressing: with supervision;sitting Pt Will Transfer to Toilet: with mod assist;stand pivot transfer;bedside commode Pt Will Perform Toileting - Clothing Manipulation and hygiene: with mod assist;sitting/lateral leans Additional ADL Goal #1: Pt will perform bed mobility at mod A level prior to engaging in ADL  Plan Discharge plan remains appropriate    Co-evaluation                 AM-PAC OT "6 Clicks" Daily Activity     Outcome Measure   Help from another person eating meals?: A Little Help from another person taking care of personal grooming?: A Little Help from another person toileting, which includes using toliet, bedpan, or urinal?: A Lot Help from another person bathing (including washing, rinsing, drying)?: A Lot Help from another person to put on and taking off regular upper body clothing?: A Lot Help from another person to put on and taking off regular lower body clothing?: A Lot 6 Click Score: 14    End of Session Equipment Utilized During Treatment: Gait belt;Rolling walker;Other (comment)(stedy)  OT Visit Diagnosis: Unsteadiness on feet (R26.81);Other abnormalities of gait and mobility (R26.89);Muscle weakness (generalized) (M62.81);Other symptoms and signs involving cognitive function   Activity Tolerance Patient tolerated treatment well   Patient Left in  chair;with call bell/phone within reach;with chair alarm set   Nurse Communication Patient requests pain meds;Need for lift equipment        Time: 1111-1140 OT Time Calculation (min): 29 min  Charges: OT General Charges $OT Visit: 1 Visit OT Treatments $Self Care/Home Management : 23-37 mins  Tyrone Schimke, OT Acute Rehabilitation Services Pager: [phone] Office: [phone]    Hortencia Pilar 07/16/2019, 12:17 PM

## 2019-07-16 NOTE — Care Management Important Message (Signed)
Important Message  Patient Details IM Letter given to Gabriel Earing RN Case Manager to present to the Patient Name: Seth Mccall MRN: [id number] Date of Birth: 1930-03-14   Medicare Important Message Given:  Yes     Kerin Salen 07/16/2019, 2:12 PM

## 2019-07-16 NOTE — Progress Notes (Signed)
[facility]) Hospital Liaison RN note  Notified by Oletta Darter, RN with [facility] team of family request for [facility] services at ALF after discharge. Hospice eligibility has been confirmed by [facility] physician.  Spoke with daughter Ezequiel Kayser (Dr. D) to initiate education related to hospice philosophy, services and team approach to care. Daughter verbalized understanding or information given. Discharge date pending.  Please send signed and completed out of facility DNR form home with patient/family.  Patient will need prescriptions for discharge comfort medications.  No DME needs at this time.  [facility] referral center aware of the above.   Please fax completed discharge summary to [phone] when completed. Please notify [facility] when patient is ready to leave the unit at discharge at [phone] (830am-500pm) or [phone] (after 5pm or weekends).   Above information shared with Myraette with TOC team.  Please call with any hospice related questions or concerns.  Thank you.  Margaretmary Eddy, RN, BSN [facility] Liaison [phone]  [place] are on [facility]

## 2019-07-16 NOTE — TOC Progression Note (Signed)
Transition of Care [facility]) - Progression Note    Patient Details  Name: Seth Mccall MRN: [id number] Date of Birth: 21-Jul-1929  Transition of Care [facility]) CM/SW Contact  Purcell Mouton, RN Phone Number: 07/16/2019, 9:06 AM  Clinical Narrative:     [name] Seth Mccall [phone] called to request [facility] for pt. Referral given to [facility] in house rep.  Expected Discharge Plan: Assisted Living Barriers to Discharge: No Barriers Identified  Expected Discharge Plan and Services Expected Discharge Plan: Assisted Living   Discharge Planning Services: CM Consult   Living arrangements for the past 2 months: Assisted Living Facility                                       Social Determinants of Health (SDOH) Interventions    Readmission Risk Interventions No flowsheet data found.

## 2019-07-16 NOTE — Progress Notes (Signed)
RN received in handoff report that the day shift RN attempted to in and out cath the patient and met resistance while trying to advance the catheter. Day shift RN stated she was unable to drain the patients's bladder.   Upon shift change, the patient had some unmeasured urinary output. RN placed an external male catheter on the patient. RN will continue to monitor the pt's urinary output throughout the night.

## 2019-07-17 MED ORDER — SENNA 8.6 MG PO TABS
1.0000 | ORAL_TABLET | Freq: Every day | ORAL | Status: DC
  Administered 2019-07-17 – 2019-07-19 (×3): 8.6 mg via ORAL
  Filled 2019-07-17 (×3): qty 1

## 2019-07-17 MED ORDER — POLYETHYLENE GLYCOL 3350 17 G PO PACK
17.0000 g | PACK | Freq: Every day | ORAL | Status: DC
  Filled 2019-07-17: qty 1

## 2019-07-17 NOTE — Progress Notes (Signed)
Spoke with patient's daughter last PM re: goals of care and palliative support. Patient will go back to ALF. No SNF, He will do best at ALF where he can be with his wife. His debility was discussed in the context of his current level of care needs, ALF can provide support for transfers in and out of bed and with ADLs, but we considered all possible options for additional support -hospice, Home Health etc barrier to hospice care is that PT is important to both the patient and his daughter to preserve his function and for QOL with no aggressive restorative goals- but not covered under hospice. She will continue to consider options and has spoken with Dr. Konrad Dolores.  After further discussion I believe that situational depression is playing a big part in his decline.I have recommended treatment with low dose Ritalin or Mirtazapine trial.   Medical work-up is complete and he has improved since admission although very weak.. Patient should be ready for discharge - hospice v. Palliative w/ HH at ALF. Provided my contact information and will assist as needed in post-acute plan.  Lane Hacker, DO Palliative Medicine

## 2019-07-17 NOTE — Progress Notes (Signed)
[facility]  Outpatient palliative will follow at [facility]   Per discussion, family wants to try rehab.  They are aware they can contact us at any time when they are ready for hospice.  Venia Carbon RN, BSN, [facility] Liaison  [phone]

## 2019-07-17 NOTE — TOC Progression Note (Signed)
Transition of Care [facility]) - Progression Note    Patient Details  Name: Seth Mccall MRN: [id number] Date of Birth: [birth year]  Transition of Care [facility]) CM/SW Contact  Purcell Mouton, RN Phone Number: 07/17/2019, 2:29 PM  Clinical Narrative:    Pt will not discharge today. Plan to discharge to [place] [phone]. This CM spoke with [facility] admission to inform her pt would not discharge today.    Expected Discharge Plan: Assisted Living Barriers to Discharge: No Barriers Identified  Expected Discharge Plan and Services Expected Discharge Plan: Assisted Living   Discharge Planning Services: CM Consult   Living arrangements for the past 2 months: Assisted Living Facility                                       Social Determinants of Health (SDOH) Interventions    Readmission Risk Interventions No flowsheet data found.

## 2019-07-17 NOTE — Progress Notes (Signed)
PROGRESS NOTE  Seth Mccall  DOB: 1929-09-15  PCP: [name], MD [id number]  DOA: 07/12/2019 Admitted From: ALF  LOS: 4 days   Chief Complaint  Patient presents with  . Weakness   Brief narrative: Seth Mccall is an 86 y.o. male with a history of CAD, HTN, gout, prostate CA s/p XRT, cervical spine disease s/p C3-5 fusion. Patient was brought to the ED from [facility] on 2/21 for generalized weakness and altered mental status.  Work-up in the ED showed CT scan of head without any acute finding, unremarkable urinalysis, stable B12 and TSH level, clean chest x-ray, negative right upper quadrant ultrasound.   He was admitted to hospitalist medicine service for evaluation of acute encephalopathy.   During the course of hospitalization, patient started to spike fever. CT scan 2/24 showed possible aspiration pneumonia.  Subjective: Patient was seen and examined this morning.  Elderly male, lying down in bed.  Opens eyes on verbal command.  He said, 'I'm uncomfortable.'  Felt better after I repositioned him.  Denies any physical complaint at this time.  Assessment/Plan: Acute metabolic encephalopathy -Presented with worsening confusion.  Likely secondary to dehydration as well as pneumonia. -May also have subclinical dementia which might have precipitated after he was moved from his daughter's house to assisted living facility. -CT scan of head unremarkable.  MRI brain did not show any acute findings.  TSH, vitamin B12, ammonia level normal. -Mentation still remains slow.  Continue to monitor.  Generalized weakness Chronic right-sided weakness -Per patient's daughter, he has had several sessions of physical therapy and Occupational Therapy in the past without much improvement. -PT eval was obtained in this hospitalization. SNF was recommended. -Family prefers patient to go back to ALF to live with his wife.  Recurrent fever Aspiration pneumonia -Initial  chest x-ray was normal.  However CT abdomen/pelvis obtained on 2/24 showed bibasilar L > R consolidation in patient with known silent and overt aspiration. Negative cultures to date. Covid-19 negative, flu negative.  -IV Unasyn started after discussion with patient and daughter. -WBC count is normal.  -However, patient started to have fever again from last night, 100.7 T-max. -Had a long conversation with patient's daughter this afternoon. -Plan is to monitor fever next 24 hours.  If he continues to have fever, will do further work-up and/or broaden coverage of antibiotics.  Oropharyngeal dysphagia:  -Confirmed by MBSS with witnessed aspiration of backflowed material.  -SLP evaluations ongoing. Aspiration precautions. Chin-tuck difficult w/cervical spine disease/s/p fusion.  Venous stasis hyperpigmentation:  -No wounds. -Wound care consulted, moisturize area, redistribution as tolerated.   Anemia of chronic disease, thrombocytopenia:  -Stable-to-improved. Ok to continue lovenox  Prostate CA: s/p TURP and XRT per pt.  -Check PVR. -Continue myrbetriq  HTN: -Holding BP medications at this time.   Elevated LFTs:  -Right upper quadrant ultrasound unremarkable.  Low prealbumin level -unspecified severity but appears to have protein calorie malnutrition:  -protein supplementation ordered.  Mild demand myocardial ischemia, CAD: -No chest pain -No inpatient interventions planned. Continue aspirin, statin  Weight loss, failure to thrive: -Patient is DNR with subacute failure to thrive for about 3 months with ~30lbs weight loss.  -Palliative care consulted, appreciate their assistance. Limiting invasive interventions/work up, -Family is having difficult time to make a decision about palliative care/hospice.  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  -Pain control -PT/OT as much as possible, though rehabilitation potential is limited.   Body mass index is  26.22 kg/m. DVT prophylaxis:  Lovenox Antimicrobials:  IV Unasyn Fluid: Normal saline at 50 mils per hour Diet: Dysphagia 3 diet  Code Status:  DNR/DNI Mobility: Limited ability with PT Family Communication:  And long-term discharged home with patient's daughter Ms. Dasanayak. Discharge plan: It is not clear at this time.  Holding family meeting and a conference call tomorrow.  If family is agreeable.  Plan is to discharge to ALF with hospice.  Needs Covid test as well.  Consultants:  Palliative care   Antimicrobials: Anti-infectives (From admission, onward)   Start     Dose/Rate Route Frequency Ordered Stop   07/15/19 1900  Ampicillin-Sulbactam (UNASYN) 3 g in sodium chloride 0.9 % 100 mL IVPB     3 g 200 mL/hr over 30 Minutes Intravenous Every 6 hours 07/15/19 1853          Code Status: DNR   Diet Order            DIET DYS 3 Room service appropriate? Yes with Assist; Fluid consistency: Thin  Diet effective now              Infusions:  . sodium chloride 50 mL/hr at 07/15/19 2200  . ampicillin-sulbactam (UNASYN) IV 3 g (07/17/19 0813)    Scheduled Meds: . aspirin EC  81 mg Oral Daily  . atorvastatin  10 mg Oral q1800  . enoxaparin (LOVENOX) injection  40 mg Subcutaneous Q24H  . erythromycin   Both Eyes Q8H  . feeding supplement (ENSURE ENLIVE)  237 mL Oral TID BM  . hydrocerin   Topical BID  . mirabegron ER  50 mg Oral Daily  . multivitamin with minerals  1 tablet Oral Daily    PRN meds: acetaminophen **OR** acetaminophen, HYDROcodone-acetaminophen, ondansetron **OR** ondansetron (ZOFRAN) IV   Objective: Vitals:   07/17/19 0800 07/17/19 0900  BP:    Pulse:    Resp:    Temp: (!) 100.4 F (38 C) 98.1 F (36.7 C)  SpO2:      Intake/Output Summary (Last 24 hours) at 07/17/2019 1334 Last data filed at 07/17/2019 0330 Gross per 24 hour  Intake 100 ml  Output --  Net 100 ml   Filed Weights   07/12/19 2254  Weight: 60.9 kg   Weight change:  Body  mass index is 26.22 kg/m.   Physical Exam: General exam: Appears calm and comfortable.  Not in physical distress Skin: No rashes, lesions or ulcers. HEENT: Atraumatic, normocephalic, supple neck, no obvious bleeding Lungs: Clear to auscultation bilaterally CVS: Regular rate and rhythm, no murmur GI/Abd soft, nontender, nondistended, bowel syndrome CNS: Opens eyes on verbal command, slow to respond, Psychiatry: Depressed look Extremities: No pedal edema, no calf tenderness  Data Review: I have personally reviewed the laboratory data and studies available.  Recent Labs  Lab 07/12/19 1819 07/13/19 0151 07/16/19 0223  WBC 7.2 5.4 5.1  NEUTROABS 5.7  --  3.4  HGB 11.9* 10.8* 11.0*  HCT 36.7* 33.3* 34.4*  MCV 95.6 94.6 96.9  PLT 107* 100* 145*   Recent Labs  Lab 07/12/19 1819 07/12/19 2320 07/13/19 0151 07/16/19 0223  NA 141  --  143 141  K 4.5  --  4.4 4.3  CL 106  --  111 109  CO2 27  --  24 23  GLUCOSE 78  --  74 141*  BUN 29*  --  27* 36*  CREATININE 0.79  --  0.93 0.94  CALCIUM 9.3  --  8.7* 8.5*  MG  --  1.9  1.7  --   PHOS  --  3.8 3.6  --     Signed, Terrilee Croak, MD [facility] Pager: [phone] (Secure Chat preferred). 07/17/2019

## 2019-07-17 NOTE — Progress Notes (Signed)
Patient is very sleepy and tired throughout the day. Plan of care and upcoming discharge plans were discussed with daughterRip Harbour.  She asked that additional family members be contacted for the virtual meeting that will take place on 2/27 at 11:00.  Rip Harbour [phone] Hasitha [phone] Pradepa [phone]

## 2019-07-18 LAB — BASIC METABOLIC PANEL
Anion gap: 8 (ref 5–15)
BUN: 31 mg/dL — ABNORMAL HIGH (ref 8–23)
CO2: 26 mmol/L (ref 22–32)
Calcium: 8.1 mg/dL — ABNORMAL LOW (ref 8.9–10.3)
Chloride: 109 mmol/L (ref 98–111)
Creatinine, Ser: 0.94 mg/dL (ref 0.61–1.24)
GFR calc Af Amer: >60 mL/min (ref >60)
GFR calc non Af Amer: >60 mL/min (ref >60)
Glucose, Bld: 95 mg/dL (ref 70–99)
Potassium: 4.2 mmol/L (ref 3.5–5.1)
Sodium: 143 mmol/L (ref 135–145)

## 2019-07-18 LAB — CBC WITH DIFFERENTIAL/PLATELET
Abs Immature Granulocytes: 0.03 10*3/uL (ref 0.00–0.07)
Basophils Absolute: 0 10*3/uL (ref 0.0–0.1)
Basophils Relative: 0 %
Eosinophils Absolute: 0.2 10*3/uL (ref 0.0–0.5)
Eosinophils Relative: 2 %
HCT: 30.4 % — ABNORMAL LOW (ref 39.0–52.0)
Hemoglobin: 9.7 g/dL — ABNORMAL LOW (ref 13.0–17.0)
Immature Granulocytes: 0 %
Lymphocytes Relative: 18 %
Lymphs Abs: 1.3 10*3/uL (ref 0.7–4.0)
MCH: 30.8 pg (ref 26.0–34.0)
MCHC: 31.9 g/dL (ref 30.0–36.0)
MCV: 96.5 fL (ref 80.0–100.0)
Monocytes Absolute: 0.8 10*3/uL (ref 0.1–1.0)
Monocytes Relative: 12 %
Neutro Abs: 4.5 10*3/uL (ref 1.7–7.7)
Neutrophils Relative %: 68 %
Platelets: 200 10*3/uL (ref 150–400)
RBC: 3.15 MIL/uL — ABNORMAL LOW (ref 4.22–5.81)
RDW: 15.7 % — ABNORMAL HIGH (ref 11.5–15.5)
WBC: 6.8 10*3/uL (ref 4.0–10.5)
nRBC: 0 % (ref 0.0–0.2)

## 2019-07-18 LAB — CULTURE, BLOOD (ROUTINE X 2)
Culture: NO GROWTH
Special Requests: ADEQUATE

## 2019-07-18 NOTE — Progress Notes (Signed)
Pharmacy Antibiotic Note  Seth Mccall is a 86 y.o. male admitted on 07/12/2019 for acute encephalopathy. Pharmacy has been consulted for Unasyn dosing for possible aspiration pneumonia.  07/18/2019 Scr 0.94, CrCl ~ 16mls/min WBC WNL Afebrile today  Plan: Continue Unasyn 3g q6h Monitor clinical picture, renal function F/U C&S, abx deescalation / LOT   Height: 5' (152.4 cm) Weight: 134 lb 4.2 oz (60.9 kg) IBW/kg (Calculated) : 50  Temp (24hrs), Avg:99 F (37.2 C), Min:98 F (36.7 C), Max:101.2 F (38.4 C)  Recent Labs  Lab 07/12/19 1819 07/12/19 2320 07/13/19 0151 07/16/19 0223 07/18/19 0820  WBC 7.2  --  5.4 5.1 6.8  CREATININE 0.79  --  0.93 0.94 0.94  LATICACIDVEN 1.1 0.8  --   --   --     Estimated Creatinine Clearance: 42.6 mL/min (by C-G formula based on SCr of 0.94 mg/dL).    Allergies  Allergen Reactions  . Pravastatin     Antimicrobials this admission: Unasyn 2/24 >>   Microbiology results: 2/21 BCx: NGF 2/21 UCx: NGF 2/21 Respiratory panel: neg  Thank you for allowing pharmacy to be a part of this patient's care.  Dolly Rias RPh 07/18/2019, 1:22 PM

## 2019-07-18 NOTE — Progress Notes (Signed)
PROGRESS NOTE  Seth Mccall  DOB: [birth year]  PCP: [name], MD BX:[id number]  DOA: 07/12/2019 Admitted From: ALF  LOS: 5 days   Chief Complaint  Patient presents with  . Weakness   Brief narrative: Seth Mccall is an 86 y.o. male with a history of CAD, HTN, gout, prostate CA s/p XRT, cervical spine disease s/p C3-5 fusion. Patient was brought to the ED from [facility] on 2/21 for generalized weakness and altered mental status.  Work-up in the ED showed CT scan of head without any acute finding, unremarkable urinalysis, stable B12 and TSH level, clean chest x-ray, negative right upper quadrant ultrasound.   He was admitted to hospitalist medicine service for evaluation of acute encephalopathy.   During the course of hospitalization, patient started to spike fever. CT scan 2/24 showed possible aspiration pneumonia.  Subjective: Patient was seen and examined this morning.  Wide-awake, alert, oriented x3 except for date.  Able to participate in a meaningful conversation today.  I talked to his daughter on the phone from his room.  Patient strongly verbalized that he does not want hospice care at this time.  Assessment/Plan: Acute metabolic encephalopathy -Presented with worsening confusion.  Likely secondary to dehydration as well as pneumonia. -May also have subclinical dementia which might have precipitated after he was moved from his apartment to assisted living facility. -CT scan of head unremarkable.  MRI brain did not show any acute findings.  TSH, vitamin B12, ammonia level normal. -Mental status much better this morning.  Generalized weakness Chronic right-sided weakness -Per patient's daughter, he has had several sessions of physical therapy and Occupational Therapy in the past without much improvement. -PT eval was obtained in this hospitalization. SNF was recommended. -Family prefers patient to go back to ALF to live with his wife.  Recurrent  fever Aspiration pneumonia -Initial chest x-ray on admission was normal.  However CT abdomen/pelvis obtained on 2/24 showed bibasilar L > R consolidation in patient with known silent and overt aspiration. Negative cultures to date. Covid-19 negative, flu negative.  -IV Unasyn started after discussion with patient and daughter. -However, patient started to have fever again, T-max one 101.2 yesterday afternoon -WBC count continues to remain normal. -We will monitor for next 24 hours.  If no fever by tomorrow.  Family agrees to discharge to ALF tomorrow.  Needs Covid test on the day of discharge.  Oropharyngeal dysphagia:  -Confirmed by MBSS with witnessed aspiration of backflowed material.  -SLP evaluations ongoing. Aspiration precautions. Chin-tuck difficult w/cervical spine disease/s/p fusion.  Cardiovascular issues: HTN, CAD, mild demand ischemia -Currently no chest pain.  Heart rate and blood pressure normal. -Home meds include aspirin, statin, metoprolol and lisinopril. -Continue aspirin and statin.  -Metoprolol and lisinopril remain on hold.  Resume blood pressure and heart rate is elevated.  Prostate CA: s/p TURP and XRT per pt.  -Continue myrbetriq  Weight loss, failure to thrive: Low prealbumin level -~30lbs weight loss in 3 months.  Nutrition consult appreciated.  Unspecified severity but appears to have protein calorie malnutrition:  -protein supplementation ordered.  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  Progressively declining mobility -Pain control -Per family, patient had an physical therapy for several weeks without much improvement.  End-of-life discussions Patient had a poor mental status until yesterday.  We have had long conversations between palliative care, primary service and patient's daughter.  Interestingly, this morning, patient's mental status is much improved.  He is consciously able to make a decision not  to get hospice services at  this time.  He wants to return to assisted living facility with physical therapy and to live with his wife.   I had another conversation with patient's daughter Ms Durwin Reges this morning.  She states that when patient was feeling weak at ALF, he verbalized, 'I don't want to live like this.' However, on a better day like today, patient seems hopeful.  I advised patient's daughter to have a family meeting between patient, his wife and all his kids to come up with a long-term plan.  This can happen once he was discharged to ALF. If needed, family can get hospice/palliative care consultation as an outpatient as well.  Palliative Dr. Hilma Favors has been following while the patient is in the hospital.  DVT prophylaxis:  Lovenox Antimicrobials:  IV Unasyn. Fluid: Normal saline at 50 mils per hour Diet: Dysphagia 3 diet  Code Status:  DNR/DNI Mobility: Limited ability with PT Family Communication:  See above  discharge plan:  If no fever, plan to discharge back to ALF tomorrow.  Needs Covid test in the morning.  Consultants:  Palliative care   Antimicrobials: Anti-infectives (From admission, onward)   Start     Dose/Rate Route Frequency Ordered Stop   07/15/19 1900  Ampicillin-Sulbactam (UNASYN) 3 g in sodium chloride 0.9 % 100 mL IVPB     3 g 200 mL/hr over 30 Minutes Intravenous Every 6 hours 07/15/19 1853          Code Status: DNR   Diet Order            DIET DYS 3 Room service appropriate? Yes with Assist; Fluid consistency: Thin  Diet effective now              Infusions:  . sodium chloride 50 mL/hr at 07/18/19 0500  . ampicillin-sulbactam (UNASYN) IV 3 g (07/18/19 1405)    Scheduled Meds: . aspirin EC  81 mg Oral Daily  . atorvastatin  10 mg Oral q1800  . enoxaparin (LOVENOX) injection  40 mg Subcutaneous Q24H  . erythromycin   Both Eyes Q8H  . feeding supplement (ENSURE ENLIVE)  237 mL Oral TID BM  . hydrocerin   Topical BID  . mirabegron ER  50 mg Oral Daily  .  multivitamin with minerals  1 tablet Oral Daily  . polyethylene glycol  17 g Oral Daily  . senna  1 tablet Oral Daily    PRN meds: acetaminophen **OR** acetaminophen, HYDROcodone-acetaminophen, ondansetron **OR** ondansetron (ZOFRAN) IV   Objective: Vitals:   07/18/19 0004 07/18/19 0418  BP:  129/79  Pulse:  75  Resp:  18  Temp: 98 F (36.7 C) 98.6 F (37 C)  SpO2:  94%    Intake/Output Summary (Last 24 hours) at 07/18/2019 1442 Last data filed at 07/18/2019 0957 Gross per 24 hour  Intake 940 ml  Output 950 ml  Net -10 ml   Filed Weights   07/12/19 2254  Weight: 60.9 kg   Weight change:  Body mass index is 26.22 kg/m.   Physical Exam: General exam: Appears calm and comfortable.  Not in physical distress Skin: No rashes, lesions or ulcers. HEENT: Atraumatic, normocephalic, supple neck, no obvious bleeding Lungs: Clear to auscultation bilaterally.  Coughs on deep breathing CVS: Regular rate and rhythm, no murmur GI/Abd soft, nontender, nondistended, bowel syndrome CNS: Alert, awake, oriented to place and person, knows it is February 2021.  Able to follow commands.  Able to participate in meaningful conversation Psychiatry:  Mood appropriate Extremities: No pedal edema, no calf tenderness  Data Review: I have personally reviewed the laboratory data and studies available.  Recent Labs  Lab 07/12/19 1819 07/13/19 0151 07/16/19 0223 07/18/19 0820  WBC 7.2 5.4 5.1 6.8  NEUTROABS 5.7  --  3.4 4.5  HGB 11.9* 10.8* 11.0* 9.7*  HCT 36.7* 33.3* 34.4* 30.4*  MCV 95.6 94.6 96.9 96.5  PLT 107* 100* 145* 200   Recent Labs  Lab 07/12/19 1819 07/12/19 2320 07/13/19 0151 07/16/19 0223 07/18/19 0820  NA 141  --  143 141 143  K 4.5  --  4.4 4.3 4.2  CL 106  --  111 109 109  CO2 27  --  24 23 26   GLUCOSE 78  --  74 141* 95  BUN 29*  --  27* 36* 31*  CREATININE 0.79  --  0.93 0.94 0.94  CALCIUM 9.3  --  8.7* 8.5* 8.1*  MG  --  1.9 1.7  --   --   PHOS  --  3.8 3.6   --   --     Signed, Terrilee Croak, MD [facility] Pager: [phone] (Secure Chat preferred). 07/18/2019

## 2019-07-19 LAB — CBC WITH DIFFERENTIAL/PLATELET
Abs Immature Granulocytes: 0.06 10*3/uL (ref 0.00–0.07)
Basophils Absolute: 0 10*3/uL (ref 0.0–0.1)
Basophils Relative: 0 %
Eosinophils Absolute: 0.2 10*3/uL (ref 0.0–0.5)
Eosinophils Relative: 2 %
HCT: 30.1 % — ABNORMAL LOW (ref 39.0–52.0)
Hemoglobin: 9.6 g/dL — ABNORMAL LOW (ref 13.0–17.0)
Immature Granulocytes: 1 %
Lymphocytes Relative: 16 %
Lymphs Abs: 1.4 10*3/uL (ref 0.7–4.0)
MCH: 31 pg (ref 26.0–34.0)
MCHC: 31.9 g/dL (ref 30.0–36.0)
MCV: 97.1 fL (ref 80.0–100.0)
Monocytes Absolute: 0.8 10*3/uL (ref 0.1–1.0)
Monocytes Relative: 9 %
Neutro Abs: 6.3 10*3/uL (ref 1.7–7.7)
Neutrophils Relative %: 72 %
Platelets: 226 10*3/uL (ref 150–400)
RBC: 3.1 MIL/uL — ABNORMAL LOW (ref 4.22–5.81)
RDW: 15.4 % (ref 11.5–15.5)
WBC: 8.8 10*3/uL (ref 4.0–10.5)
nRBC: 0 % (ref 0.0–0.2)

## 2019-07-19 LAB — BASIC METABOLIC PANEL
Anion gap: 7 (ref 5–15)
BUN: 35 mg/dL — ABNORMAL HIGH (ref 8–23)
CO2: 26 mmol/L (ref 22–32)
Calcium: 8 mg/dL — ABNORMAL LOW (ref 8.9–10.3)
Chloride: 111 mmol/L (ref 98–111)
Creatinine, Ser: 0.9 mg/dL (ref 0.61–1.24)
GFR calc Af Amer: >60 mL/min (ref >60)
GFR calc non Af Amer: >60 mL/min (ref >60)
Glucose, Bld: 84 mg/dL (ref 70–99)
Potassium: 4.4 mmol/L (ref 3.5–5.1)
Sodium: 144 mmol/L (ref 135–145)

## 2019-07-19 MED ORDER — AMOXICILLIN-POT CLAVULANATE 500-125 MG PO TABS: 1.0000 | ORAL_TABLET | Freq: Two times a day (BID) | ORAL | 0 refills | Status: AC

## 2019-07-19 MED ORDER — SACCHAROMYCES BOULARDII 250 MG PO CAPS: 250.0000 mg | ORAL_CAPSULE | Freq: Two times a day (BID) | ORAL | 0 refills | Status: AC

## 2019-07-19 MED ORDER — HYDROCODONE-ACETAMINOPHEN 5-325 MG PO TABS: 1.0000 | ORAL_TABLET | ORAL | 0 refills | Status: DC | PRN

## 2019-07-19 MED ORDER — HYDROCODONE-ACETAMINOPHEN 5-325 MG PO TABS: 1.0000 | ORAL_TABLET | ORAL | 0 refills | Status: AC | PRN

## 2019-07-19 NOTE — Progress Notes (Signed)
CSW spoke to [place] at [facility] at ph: [phone] stating the patient has been offered a bed for return and has been accepted and that the pt can arrive on 2/28.  The pt's accepting doctor is ALF.  The room number will be 35.  The number for report is [phone].  Per [name] no FL-2 will be needed and D/C summary and negative COVID test can be sent with the pt to the facility.  CSW will update RN and EDP.  Alphonse Guild. Niyanna Asch, Latanya Presser, LCAS Clinical Social Worker Ph: [phone]

## 2019-07-19 NOTE — Progress Notes (Signed)
Manufacturing engineer (ACC)  Family called [facility] Medical Director and advised that they would like hospice support back at [facility].    ACC will see them in the home [facility]) tomorrow.  Venia Carbon RN, BSN, [facility] Liaison  [phone]

## 2019-07-19 NOTE — Progress Notes (Signed)
CSW ]will bring D/C packet to RN now and schedule PTAR for 5pm at RN's request.  Pt's negative COVID test result is with D/C packet per Rebecca's request at [facility].  3:31 PM PTAR scheduled for 5pm.  D/C packet going to pt's RN now.  CSW will continue to follow for D/C needs.  Alphonse Guild. Rashad Obeid, LCSW, LCAS, CSI Transitions of Care Clinical Social Worker Care Coordination Department Ph: [phone]

## 2019-07-19 NOTE — Discharge Summary (Signed)
Left maxillary sinus retention cyst or polyp. Bilateral lens replacements. Other: None. IMPRESSION: Suboptimal evaluation due to artifact.  No acute infarction. Electronically Signed   By: Macy Mis M.D.   On: 07/13/2019 15:07   MR Cervical Spine Wo Contrast  Result Date: 07/13/2019 CLINICAL DATA:  Right-sided weakness EXAM: MRI CERVICAL SPINE WITHOUT CONTRAST TECHNIQUE: Multiplanar, multisequence MR imaging of the cervical spine was performed. No intravenous contrast was administered. COMPARISON:  2014 FINDINGS: Alignment: Stable. Vertebrae: Postoperative changes of prior anterior fusion at C3-C5. Hardware is not well evaluated and there is associated susceptibility artifact. No substantial marrow edema. No suspicious osseous lesion. Cord: Abnormal signal is again identified at C4 and C5 levels reflecting myelomalacia. Posterior  Fossa, vertebral arteries, paraspinal tissues: Subcentimeter left thyroid nodule. Disc levels: C2-C3:  Disc bulge.  No significant canal or foraminal stenosis. C3-C4: Operative level. Endplate osteophytes particularly at the C4 level again causing moderate to severe canal stenosis with flattening of the ventral cord. Mild foraminal stenosis is similar. C4-C5: Operative level. Endplate osteophytes and facet and uncovertebral hypertrophy. Moderate to marked canal canal stenosis. Left greater than right moderate to marked foraminal stenosis. Appearance is similar. C5-C6: Endplate osteophytes and facet and uncovertebral hypertrophy. Mild canal and foraminal stenosis. Appearance is similar. C6-C7: Bridging endplate osteophytes and left greater than right facet and uncovertebral hypertrophy. No canal stenosis. Probable mild foraminal stenosis. Appearance is similar. C7-T1: Disc bulge, endplate osteophytes, and facet hypertrophy. No canal stenosis. Mild foraminal stenosis. Appearance is similar. IMPRESSION: Operative and degenerative changes as detailed above. Suboptimal stenosis evaluation due to artifact. However, appearance is similar to the prior study. Canal stenosis remains greatest at C3-C4 and C4-C5. Foraminal stenosis greatest at C4-C5. Stable abnormal cord signal at C4 and C5 reflecting myelomalacia. Electronically Signed   By: Macy Mis M.D.   On: 07/13/2019 15:00   DG Chest Port 1 View  Result Date: 07/12/2019 CLINICAL DATA:  Hypothermia EXAM: PORTABLE CHEST 1 VIEW COMPARISON:  08/21/2017 FINDINGS: The heart size and mediastinal contours are within normal limits. Both lungs are clear. The visualized skeletal structures are unremarkable. IMPRESSION: No active disease. Electronically Signed   By: Ulyses Jarred M.D.   On: 07/12/2019 19:01   DG Swallowing Func-Speech Pathology  Result Date: 07/13/2019 Objective Swallowing Evaluation: Type of Study: MBS-Modified Barium Swallow Study  Patient Details  Name: Seth Mccall MRN: [id number] Date of Birth: February 16, 1930 Today's Date: 07/13/2019 Time: SLP Start Time (ACUTE ONLY): 1310 -SLP Stop Time (ACUTE ONLY): 1339 SLP Time Calculation (min) (ACUTE ONLY): 29 min Past Medical History: Past Medical History: Diagnosis Date . Arthritis  . Complication of anesthesia   limited movement in neck because of fusion . Gout  . Hypertension  . Prostate cancer [facility])  Past Surgical History: Past Surgical History: Procedure Laterality Date . BACK SURGERY  2005  fusion of C3, C4, and C5 . CATARACT EXTRACTION Bilateral  . CORONARY ATHERECTOMY N/A 08/12/2017  Procedure: CORONARY ATHERECTOMY;  Surgeon: Adrian Prows, MD;  Location: [place] [facility];  Service: Cardiovascular;  Laterality: N/A; . CORONARY STENT INTERVENTION N/A 08/12/2017  Procedure: CORONARY STENT INTERVENTION;  Surgeon: Adrian Prows, MD;  Location: [place] [facility];  Service: Cardiovascular;  Laterality: N/A; . GREEN LIGHT LASER TURP (TRANSURETHRAL RESECTION OF PROSTATE N/A 10/10/2012  Procedure: GREEN LIGHT LASER TURP (TRANSURETHRAL RESECTION OF PROSTATE;  Surgeon: Claybon Jabs, MD;  Location: [facility];  Service: Urology;  Laterality: N/A; . LEFT HEART CATH AND CORONARY ANGIOGRAPHY N/A 08/12/2017  Procedure: LEFT  Left maxillary sinus retention cyst or polyp. Bilateral lens replacements. Other: None. IMPRESSION: Suboptimal evaluation due to artifact.  No acute infarction. Electronically Signed   By: Macy Mis M.D.   On: 07/13/2019 15:07   MR Cervical Spine Wo Contrast  Result Date: 07/13/2019 CLINICAL DATA:  Right-sided weakness EXAM: MRI CERVICAL SPINE WITHOUT CONTRAST TECHNIQUE: Multiplanar, multisequence MR imaging of the cervical spine was performed. No intravenous contrast was administered. COMPARISON:  2014 FINDINGS: Alignment: Stable. Vertebrae: Postoperative changes of prior anterior fusion at C3-C5. Hardware is not well evaluated and there is associated susceptibility artifact. No substantial marrow edema. No suspicious osseous lesion. Cord: Abnormal signal is again identified at C4 and C5 levels reflecting myelomalacia. Posterior  Fossa, vertebral arteries, paraspinal tissues: Subcentimeter left thyroid nodule. Disc levels: C2-C3:  Disc bulge.  No significant canal or foraminal stenosis. C3-C4: Operative level. Endplate osteophytes particularly at the C4 level again causing moderate to severe canal stenosis with flattening of the ventral cord. Mild foraminal stenosis is similar. C4-C5: Operative level. Endplate osteophytes and facet and uncovertebral hypertrophy. Moderate to marked canal canal stenosis. Left greater than right moderate to marked foraminal stenosis. Appearance is similar. C5-C6: Endplate osteophytes and facet and uncovertebral hypertrophy. Mild canal and foraminal stenosis. Appearance is similar. C6-C7: Bridging endplate osteophytes and left greater than right facet and uncovertebral hypertrophy. No canal stenosis. Probable mild foraminal stenosis. Appearance is similar. C7-T1: Disc bulge, endplate osteophytes, and facet hypertrophy. No canal stenosis. Mild foraminal stenosis. Appearance is similar. IMPRESSION: Operative and degenerative changes as detailed above. Suboptimal stenosis evaluation due to artifact. However, appearance is similar to the prior study. Canal stenosis remains greatest at C3-C4 and C4-C5. Foraminal stenosis greatest at C4-C5. Stable abnormal cord signal at C4 and C5 reflecting myelomalacia. Electronically Signed   By: Macy Mis M.D.   On: 07/13/2019 15:00   DG Chest Port 1 View  Result Date: 07/12/2019 CLINICAL DATA:  Hypothermia EXAM: PORTABLE CHEST 1 VIEW COMPARISON:  08/21/2017 FINDINGS: The heart size and mediastinal contours are within normal limits. Both lungs are clear. The visualized skeletal structures are unremarkable. IMPRESSION: No active disease. Electronically Signed   By: Ulyses Jarred M.D.   On: 07/12/2019 19:01   DG Swallowing Func-Speech Pathology  Result Date: 07/13/2019 Objective Swallowing Evaluation: Type of Study: MBS-Modified Barium Swallow Study  Patient Details  Name: Seth Mccall MRN: [id number] Date of Birth: February 16, 1930 Today's Date: 07/13/2019 Time: SLP Start Time (ACUTE ONLY): 1310 -SLP Stop Time (ACUTE ONLY): 1339 SLP Time Calculation (min) (ACUTE ONLY): 29 min Past Medical History: Past Medical History: Diagnosis Date . Arthritis  . Complication of anesthesia   limited movement in neck because of fusion . Gout  . Hypertension  . Prostate cancer [facility])  Past Surgical History: Past Surgical History: Procedure Laterality Date . BACK SURGERY  2005  fusion of C3, C4, and C5 . CATARACT EXTRACTION Bilateral  . CORONARY ATHERECTOMY N/A 08/12/2017  Procedure: CORONARY ATHERECTOMY;  Surgeon: Adrian Prows, MD;  Location: [place] [facility];  Service: Cardiovascular;  Laterality: N/A; . CORONARY STENT INTERVENTION N/A 08/12/2017  Procedure: CORONARY STENT INTERVENTION;  Surgeon: Adrian Prows, MD;  Location: [place] [facility];  Service: Cardiovascular;  Laterality: N/A; . GREEN LIGHT LASER TURP (TRANSURETHRAL RESECTION OF PROSTATE N/A 10/10/2012  Procedure: GREEN LIGHT LASER TURP (TRANSURETHRAL RESECTION OF PROSTATE;  Surgeon: Claybon Jabs, MD;  Location: [facility];  Service: Urology;  Laterality: N/A; . LEFT HEART CATH AND CORONARY ANGIOGRAPHY N/A 08/12/2017  Procedure: LEFT  Left maxillary sinus retention cyst or polyp. Bilateral lens replacements. Other: None. IMPRESSION: Suboptimal evaluation due to artifact.  No acute infarction. Electronically Signed   By: Macy Mis M.D.   On: 07/13/2019 15:07   MR Cervical Spine Wo Contrast  Result Date: 07/13/2019 CLINICAL DATA:  Right-sided weakness EXAM: MRI CERVICAL SPINE WITHOUT CONTRAST TECHNIQUE: Multiplanar, multisequence MR imaging of the cervical spine was performed. No intravenous contrast was administered. COMPARISON:  2014 FINDINGS: Alignment: Stable. Vertebrae: Postoperative changes of prior anterior fusion at C3-C5. Hardware is not well evaluated and there is associated susceptibility artifact. No substantial marrow edema. No suspicious osseous lesion. Cord: Abnormal signal is again identified at C4 and C5 levels reflecting myelomalacia. Posterior  Fossa, vertebral arteries, paraspinal tissues: Subcentimeter left thyroid nodule. Disc levels: C2-C3:  Disc bulge.  No significant canal or foraminal stenosis. C3-C4: Operative level. Endplate osteophytes particularly at the C4 level again causing moderate to severe canal stenosis with flattening of the ventral cord. Mild foraminal stenosis is similar. C4-C5: Operative level. Endplate osteophytes and facet and uncovertebral hypertrophy. Moderate to marked canal canal stenosis. Left greater than right moderate to marked foraminal stenosis. Appearance is similar. C5-C6: Endplate osteophytes and facet and uncovertebral hypertrophy. Mild canal and foraminal stenosis. Appearance is similar. C6-C7: Bridging endplate osteophytes and left greater than right facet and uncovertebral hypertrophy. No canal stenosis. Probable mild foraminal stenosis. Appearance is similar. C7-T1: Disc bulge, endplate osteophytes, and facet hypertrophy. No canal stenosis. Mild foraminal stenosis. Appearance is similar. IMPRESSION: Operative and degenerative changes as detailed above. Suboptimal stenosis evaluation due to artifact. However, appearance is similar to the prior study. Canal stenosis remains greatest at C3-C4 and C4-C5. Foraminal stenosis greatest at C4-C5. Stable abnormal cord signal at C4 and C5 reflecting myelomalacia. Electronically Signed   By: Macy Mis M.D.   On: 07/13/2019 15:00   DG Chest Port 1 View  Result Date: 07/12/2019 CLINICAL DATA:  Hypothermia EXAM: PORTABLE CHEST 1 VIEW COMPARISON:  08/21/2017 FINDINGS: The heart size and mediastinal contours are within normal limits. Both lungs are clear. The visualized skeletal structures are unremarkable. IMPRESSION: No active disease. Electronically Signed   By: Ulyses Jarred M.D.   On: 07/12/2019 19:01   DG Swallowing Func-Speech Pathology  Result Date: 07/13/2019 Objective Swallowing Evaluation: Type of Study: MBS-Modified Barium Swallow Study  Patient Details  Name: Seth Mccall MRN: [id number] Date of Birth: February 16, 1930 Today's Date: 07/13/2019 Time: SLP Start Time (ACUTE ONLY): 1310 -SLP Stop Time (ACUTE ONLY): 1339 SLP Time Calculation (min) (ACUTE ONLY): 29 min Past Medical History: Past Medical History: Diagnosis Date . Arthritis  . Complication of anesthesia   limited movement in neck because of fusion . Gout  . Hypertension  . Prostate cancer [facility])  Past Surgical History: Past Surgical History: Procedure Laterality Date . BACK SURGERY  2005  fusion of C3, C4, and C5 . CATARACT EXTRACTION Bilateral  . CORONARY ATHERECTOMY N/A 08/12/2017  Procedure: CORONARY ATHERECTOMY;  Surgeon: Adrian Prows, MD;  Location: [place] [facility];  Service: Cardiovascular;  Laterality: N/A; . CORONARY STENT INTERVENTION N/A 08/12/2017  Procedure: CORONARY STENT INTERVENTION;  Surgeon: Adrian Prows, MD;  Location: [place] [facility];  Service: Cardiovascular;  Laterality: N/A; . GREEN LIGHT LASER TURP (TRANSURETHRAL RESECTION OF PROSTATE N/A 10/10/2012  Procedure: GREEN LIGHT LASER TURP (TRANSURETHRAL RESECTION OF PROSTATE;  Surgeon: Claybon Jabs, MD;  Location: [facility];  Service: Urology;  Laterality: N/A; . LEFT HEART CATH AND CORONARY ANGIOGRAPHY N/A 08/12/2017  Procedure: LEFT  Physician Discharge Summary  Seth Mccall [id number] DOB: 04-16-1930 DOA: 07/12/2019  PCP: [name], MD  Admit date: 07/12/2019 Discharge date: 07/19/2019  Admitted From: ALF Discharge disposition: Back to ALF with hospice services   Code Status: DNR  Diet Recommendation: Dysphagia 3 diet  Recommendations for Outpatient Follow-Up:   1. Follow-up with palliative care/hospice services  Discharge Diagnosis:   Active Problems:   Malignant neoplasm of prostate (HCC)   SIRS (systemic inflammatory response syndrome) (HCC)   Acute metabolic encephalopathy   Elevated LFTs   Acute delirium   Acute encephalopathy   Unintentional weight loss   Anemia   Protein-calorie malnutrition ([place])  History of Present Illness / Brief narrative:  Seth Mccall [id number] y.o.malewith a history of CAD, HTN, gout, prostate CA s/p XRT, cervical spine disease s/p C3-5 fusion. Patient was brought to the ED from [facility] on 2/21 for generalized weakness and altered mental status.  Work-up in the ED showed CT scan of head without any acute finding, unremarkable urinalysis, stable B12 and TSH level, clean chest x-ray, negative right upper quadrant ultrasound.   He was admitted to hospitalist medicine service for evaluation of acute encephalopathy.   During the course of hospitalization, patient started to spike fever. CT scan 2/24 showed possible aspiration pneumonia.  Hospital Course:  Acute metabolic encephalopathy -Presented with worsening confusion.  Likely secondary to dehydration as well as pneumonia. -May also have subclinical dementia which might have precipitated after he was moved from his apartment to assisted living facility. -CT scan of head unremarkable.  MRI brain did not show any acute findings.  TSH, vitamin B12, ammonia level normal. -Mental status has been fluctuating.  Yesterday morning he was in a good mood and was able to have a normal conversation.   Apparently last night, patient was hallucinating and screaming, calling his family.  His mental status is somewhat better this morning but emotionally labile.  Generalized weakness Chronic right-sided weakness -Per patient's daughter, he has had several sessions of physical therapy and occupational therapy in the past without much improvement. -PT eval was obtained in this hospitalization. SNF was recommended. -Family prefers patient to go back to ALF to live with his wife.   Recurrent fever Aspiration pneumonia -Initial chest x-ray on admission was normal.  However CT abdomen/pelvis obtained on 2/24 showed bibasilar L > R consolidation in patient with known silent and overt aspiration. Negative cultures to date. Covid-19 negative, flu negative. -IV Unasyn was started after discussion with patient and daughter. -However, in last 72 hours, patient has had episodic temperature rise up to 100.4 without leukocytosis.  He has remained on IV Unasyn in this duration.   -At discharge today, was switched to oral Augmentin for next 7 days with probiotics.    Oropharyngeal dysphagia: -Confirmed byMBSS with witnessed aspiration of backflowed material.  -SLP evaluations ongoing.Aspiration precautions. Chin-tuck difficult w/cervical spine disease/s/p fusion.  Cardiovascular issues: HTN, CAD, mild demand ischemia -Currently no chest pain.  Heart rate and blood pressure normal. -Home meds include aspirin, statin, metoprolol and lisinopril. -Continue aspirin and statin.  -Metoprolol and lisinopril remain on hold.   Prostate CA: s/p TURP and XRT per pt.  -Continue myrbetriq  Weight loss, failure to thrive: Low prealbumin level -~30lbs weight loss in 3 months.  Nutrition consult appreciated.  Unspecified severity but appears to have protein calorie malnutrition:  -protein supplementation ordered.  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  Progressively declining  mobility -Pain control -Per family, patient  Left maxillary sinus retention cyst or polyp. Bilateral lens replacements. Other: None. IMPRESSION: Suboptimal evaluation due to artifact.  No acute infarction. Electronically Signed   By: Macy Mis M.D.   On: 07/13/2019 15:07   MR Cervical Spine Wo Contrast  Result Date: 07/13/2019 CLINICAL DATA:  Right-sided weakness EXAM: MRI CERVICAL SPINE WITHOUT CONTRAST TECHNIQUE: Multiplanar, multisequence MR imaging of the cervical spine was performed. No intravenous contrast was administered. COMPARISON:  2014 FINDINGS: Alignment: Stable. Vertebrae: Postoperative changes of prior anterior fusion at C3-C5. Hardware is not well evaluated and there is associated susceptibility artifact. No substantial marrow edema. No suspicious osseous lesion. Cord: Abnormal signal is again identified at C4 and C5 levels reflecting myelomalacia. Posterior  Fossa, vertebral arteries, paraspinal tissues: Subcentimeter left thyroid nodule. Disc levels: C2-C3:  Disc bulge.  No significant canal or foraminal stenosis. C3-C4: Operative level. Endplate osteophytes particularly at the C4 level again causing moderate to severe canal stenosis with flattening of the ventral cord. Mild foraminal stenosis is similar. C4-C5: Operative level. Endplate osteophytes and facet and uncovertebral hypertrophy. Moderate to marked canal canal stenosis. Left greater than right moderate to marked foraminal stenosis. Appearance is similar. C5-C6: Endplate osteophytes and facet and uncovertebral hypertrophy. Mild canal and foraminal stenosis. Appearance is similar. C6-C7: Bridging endplate osteophytes and left greater than right facet and uncovertebral hypertrophy. No canal stenosis. Probable mild foraminal stenosis. Appearance is similar. C7-T1: Disc bulge, endplate osteophytes, and facet hypertrophy. No canal stenosis. Mild foraminal stenosis. Appearance is similar. IMPRESSION: Operative and degenerative changes as detailed above. Suboptimal stenosis evaluation due to artifact. However, appearance is similar to the prior study. Canal stenosis remains greatest at C3-C4 and C4-C5. Foraminal stenosis greatest at C4-C5. Stable abnormal cord signal at C4 and C5 reflecting myelomalacia. Electronically Signed   By: Macy Mis M.D.   On: 07/13/2019 15:00   DG Chest Port 1 View  Result Date: 07/12/2019 CLINICAL DATA:  Hypothermia EXAM: PORTABLE CHEST 1 VIEW COMPARISON:  08/21/2017 FINDINGS: The heart size and mediastinal contours are within normal limits. Both lungs are clear. The visualized skeletal structures are unremarkable. IMPRESSION: No active disease. Electronically Signed   By: Ulyses Jarred M.D.   On: 07/12/2019 19:01   DG Swallowing Func-Speech Pathology  Result Date: 07/13/2019 Objective Swallowing Evaluation: Type of Study: MBS-Modified Barium Swallow Study  Patient Details  Name: Seth Mccall MRN: [id number] Date of Birth: February 16, 1930 Today's Date: 07/13/2019 Time: SLP Start Time (ACUTE ONLY): 1310 -SLP Stop Time (ACUTE ONLY): 1339 SLP Time Calculation (min) (ACUTE ONLY): 29 min Past Medical History: Past Medical History: Diagnosis Date . Arthritis  . Complication of anesthesia   limited movement in neck because of fusion . Gout  . Hypertension  . Prostate cancer [facility])  Past Surgical History: Past Surgical History: Procedure Laterality Date . BACK SURGERY  2005  fusion of C3, C4, and C5 . CATARACT EXTRACTION Bilateral  . CORONARY ATHERECTOMY N/A 08/12/2017  Procedure: CORONARY ATHERECTOMY;  Surgeon: Adrian Prows, MD;  Location: [place] [facility];  Service: Cardiovascular;  Laterality: N/A; . CORONARY STENT INTERVENTION N/A 08/12/2017  Procedure: CORONARY STENT INTERVENTION;  Surgeon: Adrian Prows, MD;  Location: [place] [facility];  Service: Cardiovascular;  Laterality: N/A; . GREEN LIGHT LASER TURP (TRANSURETHRAL RESECTION OF PROSTATE N/A 10/10/2012  Procedure: GREEN LIGHT LASER TURP (TRANSURETHRAL RESECTION OF PROSTATE;  Surgeon: Claybon Jabs, MD;  Location: [facility];  Service: Urology;  Laterality: N/A; . LEFT HEART CATH AND CORONARY ANGIOGRAPHY N/A 08/12/2017  Procedure: LEFT  Left maxillary sinus retention cyst or polyp. Bilateral lens replacements. Other: None. IMPRESSION: Suboptimal evaluation due to artifact.  No acute infarction. Electronically Signed   By: Macy Mis M.D.   On: 07/13/2019 15:07   MR Cervical Spine Wo Contrast  Result Date: 07/13/2019 CLINICAL DATA:  Right-sided weakness EXAM: MRI CERVICAL SPINE WITHOUT CONTRAST TECHNIQUE: Multiplanar, multisequence MR imaging of the cervical spine was performed. No intravenous contrast was administered. COMPARISON:  2014 FINDINGS: Alignment: Stable. Vertebrae: Postoperative changes of prior anterior fusion at C3-C5. Hardware is not well evaluated and there is associated susceptibility artifact. No substantial marrow edema. No suspicious osseous lesion. Cord: Abnormal signal is again identified at C4 and C5 levels reflecting myelomalacia. Posterior  Fossa, vertebral arteries, paraspinal tissues: Subcentimeter left thyroid nodule. Disc levels: C2-C3:  Disc bulge.  No significant canal or foraminal stenosis. C3-C4: Operative level. Endplate osteophytes particularly at the C4 level again causing moderate to severe canal stenosis with flattening of the ventral cord. Mild foraminal stenosis is similar. C4-C5: Operative level. Endplate osteophytes and facet and uncovertebral hypertrophy. Moderate to marked canal canal stenosis. Left greater than right moderate to marked foraminal stenosis. Appearance is similar. C5-C6: Endplate osteophytes and facet and uncovertebral hypertrophy. Mild canal and foraminal stenosis. Appearance is similar. C6-C7: Bridging endplate osteophytes and left greater than right facet and uncovertebral hypertrophy. No canal stenosis. Probable mild foraminal stenosis. Appearance is similar. C7-T1: Disc bulge, endplate osteophytes, and facet hypertrophy. No canal stenosis. Mild foraminal stenosis. Appearance is similar. IMPRESSION: Operative and degenerative changes as detailed above. Suboptimal stenosis evaluation due to artifact. However, appearance is similar to the prior study. Canal stenosis remains greatest at C3-C4 and C4-C5. Foraminal stenosis greatest at C4-C5. Stable abnormal cord signal at C4 and C5 reflecting myelomalacia. Electronically Signed   By: Macy Mis M.D.   On: 07/13/2019 15:00   DG Chest Port 1 View  Result Date: 07/12/2019 CLINICAL DATA:  Hypothermia EXAM: PORTABLE CHEST 1 VIEW COMPARISON:  08/21/2017 FINDINGS: The heart size and mediastinal contours are within normal limits. Both lungs are clear. The visualized skeletal structures are unremarkable. IMPRESSION: No active disease. Electronically Signed   By: Ulyses Jarred M.D.   On: 07/12/2019 19:01   DG Swallowing Func-Speech Pathology  Result Date: 07/13/2019 Objective Swallowing Evaluation: Type of Study: MBS-Modified Barium Swallow Study  Patient Details  Name: Seth Mccall MRN: [id number] Date of Birth: February 16, 1930 Today's Date: 07/13/2019 Time: SLP Start Time (ACUTE ONLY): 1310 -SLP Stop Time (ACUTE ONLY): 1339 SLP Time Calculation (min) (ACUTE ONLY): 29 min Past Medical History: Past Medical History: Diagnosis Date . Arthritis  . Complication of anesthesia   limited movement in neck because of fusion . Gout  . Hypertension  . Prostate cancer [facility])  Past Surgical History: Past Surgical History: Procedure Laterality Date . BACK SURGERY  2005  fusion of C3, C4, and C5 . CATARACT EXTRACTION Bilateral  . CORONARY ATHERECTOMY N/A 08/12/2017  Procedure: CORONARY ATHERECTOMY;  Surgeon: Adrian Prows, MD;  Location: [place] [facility];  Service: Cardiovascular;  Laterality: N/A; . CORONARY STENT INTERVENTION N/A 08/12/2017  Procedure: CORONARY STENT INTERVENTION;  Surgeon: Adrian Prows, MD;  Location: [place] [facility];  Service: Cardiovascular;  Laterality: N/A; . GREEN LIGHT LASER TURP (TRANSURETHRAL RESECTION OF PROSTATE N/A 10/10/2012  Procedure: GREEN LIGHT LASER TURP (TRANSURETHRAL RESECTION OF PROSTATE;  Surgeon: Claybon Jabs, MD;  Location: [facility];  Service: Urology;  Laterality: N/A; . LEFT HEART CATH AND CORONARY ANGIOGRAPHY N/A 08/12/2017  Procedure: LEFT  Left maxillary sinus retention cyst or polyp. Bilateral lens replacements. Other: None. IMPRESSION: Suboptimal evaluation due to artifact.  No acute infarction. Electronically Signed   By: Macy Mis M.D.   On: 07/13/2019 15:07   MR Cervical Spine Wo Contrast  Result Date: 07/13/2019 CLINICAL DATA:  Right-sided weakness EXAM: MRI CERVICAL SPINE WITHOUT CONTRAST TECHNIQUE: Multiplanar, multisequence MR imaging of the cervical spine was performed. No intravenous contrast was administered. COMPARISON:  2014 FINDINGS: Alignment: Stable. Vertebrae: Postoperative changes of prior anterior fusion at C3-C5. Hardware is not well evaluated and there is associated susceptibility artifact. No substantial marrow edema. No suspicious osseous lesion. Cord: Abnormal signal is again identified at C4 and C5 levels reflecting myelomalacia. Posterior  Fossa, vertebral arteries, paraspinal tissues: Subcentimeter left thyroid nodule. Disc levels: C2-C3:  Disc bulge.  No significant canal or foraminal stenosis. C3-C4: Operative level. Endplate osteophytes particularly at the C4 level again causing moderate to severe canal stenosis with flattening of the ventral cord. Mild foraminal stenosis is similar. C4-C5: Operative level. Endplate osteophytes and facet and uncovertebral hypertrophy. Moderate to marked canal canal stenosis. Left greater than right moderate to marked foraminal stenosis. Appearance is similar. C5-C6: Endplate osteophytes and facet and uncovertebral hypertrophy. Mild canal and foraminal stenosis. Appearance is similar. C6-C7: Bridging endplate osteophytes and left greater than right facet and uncovertebral hypertrophy. No canal stenosis. Probable mild foraminal stenosis. Appearance is similar. C7-T1: Disc bulge, endplate osteophytes, and facet hypertrophy. No canal stenosis. Mild foraminal stenosis. Appearance is similar. IMPRESSION: Operative and degenerative changes as detailed above. Suboptimal stenosis evaluation due to artifact. However, appearance is similar to the prior study. Canal stenosis remains greatest at C3-C4 and C4-C5. Foraminal stenosis greatest at C4-C5. Stable abnormal cord signal at C4 and C5 reflecting myelomalacia. Electronically Signed   By: Macy Mis M.D.   On: 07/13/2019 15:00   DG Chest Port 1 View  Result Date: 07/12/2019 CLINICAL DATA:  Hypothermia EXAM: PORTABLE CHEST 1 VIEW COMPARISON:  08/21/2017 FINDINGS: The heart size and mediastinal contours are within normal limits. Both lungs are clear. The visualized skeletal structures are unremarkable. IMPRESSION: No active disease. Electronically Signed   By: Ulyses Jarred M.D.   On: 07/12/2019 19:01   DG Swallowing Func-Speech Pathology  Result Date: 07/13/2019 Objective Swallowing Evaluation: Type of Study: MBS-Modified Barium Swallow Study  Patient Details  Name: Seth Mccall MRN: [id number] Date of Birth: February 16, 1930 Today's Date: 07/13/2019 Time: SLP Start Time (ACUTE ONLY): 1310 -SLP Stop Time (ACUTE ONLY): 1339 SLP Time Calculation (min) (ACUTE ONLY): 29 min Past Medical History: Past Medical History: Diagnosis Date . Arthritis  . Complication of anesthesia   limited movement in neck because of fusion . Gout  . Hypertension  . Prostate cancer [facility])  Past Surgical History: Past Surgical History: Procedure Laterality Date . BACK SURGERY  2005  fusion of C3, C4, and C5 . CATARACT EXTRACTION Bilateral  . CORONARY ATHERECTOMY N/A 08/12/2017  Procedure: CORONARY ATHERECTOMY;  Surgeon: Adrian Prows, MD;  Location: [place] [facility];  Service: Cardiovascular;  Laterality: N/A; . CORONARY STENT INTERVENTION N/A 08/12/2017  Procedure: CORONARY STENT INTERVENTION;  Surgeon: Adrian Prows, MD;  Location: [place] [facility];  Service: Cardiovascular;  Laterality: N/A; . GREEN LIGHT LASER TURP (TRANSURETHRAL RESECTION OF PROSTATE N/A 10/10/2012  Procedure: GREEN LIGHT LASER TURP (TRANSURETHRAL RESECTION OF PROSTATE;  Surgeon: Claybon Jabs, MD;  Location: [facility];  Service: Urology;  Laterality: N/A; . LEFT HEART CATH AND CORONARY ANGIOGRAPHY N/A 08/12/2017  Procedure: LEFT  Left maxillary sinus retention cyst or polyp. Bilateral lens replacements. Other: None. IMPRESSION: Suboptimal evaluation due to artifact.  No acute infarction. Electronically Signed   By: Macy Mis M.D.   On: 07/13/2019 15:07   MR Cervical Spine Wo Contrast  Result Date: 07/13/2019 CLINICAL DATA:  Right-sided weakness EXAM: MRI CERVICAL SPINE WITHOUT CONTRAST TECHNIQUE: Multiplanar, multisequence MR imaging of the cervical spine was performed. No intravenous contrast was administered. COMPARISON:  2014 FINDINGS: Alignment: Stable. Vertebrae: Postoperative changes of prior anterior fusion at C3-C5. Hardware is not well evaluated and there is associated susceptibility artifact. No substantial marrow edema. No suspicious osseous lesion. Cord: Abnormal signal is again identified at C4 and C5 levels reflecting myelomalacia. Posterior  Fossa, vertebral arteries, paraspinal tissues: Subcentimeter left thyroid nodule. Disc levels: C2-C3:  Disc bulge.  No significant canal or foraminal stenosis. C3-C4: Operative level. Endplate osteophytes particularly at the C4 level again causing moderate to severe canal stenosis with flattening of the ventral cord. Mild foraminal stenosis is similar. C4-C5: Operative level. Endplate osteophytes and facet and uncovertebral hypertrophy. Moderate to marked canal canal stenosis. Left greater than right moderate to marked foraminal stenosis. Appearance is similar. C5-C6: Endplate osteophytes and facet and uncovertebral hypertrophy. Mild canal and foraminal stenosis. Appearance is similar. C6-C7: Bridging endplate osteophytes and left greater than right facet and uncovertebral hypertrophy. No canal stenosis. Probable mild foraminal stenosis. Appearance is similar. C7-T1: Disc bulge, endplate osteophytes, and facet hypertrophy. No canal stenosis. Mild foraminal stenosis. Appearance is similar. IMPRESSION: Operative and degenerative changes as detailed above. Suboptimal stenosis evaluation due to artifact. However, appearance is similar to the prior study. Canal stenosis remains greatest at C3-C4 and C4-C5. Foraminal stenosis greatest at C4-C5. Stable abnormal cord signal at C4 and C5 reflecting myelomalacia. Electronically Signed   By: Macy Mis M.D.   On: 07/13/2019 15:00   DG Chest Port 1 View  Result Date: 07/12/2019 CLINICAL DATA:  Hypothermia EXAM: PORTABLE CHEST 1 VIEW COMPARISON:  08/21/2017 FINDINGS: The heart size and mediastinal contours are within normal limits. Both lungs are clear. The visualized skeletal structures are unremarkable. IMPRESSION: No active disease. Electronically Signed   By: Ulyses Jarred M.D.   On: 07/12/2019 19:01   DG Swallowing Func-Speech Pathology  Result Date: 07/13/2019 Objective Swallowing Evaluation: Type of Study: MBS-Modified Barium Swallow Study  Patient Details  Name: Seth Mccall MRN: [id number] Date of Birth: February 16, 1930 Today's Date: 07/13/2019 Time: SLP Start Time (ACUTE ONLY): 1310 -SLP Stop Time (ACUTE ONLY): 1339 SLP Time Calculation (min) (ACUTE ONLY): 29 min Past Medical History: Past Medical History: Diagnosis Date . Arthritis  . Complication of anesthesia   limited movement in neck because of fusion . Gout  . Hypertension  . Prostate cancer [facility])  Past Surgical History: Past Surgical History: Procedure Laterality Date . BACK SURGERY  2005  fusion of C3, C4, and C5 . CATARACT EXTRACTION Bilateral  . CORONARY ATHERECTOMY N/A 08/12/2017  Procedure: CORONARY ATHERECTOMY;  Surgeon: Adrian Prows, MD;  Location: [place] [facility];  Service: Cardiovascular;  Laterality: N/A; . CORONARY STENT INTERVENTION N/A 08/12/2017  Procedure: CORONARY STENT INTERVENTION;  Surgeon: Adrian Prows, MD;  Location: [place] [facility];  Service: Cardiovascular;  Laterality: N/A; . GREEN LIGHT LASER TURP (TRANSURETHRAL RESECTION OF PROSTATE N/A 10/10/2012  Procedure: GREEN LIGHT LASER TURP (TRANSURETHRAL RESECTION OF PROSTATE;  Surgeon: Claybon Jabs, MD;  Location: [facility];  Service: Urology;  Laterality: N/A; . LEFT HEART CATH AND CORONARY ANGIOGRAPHY N/A 08/12/2017  Procedure: LEFT  Left maxillary sinus retention cyst or polyp. Bilateral lens replacements. Other: None. IMPRESSION: Suboptimal evaluation due to artifact.  No acute infarction. Electronically Signed   By: Macy Mis M.D.   On: 07/13/2019 15:07   MR Cervical Spine Wo Contrast  Result Date: 07/13/2019 CLINICAL DATA:  Right-sided weakness EXAM: MRI CERVICAL SPINE WITHOUT CONTRAST TECHNIQUE: Multiplanar, multisequence MR imaging of the cervical spine was performed. No intravenous contrast was administered. COMPARISON:  2014 FINDINGS: Alignment: Stable. Vertebrae: Postoperative changes of prior anterior fusion at C3-C5. Hardware is not well evaluated and there is associated susceptibility artifact. No substantial marrow edema. No suspicious osseous lesion. Cord: Abnormal signal is again identified at C4 and C5 levels reflecting myelomalacia. Posterior  Fossa, vertebral arteries, paraspinal tissues: Subcentimeter left thyroid nodule. Disc levels: C2-C3:  Disc bulge.  No significant canal or foraminal stenosis. C3-C4: Operative level. Endplate osteophytes particularly at the C4 level again causing moderate to severe canal stenosis with flattening of the ventral cord. Mild foraminal stenosis is similar. C4-C5: Operative level. Endplate osteophytes and facet and uncovertebral hypertrophy. Moderate to marked canal canal stenosis. Left greater than right moderate to marked foraminal stenosis. Appearance is similar. C5-C6: Endplate osteophytes and facet and uncovertebral hypertrophy. Mild canal and foraminal stenosis. Appearance is similar. C6-C7: Bridging endplate osteophytes and left greater than right facet and uncovertebral hypertrophy. No canal stenosis. Probable mild foraminal stenosis. Appearance is similar. C7-T1: Disc bulge, endplate osteophytes, and facet hypertrophy. No canal stenosis. Mild foraminal stenosis. Appearance is similar. IMPRESSION: Operative and degenerative changes as detailed above. Suboptimal stenosis evaluation due to artifact. However, appearance is similar to the prior study. Canal stenosis remains greatest at C3-C4 and C4-C5. Foraminal stenosis greatest at C4-C5. Stable abnormal cord signal at C4 and C5 reflecting myelomalacia. Electronically Signed   By: Macy Mis M.D.   On: 07/13/2019 15:00   DG Chest Port 1 View  Result Date: 07/12/2019 CLINICAL DATA:  Hypothermia EXAM: PORTABLE CHEST 1 VIEW COMPARISON:  08/21/2017 FINDINGS: The heart size and mediastinal contours are within normal limits. Both lungs are clear. The visualized skeletal structures are unremarkable. IMPRESSION: No active disease. Electronically Signed   By: Ulyses Jarred M.D.   On: 07/12/2019 19:01   DG Swallowing Func-Speech Pathology  Result Date: 07/13/2019 Objective Swallowing Evaluation: Type of Study: MBS-Modified Barium Swallow Study  Patient Details  Name: Seth Mccall MRN: [id number] Date of Birth: February 16, 1930 Today's Date: 07/13/2019 Time: SLP Start Time (ACUTE ONLY): 1310 -SLP Stop Time (ACUTE ONLY): 1339 SLP Time Calculation (min) (ACUTE ONLY): 29 min Past Medical History: Past Medical History: Diagnosis Date . Arthritis  . Complication of anesthesia   limited movement in neck because of fusion . Gout  . Hypertension  . Prostate cancer [facility])  Past Surgical History: Past Surgical History: Procedure Laterality Date . BACK SURGERY  2005  fusion of C3, C4, and C5 . CATARACT EXTRACTION Bilateral  . CORONARY ATHERECTOMY N/A 08/12/2017  Procedure: CORONARY ATHERECTOMY;  Surgeon: Adrian Prows, MD;  Location: [place] [facility];  Service: Cardiovascular;  Laterality: N/A; . CORONARY STENT INTERVENTION N/A 08/12/2017  Procedure: CORONARY STENT INTERVENTION;  Surgeon: Adrian Prows, MD;  Location: [place] [facility];  Service: Cardiovascular;  Laterality: N/A; . GREEN LIGHT LASER TURP (TRANSURETHRAL RESECTION OF PROSTATE N/A 10/10/2012  Procedure: GREEN LIGHT LASER TURP (TRANSURETHRAL RESECTION OF PROSTATE;  Surgeon: Claybon Jabs, MD;  Location: [facility];  Service: Urology;  Laterality: N/A; . LEFT HEART CATH AND CORONARY ANGIOGRAPHY N/A 08/12/2017  Procedure: LEFT

## 2019-07-21 LAB — CULTURE, BLOOD (ROUTINE X 2): Culture: NO GROWTH

## 2019-09-19 DEATH — deceased

## 2019-12-11 ENCOUNTER — Ambulatory Visit: Payer: Medicare HMO | Admitting: Cardiology

## 2020-07-09 IMAGING — MR MR HEAD W/O CM
9 of 10 series · 35 of 48 positions shown · non-contrast
Comparison: None.

CLINICAL DATA: Weakness and altered mental status

EXAM:
MRI HEAD WITHOUT CONTRAST
TECHNIQUE: Multiplanar, multiecho pulse sequences of the brain and surrounding
structures were obtained without intravenous contrast.

[Series 5: T2 · sagittal · 5.0mm · 0.47mm/px · 3 of 24 slices shown (1 of 2)]
[im 1/24]
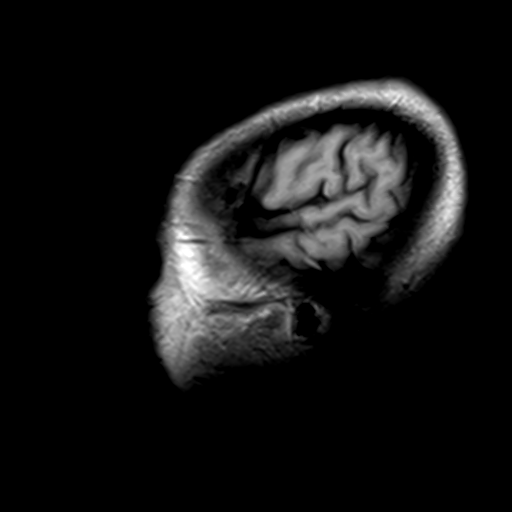
[im 12/24]
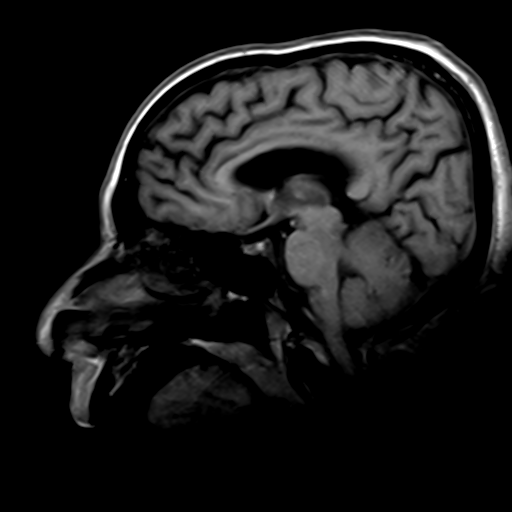
[im 24/24]
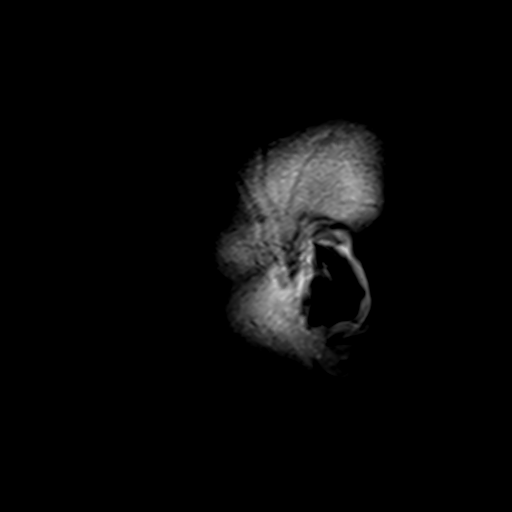

[Series 6: T2 · axial · 5.0mm · 0.45mm/px · z∈[-18,+110]mm · 3 of 23 slices shown (2 of 2)]
[im 1/23]
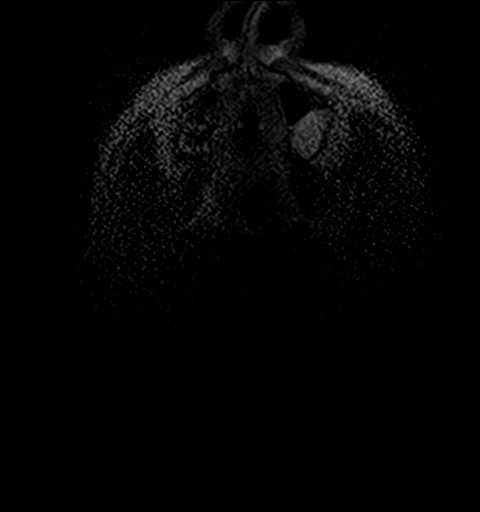
[im 12/23]
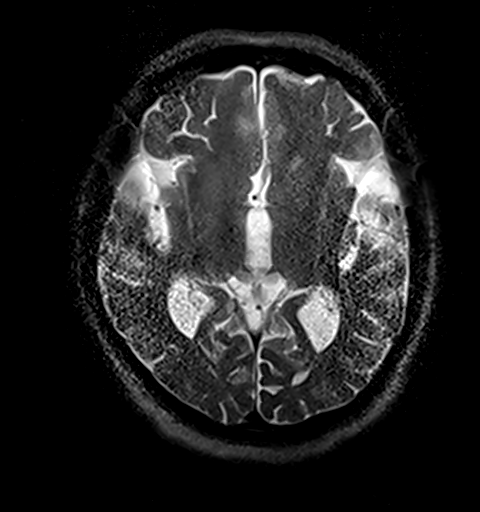
[im 23/23]
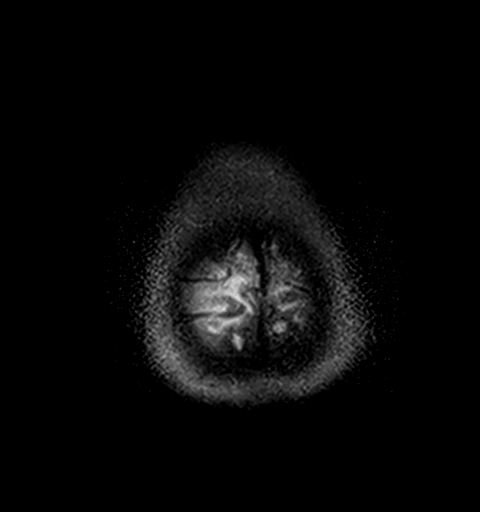

[Series 7: dwi_tracew · axial · 5.0mm · 1.08mm/px · z∈[-11,+128]mm · 6 of 50 slices shown (1 of 2)]
[im 1/50]
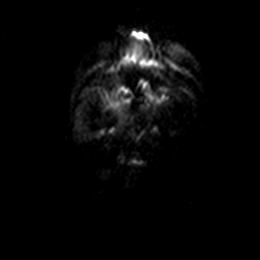
[im 10/50]
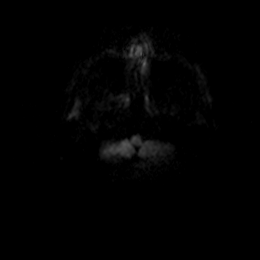
[im 20/50]
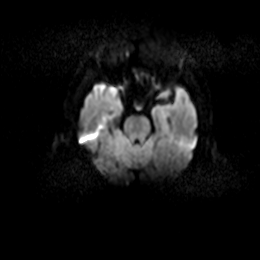
[im 30/50]
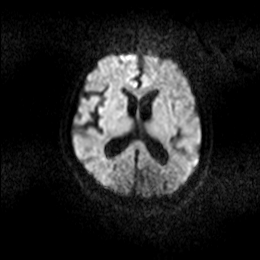
[im 40/50]
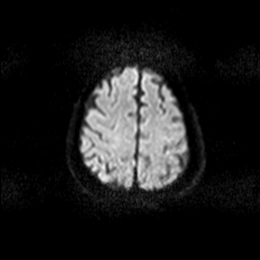
[im 50/50]
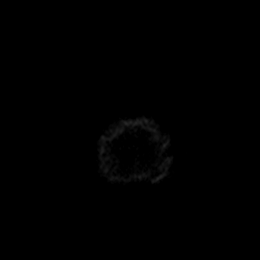

[Series 8: dwi_adc · axial · 5.0mm · 1.08mm/px · z∈[-11,+128]mm · 3 of 25 slices shown (1 of 2)]
[im 1/25]
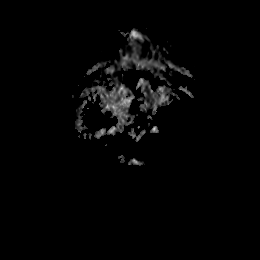
[im 13/25]
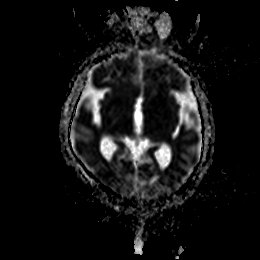
[im 25/25]
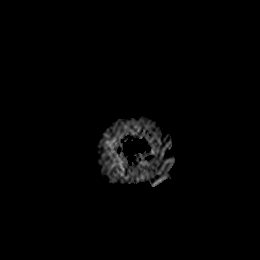

[Series 9: GRE · axial · 5.0mm · 0.45mm/px · z∈[-24,+115]mm · 3 of 25 slices shown]
[im 1/25]
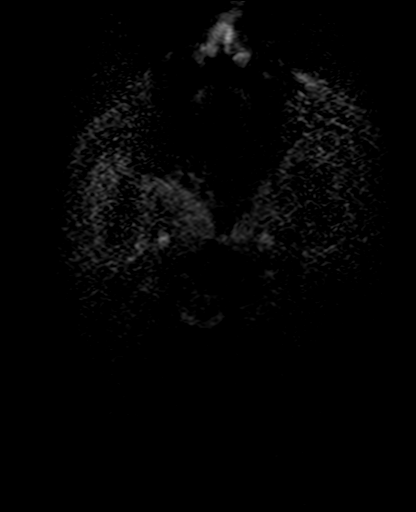
[im 13/25]
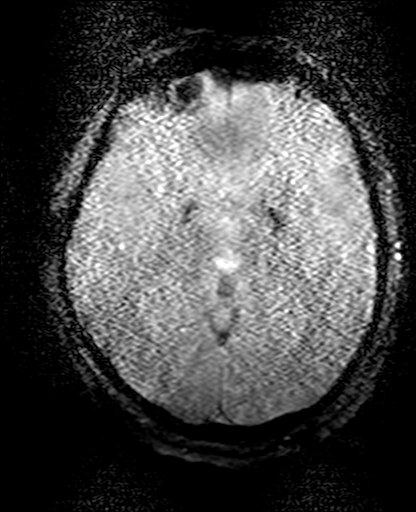
[im 25/25]
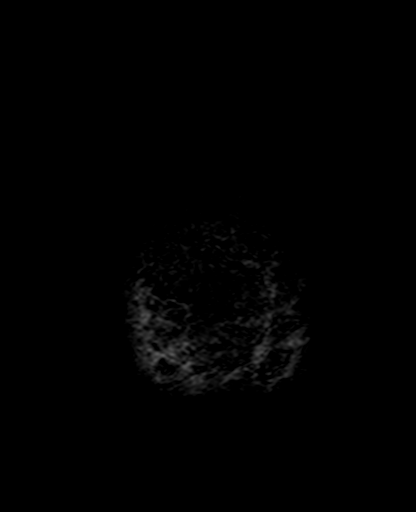

[Series 10: FLAIR · axial · 3.0mm · 0.86mm/px · z∈[-12,+109]mm · 5 of 46 slices shown]
[im 1/46]
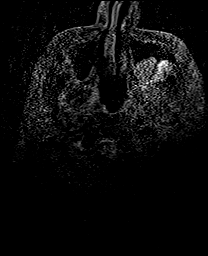
[im 12/46]
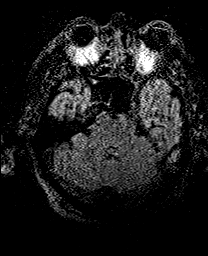
[im 23/46]
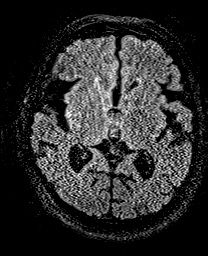
[im 34/46]
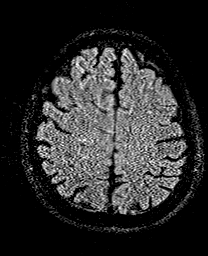
[im 46/46]
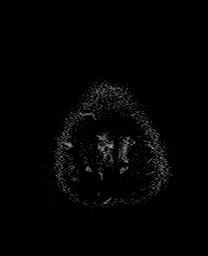

[Series 11: T1 · axial · 5.0mm · 0.45mm/px · z∈[-24,+115]mm · 3 of 25 slices shown]
[im 1/25]
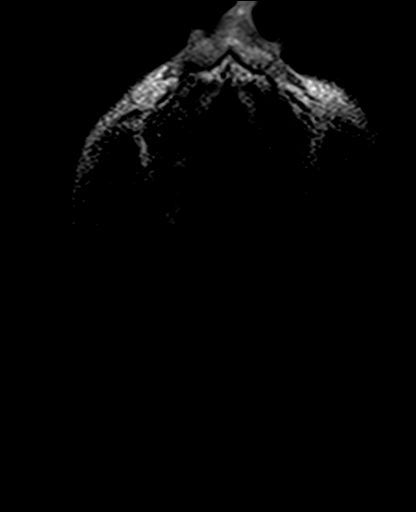
[im 13/25]
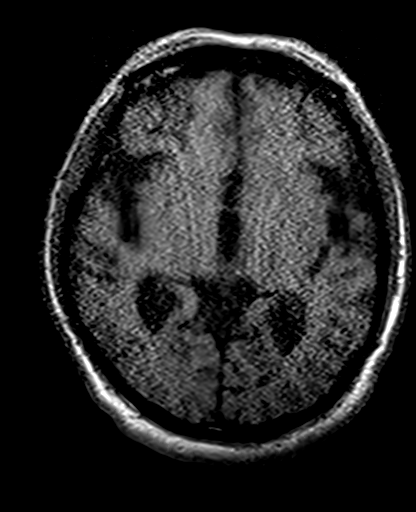
[im 25/25]
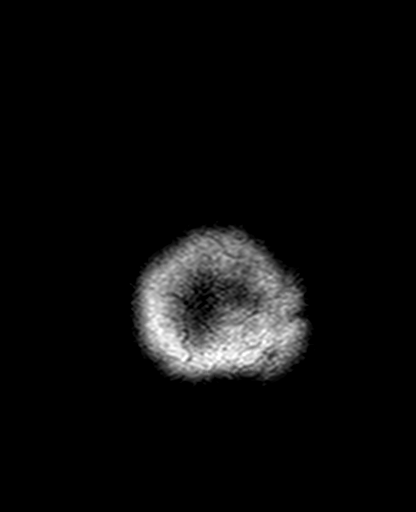

[Series 13: dwi_tracew · coronal · 5.0mm · 1.08mm/px · 6 of 54 slices shown (2 of 2)]
[im 1/54]
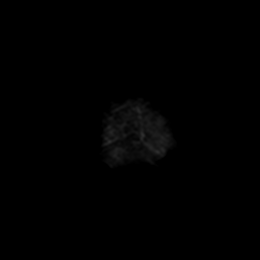
[im 11/54]
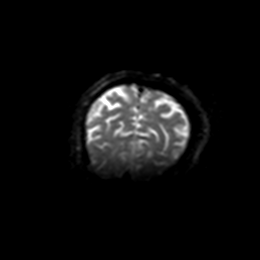
[im 22/54]
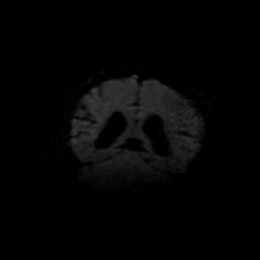
[im 32/54]
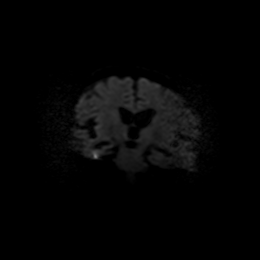
[im 43/54]
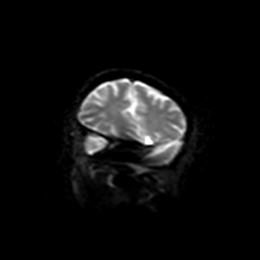
[im 54/54]
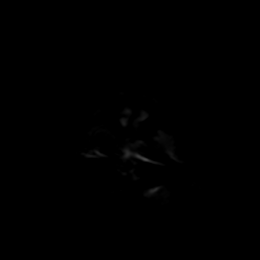

[Series 14: dwi_adc · coronal · 5.0mm · 1.08mm/px · 3 of 27 slices shown (2 of 2)]
[im 1/27]
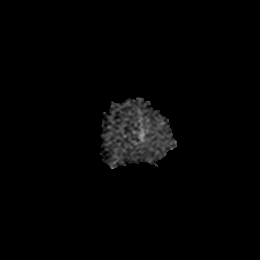
[im 14/27]
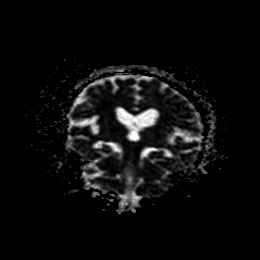
[im 27/27]
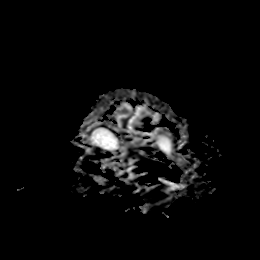

[35 of 48 positions shown; findings below may reference images not displayed]

FINDINGS: Artifact is present.

Brain: There is no acute infarction or hemorrhage within the above
limitation. Patchy T2 hyperintensity in the supratentorial white
matter is nonspecific but may reflect mild chronic microvascular
ischemic changes. Prominence of the ventricles and sulci reflects
mild parenchymal volume loss. No intracranial mass or mass effect
identified.

Vascular: Major vessel flow voids where visualized are preserved.

Skull and upper cervical spine: Unremarkable.

Sinuses/Orbits: Left maxillary sinus retention cyst or polyp.
Bilateral lens replacements.

Other: None.
IMPRESSION: Suboptimal evaluation due to artifact.  No acute infarction.

## 2020-07-09 IMAGING — MR MR CERVICAL SPINE W/O CM
5 series · 42 of 48 positions shown · non-contrast
Comparison: [id number]

CLINICAL DATA: Right-sided weakness

EXAM:
MRI CERVICAL SPINE WITHOUT CONTRAST
TECHNIQUE: Multiplanar, multisequence MR imaging of the cervical spine was
performed. No intravenous contrast was administered.

[Series 5: T1 · sagittal · 3.0mm · 0.94mm/px · 6 of 15 slices shown (1 of 2)]
[im 1/15]
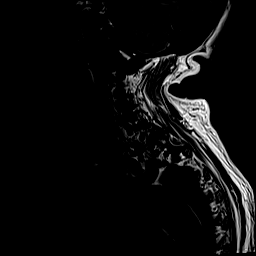
[im 3/15]
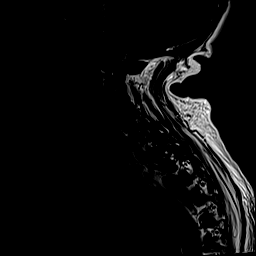
[im 6/15]
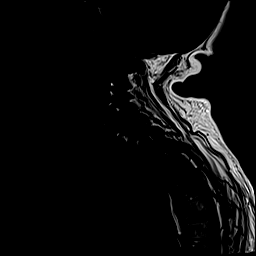
[im 9/15]
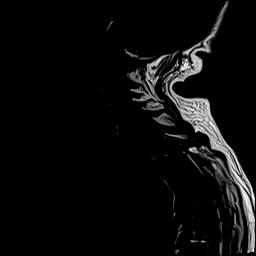
[im 12/15]
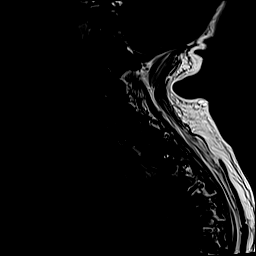
[im 15/15]
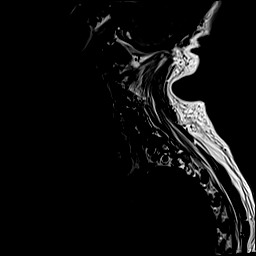

[Series 6: T2 · sagittal · 3.0mm · 0.94mm/px · 7 of 15 slices shown (1 of 2)]
[im 1/15]
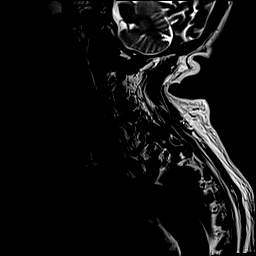
[im 3/15]
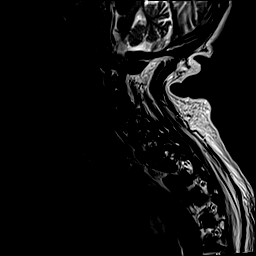
[im 5/15]
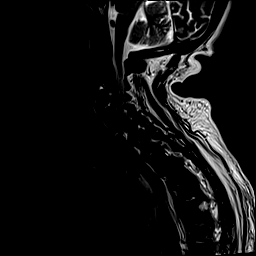
[im 8/15]
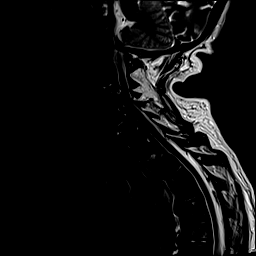
[im 10/15]
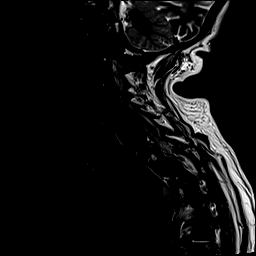
[im 12/15]
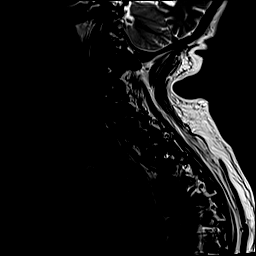
[im 15/15]
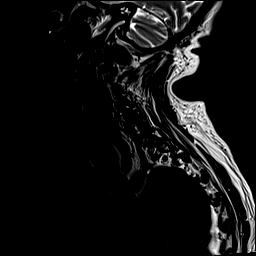

[Series 7: STIR · sagittal · 3.0mm · 0.94mm/px · 7 of 15 slices shown]
[im 1/15]
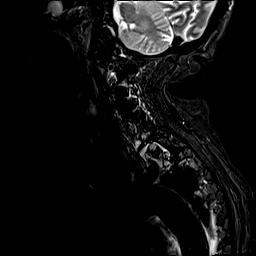
[im 3/15]
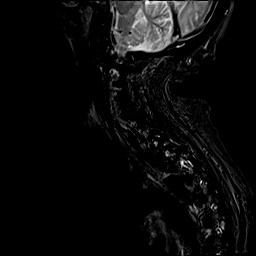
[im 5/15]
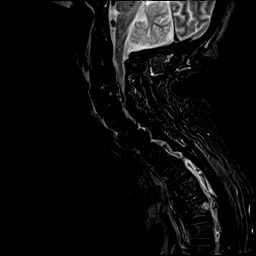
[im 8/15]
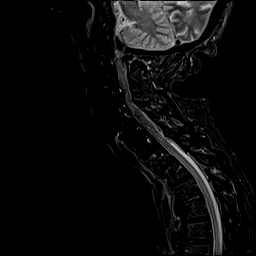
[im 10/15]
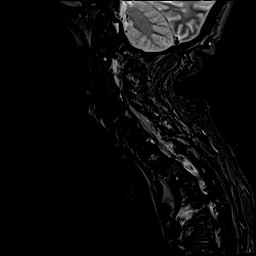
[im 12/15]
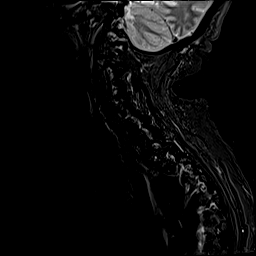
[im 15/15]
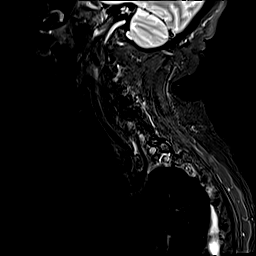

[Series 8: T2 · axial · 4.0mm · 0.74mm/px · z∈[-98,+34]mm · 14 of 32 slices shown (2 of 2)]
[im 1/32]
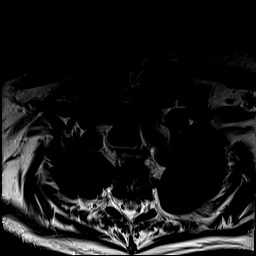
[im 3/32]
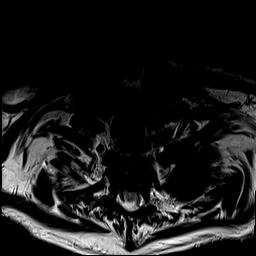
[im 5/32]
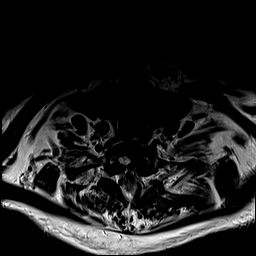
[im 8/32]
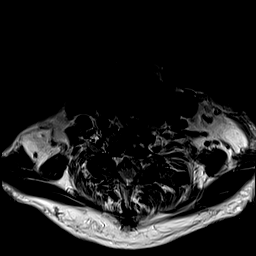
[im 10/32]
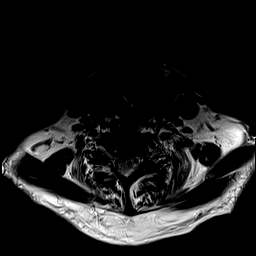
[im 12/32]
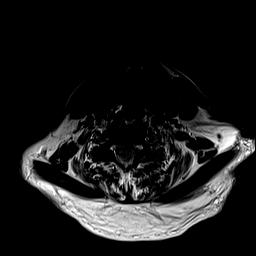
[im 15/32]
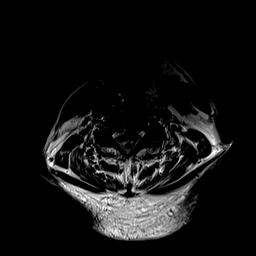
[im 17/32]
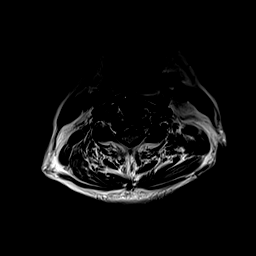
[im 20/32]
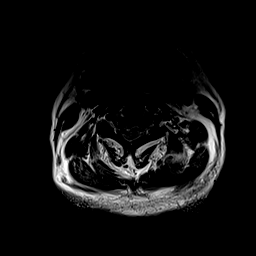
[im 22/32]
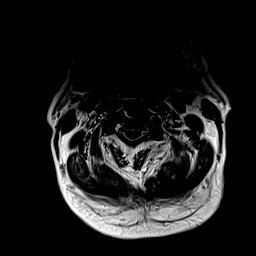
[im 24/32]
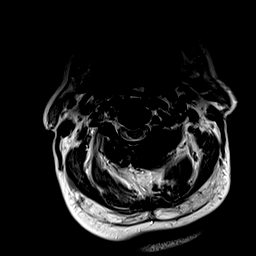
[im 27/32]
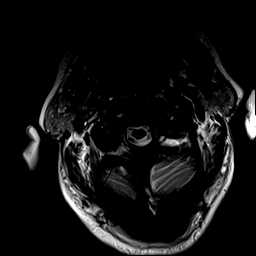
[im 29/32]
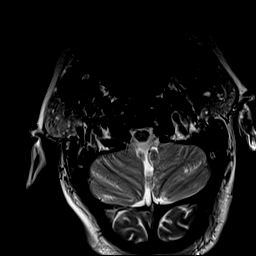
[im 32/32]
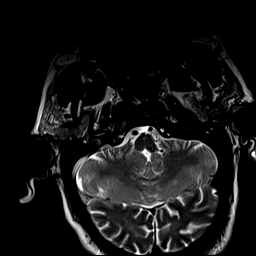

[Series 9: T1 · axial · 4.0mm · 0.37mm/px · z∈[-98,+34]mm · 8 of 32 slices shown (2 of 2)]
[im 1/32]
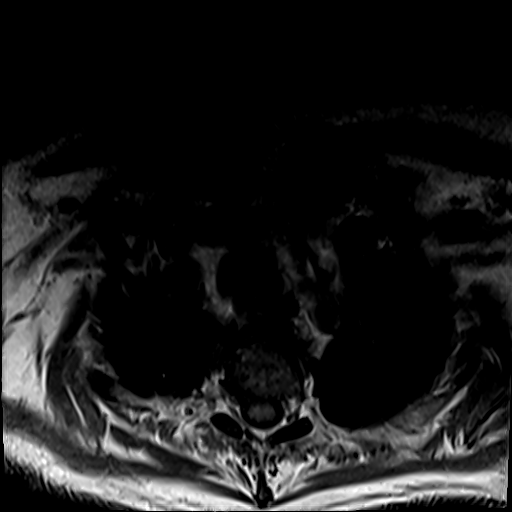
[im 5/32]
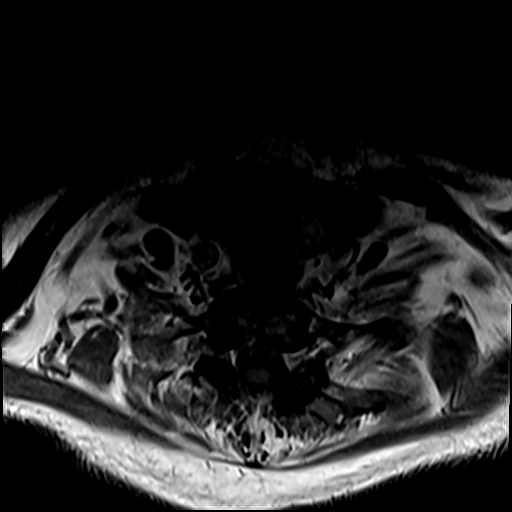
[im 10/32]
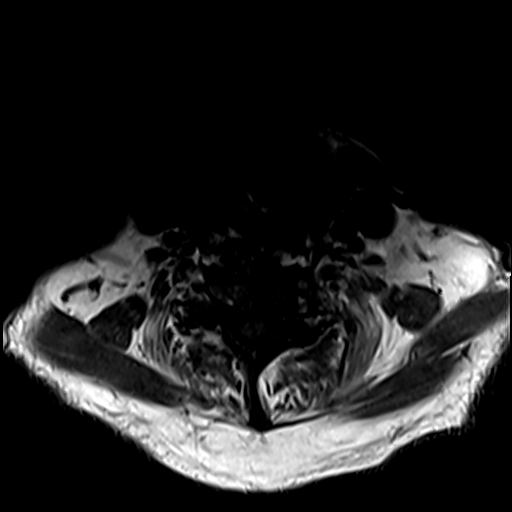
[im 15/32]
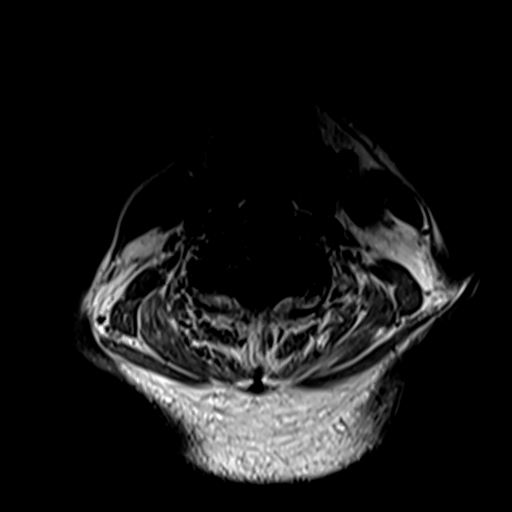
[im 17/32]
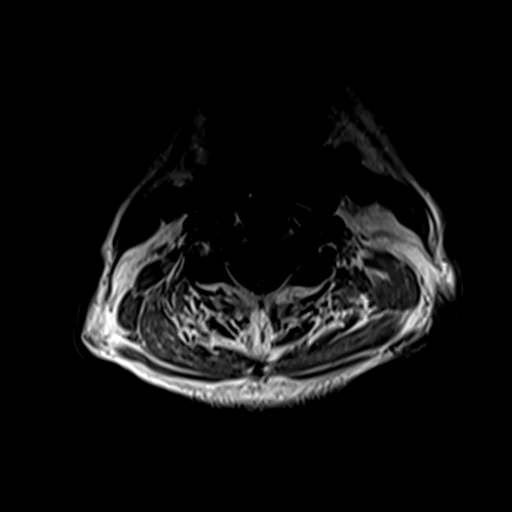
[im 22/32]
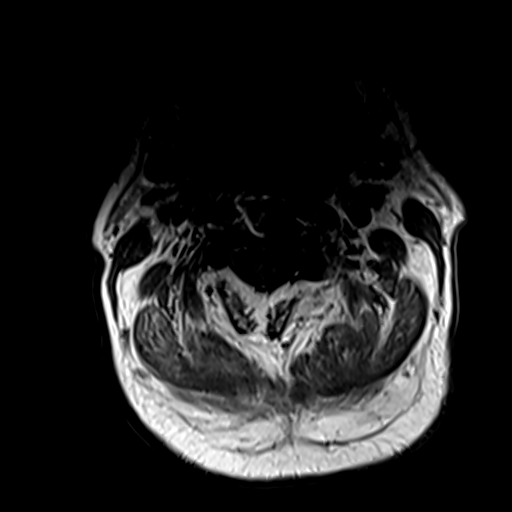
[im 27/32]
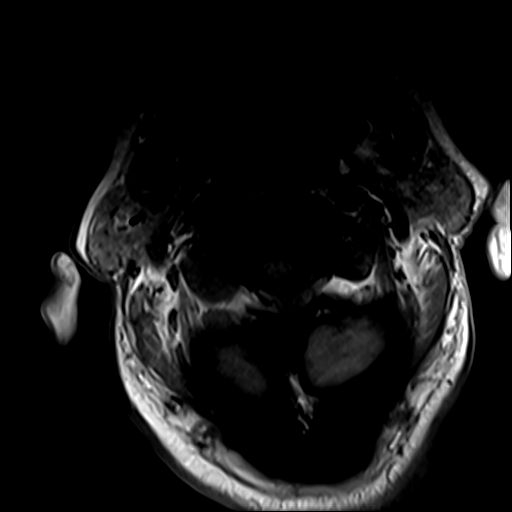
[im 32/32]
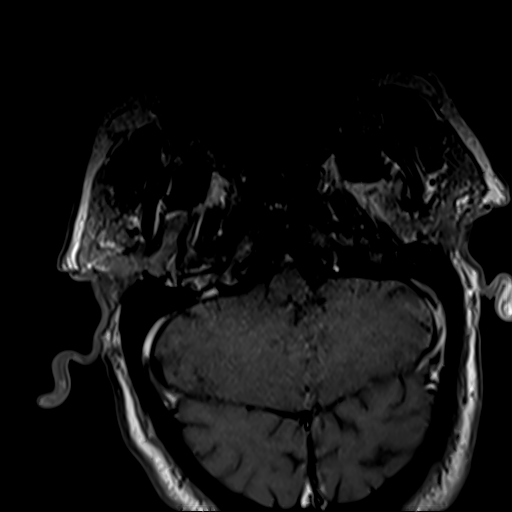

[42 of 48 positions shown; findings below may reference images not displayed]

FINDINGS: Alignment: Stable.

Vertebrae: Postoperative changes of prior anterior fusion at C3-C5.
Hardware is not well evaluated and there is associated
susceptibility artifact. No substantial marrow edema. No suspicious
osseous lesion.

Cord: Abnormal signal is again identified at C4 and C5 levels
reflecting myelomalacia.

Posterior Fossa, vertebral arteries, paraspinal tissues:
Subcentimeter left thyroid nodule.

Disc levels:

C2-C3:  Disc bulge.  No significant canal or foraminal stenosis.

C3-C4: Operative level. Endplate osteophytes particularly at the C4
level again causing moderate to severe canal stenosis with
flattening of the ventral cord. Mild foraminal stenosis is similar.

C4-C5: Operative level. Endplate osteophytes and facet and
uncovertebral hypertrophy. Moderate to marked canal canal stenosis.
Left greater than right moderate to marked foraminal stenosis.
Appearance is similar.

C5-C6: Endplate osteophytes and facet and uncovertebral hypertrophy.
Mild canal and foraminal stenosis. Appearance is similar.

C6-C7: Bridging endplate osteophytes and left greater than right
facet and uncovertebral hypertrophy. No canal stenosis. Probable
mild foraminal stenosis. Appearance is similar.

C7-T1: Disc bulge, endplate osteophytes, and facet hypertrophy. No
canal stenosis. Mild foraminal stenosis. Appearance is similar.
IMPRESSION: Operative and degenerative changes as detailed above. Suboptimal
stenosis evaluation due to artifact. However, appearance is similar
to the prior study. Canal stenosis remains greatest at C3-C4 and
C4-C5. Foraminal stenosis greatest at C4-C5.

Stable abnormal cord signal at C4 and C5 reflecting myelomalacia.
